# Patient Record
Sex: Female | Born: 1945
Health system: Southern US, Community
[De-identification: ages and names within clinical notes are randomized; demographics above are authoritative.]

## PROBLEM LIST (undated history)

## (undated) DIAGNOSIS — R55 Syncope and collapse: Secondary | ICD-10-CM

## (undated) DIAGNOSIS — Z8739 Personal history of other diseases of the musculoskeletal system and connective tissue: Secondary | ICD-10-CM

## (undated) DIAGNOSIS — D649 Anemia, unspecified: Secondary | ICD-10-CM

## (undated) DIAGNOSIS — K219 Gastro-esophageal reflux disease without esophagitis: Secondary | ICD-10-CM

## (undated) DIAGNOSIS — R0600 Dyspnea, unspecified: Secondary | ICD-10-CM

## (undated) DIAGNOSIS — R7303 Prediabetes: Secondary | ICD-10-CM

## (undated) DIAGNOSIS — C801 Malignant (primary) neoplasm, unspecified: Secondary | ICD-10-CM

## (undated) DIAGNOSIS — M810 Age-related osteoporosis without current pathological fracture: Secondary | ICD-10-CM

## (undated) DIAGNOSIS — J45909 Unspecified asthma, uncomplicated: Secondary | ICD-10-CM

## (undated) DIAGNOSIS — E669 Obesity, unspecified: Secondary | ICD-10-CM

## (undated) DIAGNOSIS — I10 Essential (primary) hypertension: Secondary | ICD-10-CM

## (undated) DIAGNOSIS — J449 Chronic obstructive pulmonary disease, unspecified: Secondary | ICD-10-CM

## (undated) DIAGNOSIS — C3490 Malignant neoplasm of unspecified part of unspecified bronchus or lung: Secondary | ICD-10-CM

## (undated) DIAGNOSIS — E559 Vitamin D deficiency, unspecified: Secondary | ICD-10-CM

## (undated) DIAGNOSIS — M1990 Unspecified osteoarthritis, unspecified site: Secondary | ICD-10-CM

## (undated) HISTORY — DX: Essential (primary) hypertension: I10

## (undated) HISTORY — DX: Gastro-esophageal reflux disease without esophagitis: K21.9

## (undated) HISTORY — DX: Syncope and collapse: R55

## (undated) HISTORY — PX: OOPHORECTOMY: SHX86

## (undated) HISTORY — PX: ESOPHAGOGASTRODUODENOSCOPY: SHX1529

## (undated) HISTORY — DX: Personal history of other diseases of the musculoskeletal system and connective tissue: Z87.39

## (undated) HISTORY — DX: Obesity, unspecified: E66.9

## (undated) HISTORY — DX: Age-related osteoporosis without current pathological fracture: M81.0

## (undated) HISTORY — DX: Vitamin D deficiency, unspecified: E55.9

## (undated) HISTORY — PX: ABDOMINAL HYSTERECTOMY: SHX81

## (undated) HISTORY — DX: Unspecified asthma, uncomplicated: J45.909

## (undated) HISTORY — DX: Chronic obstructive pulmonary disease, unspecified: J44.9

---

## 1997-12-14 ENCOUNTER — Other Ambulatory Visit: Admission: RE | Admit: 1997-12-14 | Discharge: 1997-12-14 | Payer: Self-pay | Admitting: Obstetrics and Gynecology

## 1998-02-12 ENCOUNTER — Emergency Department (HOSPITAL_COMMUNITY): Admission: EM | Admit: 1998-02-12 | Discharge: 1998-02-12 | Payer: Self-pay | Admitting: Emergency Medicine

## 1999-01-09 ENCOUNTER — Emergency Department (HOSPITAL_COMMUNITY): Admission: EM | Admit: 1999-01-09 | Discharge: 1999-01-09 | Payer: Self-pay | Admitting: Emergency Medicine

## 1999-08-01 ENCOUNTER — Ambulatory Visit (HOSPITAL_COMMUNITY): Admission: RE | Admit: 1999-08-01 | Discharge: 1999-08-01 | Payer: Self-pay | Admitting: Obstetrics and Gynecology

## 1999-08-01 ENCOUNTER — Encounter: Payer: Self-pay | Admitting: Obstetrics and Gynecology

## 2001-06-23 ENCOUNTER — Encounter: Admission: RE | Admit: 2001-06-23 | Discharge: 2001-06-23 | Payer: Self-pay | Admitting: Internal Medicine

## 2001-06-23 ENCOUNTER — Encounter: Payer: Self-pay | Admitting: Internal Medicine

## 2003-11-18 ENCOUNTER — Emergency Department (HOSPITAL_COMMUNITY): Admission: EM | Admit: 2003-11-18 | Discharge: 2003-11-18 | Payer: Self-pay | Admitting: Emergency Medicine

## 2004-01-16 ENCOUNTER — Ambulatory Visit (HOSPITAL_COMMUNITY): Admission: RE | Admit: 2004-01-16 | Discharge: 2004-01-16 | Payer: Self-pay | Admitting: Internal Medicine

## 2004-01-31 ENCOUNTER — Ambulatory Visit (HOSPITAL_COMMUNITY): Admission: RE | Admit: 2004-01-31 | Discharge: 2004-01-31 | Payer: Self-pay | Admitting: *Deleted

## 2004-05-06 ENCOUNTER — Ambulatory Visit (HOSPITAL_COMMUNITY): Admission: RE | Admit: 2004-05-06 | Discharge: 2004-05-06 | Payer: Self-pay | Admitting: Obstetrics and Gynecology

## 2011-12-03 ENCOUNTER — Other Ambulatory Visit: Payer: Self-pay | Admitting: Internal Medicine

## 2011-12-04 ENCOUNTER — Ambulatory Visit
Admission: RE | Admit: 2011-12-04 | Discharge: 2011-12-04 | Disposition: A | Discharge status: Home / Self Care | Payer: Medicare Other | Source: Ambulatory Visit | Attending: Internal Medicine | Admitting: Internal Medicine

## 2011-12-22 ENCOUNTER — Other Ambulatory Visit: Payer: Self-pay | Admitting: Obstetrics and Gynecology

## 2011-12-22 DIAGNOSIS — N644 Mastodynia: Secondary | ICD-10-CM

## 2011-12-29 ENCOUNTER — Ambulatory Visit
Admission: RE | Admit: 2011-12-29 | Discharge: 2011-12-29 | Disposition: A | Discharge status: Home / Self Care | Payer: Medicare Other | Source: Ambulatory Visit | Attending: Obstetrics and Gynecology | Admitting: Obstetrics and Gynecology

## 2011-12-29 DIAGNOSIS — N644 Mastodynia: Secondary | ICD-10-CM

## 2013-08-31 HISTORY — PX: COLONOSCOPY W/ BIOPSIES: SHX1374

## 2013-11-07 ENCOUNTER — Other Ambulatory Visit: Payer: Self-pay

## 2013-11-07 DIAGNOSIS — Z1231 Encounter for screening mammogram for malignant neoplasm of breast: Secondary | ICD-10-CM

## 2013-11-28 ENCOUNTER — Ambulatory Visit
Admission: RE | Admit: 2013-11-28 | Discharge: 2013-11-28 | Disposition: A | Discharge status: Home / Self Care | Payer: Medicare Other | Source: Ambulatory Visit

## 2013-11-28 DIAGNOSIS — Z1231 Encounter for screening mammogram for malignant neoplasm of breast: Secondary | ICD-10-CM

## 2013-12-06 ENCOUNTER — Other Ambulatory Visit (HOSPITAL_COMMUNITY)
Admission: RE | Admit: 2013-12-06 | Discharge: 2013-12-06 | Disposition: A | Discharge status: Home / Self Care | Payer: Medicare Other | Source: Ambulatory Visit | Attending: Gynecology | Admitting: Gynecology

## 2013-12-06 ENCOUNTER — Encounter: Payer: Self-pay | Admitting: Gynecology

## 2013-12-06 ENCOUNTER — Ambulatory Visit (INDEPENDENT_AMBULATORY_CARE_PROVIDER_SITE_OTHER): Payer: Medicare Other | Admitting: Gynecology

## 2013-12-06 VITALS — Ht 61.0 in | Wt 138.2 lb

## 2013-12-06 DIAGNOSIS — N951 Menopausal and female climacteric states: Secondary | ICD-10-CM

## 2013-12-06 DIAGNOSIS — Z1272 Encounter for screening for malignant neoplasm of vagina: Secondary | ICD-10-CM

## 2013-12-06 DIAGNOSIS — Z780 Asymptomatic menopausal state: Secondary | ICD-10-CM

## 2013-12-06 DIAGNOSIS — N952 Postmenopausal atrophic vaginitis: Secondary | ICD-10-CM

## 2013-12-06 DIAGNOSIS — N942 Vaginismus: Secondary | ICD-10-CM

## 2013-12-06 DIAGNOSIS — M810 Age-related osteoporosis without current pathological fracture: Secondary | ICD-10-CM

## 2013-12-06 DIAGNOSIS — Z1151 Encounter for screening for human papillomavirus (HPV): Secondary | ICD-10-CM | POA: Insufficient documentation

## 2013-12-06 DIAGNOSIS — Z124 Encounter for screening for malignant neoplasm of cervix: Secondary | ICD-10-CM | POA: Insufficient documentation

## 2013-12-06 MED ORDER — NONFORMULARY OR COMPOUNDED ITEM: Status: DC

## 2013-12-06 NOTE — Patient Instructions (Addendum)
Hormone Therapy At menopause, your body begins making less estrogen and progesterone hormones. This causes the body to stop having menstrual periods. This is because estrogen and progesterone hormones control your periods and menstrual cycle. A lack of estrogen may cause symptoms such as:  Hot flushes (or hot flashes).  Vaginal dryness.  Dry skin.  Loss of sex drive.  Risk of bone loss (osteoporosis). When this happens, you may choose to take hormone therapy to get back the estrogen lost during menopause. When the hormone estrogen is given alone, it is usually referred to as ET (Estrogen Therapy). When the hormone progestin is combined with estrogen, it is generally called HT (Hormone Therapy). This was formerly known as hormone replacement therapy (HRT). Your caregiver can help you make a decision on what will be best for you. The decision to use HT seems to change often as new studies are done. Many studies do not agree on the benefits of hormone replacement therapy. LIKELY BENEFITS OF HT INCLUDE PROTECTION FROM:  Hot Flushes (also called hot flashes) - A hot flush is a sudden feeling of heat that spreads over the face and body. The skin may redden like a blush. It is connected with sweats and sleep disturbance. Women going through menopause may have hot flushes a few times a month or several times per day depending on the woman.  Osteoporosis (bone loss)- Estrogen helps guard against bone loss. After menopause, a woman's bones slowly lose calcium and become weak and brittle. As a result, bones are more likely to break. The hip, wrist, and spine are affected most often. Hormone therapy can help slow bone loss after menopause. Weight bearing exercise and taking calcium with vitamin D also can help prevent bone loss. There are also medications that your caregiver can prescribe that can help prevent osteoporosis.  Vaginal Dryness - Loss of estrogen causes changes in the vagina. Its lining may  become thin and dry. These changes can cause pain and bleeding during sexual intercourse. Dryness can also lead to infections. This can cause burning and itching. (Vaginal estrogen treatment can help relieve pain, itching, and dryness.)  Urinary Tract Infections are more common after menopause because of lack of estrogen. Some women also develop urinary incontinence because of low estrogen levels in the vagina and bladder.  Possible other benefits of estrogen include a positive effect on mood and short-term memory in women. RISKS AND COMPLICATIONS  Using estrogen alone without progesterone causes the lining of the uterus to grow. This increases the risk of lining of the uterus (endometrial) cancer. Your caregiver should give another hormone called progestin if you have a uterus.  Women who take combined (estrogen and progestin) HT appear to have an increased risk of breast cancer. The risk appears to be small, but increases throughout the time that HT is taken.  Combined therapy also makes the breast tissue slightly denser which makes it harder to read mammograms (breast X-rays).  Combined, estrogen and progesterone therapy can be taken together every day, in which case there may be spotting of blood. HT therapy can be taken cyclically in which case you will have menstrual periods. Cyclically means HT is taken for a set amount of days, then not taken, then this process is repeated.  HT may increase the risk of stroke, heart attack, breast cancer and forming blood clots in your leg.  Transdermal estrogen (estrogen that is absorbed through the skin with a patch or a cream) may have more positive results with:    Cholesterol.  Blood pressure.  Blood clots. Having the following conditions may indicate you should not have HT:  Endometrial cancer.  Liver disease.  Breast cancer.  Heart disease.  History of blood clots.  Stroke. TREATMENT   If you choose to take HT and have a uterus,  usually estrogen and progestin are prescribed.  Your caregiver will help you decide the best way to take the medications.  Possible ways to take estrogen include:  Pills.  Patches.  Gels.  Sprays.  Vaginal estrogen cream, rings and tablets.  It is best to take the lowest dose possible that will help your symptoms and take them for the shortest period of time that you can.  Hormone therapy can help relieve some of the problems (symptoms) that affect women at menopause. Before making a decision about HT, talk to your caregiver about what is best for you. Be well informed and comfortable with your decisions. HOME CARE INSTRUCTIONS   Follow your caregivers advice when taking the medications.  A Pap test is done to screen for cervical cancer.  The first Pap test should be done at age 56.  Between ages 53 and 63, Pap tests are repeated every 2 years.  Beginning at age 53, you are advised to have a Pap test every 3 years as long as your past 3 Pap tests have been normal.  Some women have medical problems that increase the chance of getting cervical cancer. Talk to your caregiver about these problems. It is especially important to talk to your caregiver if a new problem develops soon after your last Pap test. In these cases, your caregiver may recommend more frequent screening and Pap tests.  The above recommendations are the same for women who have or have not gotten the vaccine for HPV (Human Papillomavirus).  If you had a hysterectomy for a problem that was not a cancer or a condition that could lead to cancer, then you no longer need Pap tests. However, even if you no longer need a Pap test, a regular exam is a good idea to make sure no other problems are starting.   If you are between ages 50 and 24, and you have had normal Pap tests going back 10 years, you no longer need Pap tests. However, even if you no longer need a Pap test, a regular exam is a good idea to make sure no  other problems are starting.   If you have had past treatment for cervical cancer or a condition that could lead to cancer, you need Pap tests and screening for cancer for at least 20 years after your treatment.  If Pap tests have been discontinued, risk factors (such as a new sexual partner) need to be re-assessed to determine if screening should be resumed.  Some women may need screenings more often if they are at high risk for cervical cancer.  Get mammograms done as per the advice of your caregiver. SEEK IMMEDIATE MEDICAL CARE IF:  You develop abnormal vaginal bleeding.  You have pain or swelling in your legs, shortness of breath, or chest pain.  You develop dizziness or headaches.  You have lumps or changes in your breasts or armpits.  You have slurred speech.  You develop weakness or numbness of your arms or legs.  You have pain, burning, or bleeding when urinating.  You develop abdominal pain. Document Released: 05/16/2003 Document Revised: 11/09/2011 Document Reviewed: 09/03/2010 [facility] Patient Information 2014 [place], [place].  Transvaginal Ultrasound Transvaginal ultrasound is  a pelvic ultrasound, using a metal probe that is placed in the vagina, to look at a women's female organs. Transvaginal ultrasound is a method of seeing inside the pelvis of a woman. The ultrasound machine sends out sound waves from the transducer (probe). These sound waves bounce off body structures (like an echo) to create a picture. The picture shows up on a monitor. It is called transvaginal because the probe is inserted into the vagina. There should be very little discomfort from the vaginal probe. This test can also be used during pregnancy. Endovaginal ultrasound is another name for a transvaginal ultrasound. In a transabdominal ultrasound, the probe is placed on the outside of the belly. This method gives pictures that are lower quality than pictures from the transvaginal  technique. Transvaginal ultrasound is used to look for problems of the female genital tract. Some such problems include:  Infertility problems.  Congenital (birth defect) malformations of the uterus and ovaries.  Tumors in the uterus.  Abnormal bleeding.  Ovarian tumors and cysts.  Abscess (inflamed tissue around pus) in the pelvis.  Unexplained abdominal or pelvic pain.  Pelvic infection. DURING PREGNANCY, TRANSVAGINAL ULTRASOUND MAY BE USED TO LOOK AT:  Normal pregnancy.  Ectopic pregnancy (pregnancy outside the uterus).  Fetal heartbeat.  Abnormalities in the pelvis, that are not seen well with transabdominal ultrasound.  Suspected twins or multiples.  Impending miscarriage.  Problems with the cervix (incompetent cervix, not able to stay closed and hold the baby).  When doing an amniocentesis (removing fluid from the pregnancy sac, for testing).  Looking for abnormalities of the baby.  Checking the growth, development, and age of the fetus.  Measuring the amount of fluid in the amniotic sac.  When doing an external version of the baby (moving baby into correct position).  Evaluating the baby for problems in high risk pregnancies (biophysical profile).  Suspected fetal demise (death). Sometimes a special ultrasound method called Saline Infusion Sonography (SIS) is used for a more accurate look at the uterus. Sterile saline (salt water) is injected into the uterus of non-pregnant patients to see the inside of the uterus better. SIS is not used on pregnant women. The vaginal probe can also assist in obtaining biopsies of abnormal areas, in draining fluid from cysts on the ovary, and in finding IUDs (intrauterine device, birth control) that cannot be located. PREPARATION FOR TEST A transvaginal ultrasound is done with the bladder empty. The transabdominal ultrasound is done with your bladder full. You may be asked to drink several glasses of water before that exam.  Sometimes, a transabdominal ultrasound is done just after a transvaginal ultrasound, to look at organs in your abdomen. PROCEDURE  You will lie down on a table, with your knees bent and your feet in foot holders. The probe is covered with a condom. A sterile lubricant is put into the vagina and on the probe. The lubricant helps transmit the sound waves and avoid irritating the vagina. Your caregiver will move the probe inside the vaginal cavity to scan the pelvic structures. A normal test will show a normal pelvis and normal contents. An abnormal test will show abnormalities of the pelvis, placenta, or baby. ABNORMAL RESULTS MAY BE DUE TO:  Growths or tumors in the:  Uterus.  Ovaries.  Vagina.  Other pelvic structures.  Non-cancerous growths of the uterus and ovaries.  Twisting of the ovary, cutting off blood supply to the ovary (ovarian torsion).  Areas of infection, including:  Pelvic inflammatory disease.  Abscess  in the pelvis.  Locating an IUD. PROBLEMS FOUND IN PREGNANT WOMEN MAY INCLUDE:  Ectopic pregnancy (pregnancy outside the uterus).  Multiple pregnancies.  Early dilation (opening) of the cervix. This may indicate an incompetent cervix and early delivery.  Impending miscarriage.  Fetal death.  Problems with the placenta, including:  Placenta has grown over the opening of the womb (placenta previa).  Placenta has separated early in the womb (placental abruption).  Placenta grows into the muscle of the uterus (placenta accreta).  Tumors of pregnancy, including gestational trophoblastic disease. This is an abnormal pregnancy, with no fetus. The uterus is filled with many grape-like cysts that could sometimes be cancerous.  Incorrect position of the fetus (breech, vertex).  Intrauterine fetal growth retardation (IUGR) (poor growth in the womb).  Fetal abnormalities or infection. RISKS AND COMPLICATIONS There are no known risks to the ultrasound procedure.  There is no X-ray used when doing an ultrasound. Document Released: 07/29/2004 Document Revised: 11/09/2011 Document Reviewed: 07/17/2009 [facility] Patient Information 2014 [place], [place]. Shingles Vaccine What You Need to Know WHAT IS SHINGLES?  Shingles is a painful skin rash, often with blisters. It is also called Herpes Zoster or just Zoster.  A shingles rash usually appears on one side of the face or body and lasts from 2 to 4 weeks. Its main symptom is pain, which can be quite severe. Other symptoms of shingles can include fever, headache, chills, and upset stomach. Very rarely, a shingles infection can lead to pneumonia, hearing problems, blindness, brain inflammation (encephalitis), or death.  For about 1 person in 5, severe pain can continue even after the rash clears up. This is called post-herpetic neuralgia.  Shingles is caused by the Varicella Zoster virus. This is the same virus that causes chickenpox. Only someone who has had a case of chickenpox or rarely, has gotten chickenpox vaccine, can get shingles. The virus stays in your body. It can reappear many years later to cause a case of shingles.  You cannot catch shingles from another person with shingles. However, a person who has never had chickenpox (or chickenpox vaccine) could get chickenpox from someone with shingles. This is not very common.  Shingles is far more common in people 2 and older than in younger people. It is also more common in people whose immune systems are weakened because of a disease such as cancer or drugs such as steroids or chemotherapy.  At least 1 million people get shingles per year in the [place]. SHINGLES VACCINE  A vaccine for shingles was licensed in 2951. In clinical trials, the vaccine reduced the risk of shingles by 50%. It can also reduce the pain in people who still get shingles after being vaccinated.  A single dose of shingles vaccine is recommended for adults 83 years of age and  older. SOME PEOPLE SHOULD NOT GET SHINGLES VACCINE OR SHOULD WAIT A person should not get shingles vaccine if he or she:  Has ever had a life-threatening allergic reaction to gelatin, the antibiotic neomycin, or any other component of shingles vaccine. Tell your caregiver if you have any severe allergies.  Has a weakened immune system because of current:  AIDS or another disease that affects the immune system.  Treatment with drugs that affect the immune system, such as prolonged use of high-dose steroids.  Cancer treatment, such as radiation or chemotherapy.  Cancer affecting the bone marrow or lymphatic system, such as leukemia or lymphoma.  Is pregnant, or might be pregnant. Women should not  become pregnant until at least 4 weeks after getting shingles vaccine. Someone with a minor illness, such as a cold, may be vaccinated. Anyone with a moderate or severe acute illness should usually wait until he or she recovers before getting the vaccine. This includes anyone with a temperature of 101.3 F (38 C) or higher. WHAT ARE THE RISKS FROM SHINGLES VACCINE?  A vaccine, like any medicine, could possibly cause serious problems, such as severe allergic reactions. However, the risk of a vaccine causing serious harm, or death, is extremely small.  No serious problems have been identified with shingles vaccine. Mild Problems  Redness, soreness, swelling, or itching at the site of the injection (about 1 person in 3).  Headache (about 1 person in 35). Like all vaccines, shingles vaccine is being closely monitored for unusual or severe problems. WHAT IF THERE IS A MODERATE OR SEVERE REACTION? What should I look for? Any unusual condition, such as a severe allergic reaction or a high fever. If a severe allergic reaction occurred, it would be within a few minutes to an hour after the shot. Signs of a serious allergic reaction can include difficulty breathing, weakness, hoarseness or wheezing, a  fast heartbeat, hives, dizziness, paleness, or swelling of the throat. What should I do?  Call your caregiver, or get the person to a caregiver right away.  Tell the caregiver what happened, the date and time it happened, and when the vaccination was given.  Ask the caregiver to report the reaction by filing a Vaccine Adverse Event Reporting System (VAERS) form. Or, you can file this report through the VAERS web site at www.vaers.SamedayNews.es or by calling 701-824-1696. VAERS does not provide medical advice. HOW CAN I LEARN MORE?  Ask your caregiver. He or she can give you the vaccine package insert or suggest other sources of information.  Contact the Centers for Disease Control and Prevention (CDC):  Call 223-680-8444 (1-800-CDC-INFO).  Visit the CDC website at http://hunter.com/ CDC Shingles Vaccine VIS (06/05/08) Document Released: 06/14/2006 Document Revised: 11/09/2011 Document Reviewed: 12/07/2012 [facility] Patient Information 2014 [place].

## 2013-12-06 NOTE — Progress Notes (Signed)
Heather Ellis 1945/12/15 [id number]   History:    68 y.o.  who was referred to our practice from her PCP as a result of the patient not having had a Pap smear in close to 10 years. Patient stated that many years ago she had a total abdominal hysterectomy but does not recall if her ovaries are removed. She did state that she was on HRT for a short time. She had a colonoscopy this year and several benign polyps were removed. Her mammogram this year was normal. Patient states that she doesn't monthly self breast examination her PCP has been treating her for osteoporosis for which she is currently on Fosamax. She has not had her shingles vaccine. She does upper from vaginal dryness and irritation although she is not sexually active.  Past medical history,surgical history, family history and social history were all reviewed and documented in the EPIC chart.  Gynecologic History No LMP recorded. Patient has had a hysterectomy. Contraception: status post hysterectomy Last Pap: Greater than 5 years. Results were: normal Last mammogram: 2015. Results were: normal  Obstetric History OB History  Gravida Para Term Preterm AB SAB TAB Ectopic Multiple Living  6 5   1 1    5     # Outcome Date GA Lbr Len/2nd Weight Sex Delivery Anes PTL Lv  6 SAB           5 PAR           4 PAR           3 PAR           2 PAR           1 PAR                ROS: A ROS was performed and pertinent positives and negatives are included in the history.  GENERAL: No fevers or chills. HEENT: No change in vision, no earache, sore throat or sinus congestion. NECK: No pain or stiffness. CARDIOVASCULAR: No chest pain or pressure. No palpitations. PULMONARY: No shortness of breath, cough or wheeze. GASTROINTESTINAL: No abdominal pain, nausea, vomiting or diarrhea, melena or bright red blood per rectum. GENITOURINARY: No urinary frequency, urgency, hesitancy or dysuria. MUSCULOSKELETAL: No joint or muscle pain, no back  pain, no recent trauma. DERMATOLOGIC: No rash, no itching, no lesions. ENDOCRINE: No polyuria, polydipsia, no heat or cold intolerance. No recent change in weight. HEMATOLOGICAL: No anemia or easy bruising or bleeding. NEUROLOGIC: No headache, seizures, numbness, tingling or weakness. PSYCHIATRIC: No depression, no loss of interest in normal activity or change in sleep pattern.     Exam: chaperone present  Ht 5\' 1"  (1.549 m)  Wt 138 lb 3.2 oz (62.687 kg)  BMI 26.13 kg/m2  Body mass index is 26.13 kg/(m^2).  General appearance : Well developed well nourished female. No acute distress HEENT: Neck supple, trachea midline, no carotid bruits, no thyroidmegaly Lungs: Clear to auscultation, no rhonchi or wheezes, or rib retractions  Heart: Regular rate and rhythm, no murmurs or gallops Breast:Examined in sitting and supine position were symmetrical in appearance, no palpable masses or tenderness,  no skin retraction, no nipple inversion, no nipple discharge, no skin discoloration, no axillary or supraclavicular lymphadenopathy Abdomen: no palpable masses or tenderness, no rebound or guarding Extremities: no edema or skin discoloration or tenderness  Pelvic:  Bartholin, Urethra, Skene Glands: Within normal limits             Vagina: No gross  lesions or discharge, atrophic changes  Cervix: Absent  Uterus  Absent  Adnexa  Without masses or tenderness  Anus and perineum  normal   Rectovaginal  normal sphincter tone without palpated masses or tenderness             Hemoccult PCP provides patient had a colonoscopy this year     Assessment/Plan:  68 y.o. female who had not had a Pap smear in over 5 years. We did discuss the new guidelines. We did do a Pap with HPV screen today. For patient vaginal atrophy she will be placed on vaginal estrogen to apply twice a week. Risks benefits and pros and cons of vaginal estrogen discussed. Patient will return back to the office in the next 1-2 weeks for an  ultrasound to better assess her adnexa to see if her ovaries are removed and also due to the fact that she had some vaginismus making the exam and complete. PCP will be drawn her blood work. She was given a requisition to visit her pharmacy to get her shingles vaccine which she say she has not had.  Note: This dictation was prepared with  Dragon/digital dictation along withSmart phrase technology. Any transcriptional errors that result from this process are unintentional.   Terrance Mass MD, 12:31 PM 12/06/2013

## 2013-12-18 ENCOUNTER — Ambulatory Visit: Payer: Medicare Other

## 2013-12-18 ENCOUNTER — Other Ambulatory Visit: Payer: Self-pay | Admitting: Gynecology

## 2013-12-18 ENCOUNTER — Encounter: Payer: Self-pay | Admitting: Gynecology

## 2013-12-18 ENCOUNTER — Ambulatory Visit (INDEPENDENT_AMBULATORY_CARE_PROVIDER_SITE_OTHER): Payer: Medicare Other | Admitting: Gynecology

## 2013-12-18 VITALS — BP 128/76

## 2013-12-18 DIAGNOSIS — R339 Retention of urine, unspecified: Secondary | ICD-10-CM

## 2013-12-18 DIAGNOSIS — N942 Vaginismus: Secondary | ICD-10-CM

## 2013-12-18 DIAGNOSIS — N952 Postmenopausal atrophic vaginitis: Secondary | ICD-10-CM

## 2013-12-18 NOTE — Progress Notes (Signed)
   68 year old who presented to the office today to discuss her ultrasound. Patient was seen as a new patient on 12/06/2013.Patient stated that many years ago she had a total abdominal hysterectomy but does not recall if her ovaries are removed. Due to incomplete pelvic examination as the patient's vaginismus and uncertainty if she ever had her ovaries removed an ultrasound was done today.  The ultrasound demonstrated absence of uterus right and left ovary not able to be identified and no apparent masses were seen in either adnexa. There was no fluid in the cul-de-sac.  Patient's recent Pap smear with HPV screening were negative. According to the guidelines she did no longer needs Pap smears. I previously prescribed her vaginal estrogen to help with her atrophy and she has not picked up the prescription yet.

## 2014-05-29 ENCOUNTER — Other Ambulatory Visit: Payer: Self-pay | Admitting: Pain Medicine

## 2014-05-29 DIAGNOSIS — M5442 Lumbago with sciatica, left side: Secondary | ICD-10-CM

## 2014-05-30 ENCOUNTER — Ambulatory Visit
Admission: RE | Admit: 2014-05-30 | Discharge: 2014-05-30 | Disposition: A | Discharge status: Home / Self Care | Payer: Medicare Other | Source: Ambulatory Visit | Attending: Pain Medicine | Admitting: Pain Medicine

## 2014-05-30 DIAGNOSIS — M5442 Lumbago with sciatica, left side: Secondary | ICD-10-CM

## 2014-07-02 ENCOUNTER — Encounter: Payer: Self-pay | Admitting: Gynecology

## 2014-10-04 DIAGNOSIS — E559 Vitamin D deficiency, unspecified: Secondary | ICD-10-CM | POA: Diagnosis not present

## 2014-10-04 DIAGNOSIS — M5032 Other cervical disc degeneration, mid-cervical region: Secondary | ICD-10-CM | POA: Diagnosis not present

## 2014-10-04 DIAGNOSIS — F1120 Opioid dependence, uncomplicated: Secondary | ICD-10-CM | POA: Diagnosis not present

## 2014-10-04 DIAGNOSIS — M25511 Pain in right shoulder: Secondary | ICD-10-CM | POA: Diagnosis not present

## 2014-10-04 DIAGNOSIS — R739 Hyperglycemia, unspecified: Secondary | ICD-10-CM | POA: Diagnosis not present

## 2014-10-04 DIAGNOSIS — M47812 Spondylosis without myelopathy or radiculopathy, cervical region: Secondary | ICD-10-CM | POA: Diagnosis not present

## 2014-10-04 DIAGNOSIS — I10 Essential (primary) hypertension: Secondary | ICD-10-CM | POA: Diagnosis not present

## 2014-10-04 DIAGNOSIS — Z5181 Encounter for therapeutic drug level monitoring: Secondary | ICD-10-CM | POA: Diagnosis not present

## 2014-10-08 ENCOUNTER — Other Ambulatory Visit: Payer: Self-pay | Admitting: Internal Medicine

## 2014-10-08 DIAGNOSIS — M542 Cervicalgia: Secondary | ICD-10-CM

## 2014-10-18 ENCOUNTER — Ambulatory Visit
Admission: RE | Admit: 2014-10-18 | Discharge: 2014-10-18 | Disposition: A | Discharge status: Home / Self Care | Payer: Medicare Other | Source: Ambulatory Visit | Attending: Internal Medicine | Admitting: Internal Medicine

## 2014-10-18 DIAGNOSIS — M542 Cervicalgia: Secondary | ICD-10-CM

## 2014-10-18 DIAGNOSIS — M47812 Spondylosis without myelopathy or radiculopathy, cervical region: Secondary | ICD-10-CM | POA: Diagnosis not present

## 2014-10-18 DIAGNOSIS — M4802 Spinal stenosis, cervical region: Secondary | ICD-10-CM | POA: Diagnosis not present

## 2014-10-22 DIAGNOSIS — M47812 Spondylosis without myelopathy or radiculopathy, cervical region: Secondary | ICD-10-CM | POA: Diagnosis not present

## 2014-10-24 DIAGNOSIS — M542 Cervicalgia: Secondary | ICD-10-CM | POA: Diagnosis not present

## 2014-10-24 DIAGNOSIS — M47892 Other spondylosis, cervical region: Secondary | ICD-10-CM | POA: Diagnosis not present

## 2014-10-24 DIAGNOSIS — R269 Unspecified abnormalities of gait and mobility: Secondary | ICD-10-CM | POA: Diagnosis not present

## 2014-10-29 DIAGNOSIS — M47892 Other spondylosis, cervical region: Secondary | ICD-10-CM | POA: Diagnosis not present

## 2014-10-29 DIAGNOSIS — M542 Cervicalgia: Secondary | ICD-10-CM | POA: Diagnosis not present

## 2014-10-29 DIAGNOSIS — R269 Unspecified abnormalities of gait and mobility: Secondary | ICD-10-CM | POA: Diagnosis not present

## 2014-10-31 DIAGNOSIS — R269 Unspecified abnormalities of gait and mobility: Secondary | ICD-10-CM | POA: Diagnosis not present

## 2014-10-31 DIAGNOSIS — M47892 Other spondylosis, cervical region: Secondary | ICD-10-CM | POA: Diagnosis not present

## 2014-10-31 DIAGNOSIS — M542 Cervicalgia: Secondary | ICD-10-CM | POA: Diagnosis not present

## 2014-11-02 DIAGNOSIS — R269 Unspecified abnormalities of gait and mobility: Secondary | ICD-10-CM | POA: Diagnosis not present

## 2014-11-02 DIAGNOSIS — M542 Cervicalgia: Secondary | ICD-10-CM | POA: Diagnosis not present

## 2014-11-02 DIAGNOSIS — M47892 Other spondylosis, cervical region: Secondary | ICD-10-CM | POA: Diagnosis not present

## 2014-11-05 DIAGNOSIS — E559 Vitamin D deficiency, unspecified: Secondary | ICD-10-CM | POA: Diagnosis not present

## 2014-11-05 DIAGNOSIS — I10 Essential (primary) hypertension: Secondary | ICD-10-CM | POA: Diagnosis not present

## 2014-11-05 DIAGNOSIS — S46811A Strain of other muscles, fascia and tendons at shoulder and upper arm level, right arm, initial encounter: Secondary | ICD-10-CM | POA: Diagnosis not present

## 2014-11-06 DIAGNOSIS — M47892 Other spondylosis, cervical region: Secondary | ICD-10-CM | POA: Diagnosis not present

## 2014-11-06 DIAGNOSIS — M542 Cervicalgia: Secondary | ICD-10-CM | POA: Diagnosis not present

## 2014-11-06 DIAGNOSIS — R269 Unspecified abnormalities of gait and mobility: Secondary | ICD-10-CM | POA: Diagnosis not present

## 2014-11-09 DIAGNOSIS — M542 Cervicalgia: Secondary | ICD-10-CM | POA: Diagnosis not present

## 2014-11-09 DIAGNOSIS — R269 Unspecified abnormalities of gait and mobility: Secondary | ICD-10-CM | POA: Diagnosis not present

## 2014-11-09 DIAGNOSIS — M47892 Other spondylosis, cervical region: Secondary | ICD-10-CM | POA: Diagnosis not present

## 2014-11-14 DIAGNOSIS — R269 Unspecified abnormalities of gait and mobility: Secondary | ICD-10-CM | POA: Diagnosis not present

## 2014-11-14 DIAGNOSIS — M47892 Other spondylosis, cervical region: Secondary | ICD-10-CM | POA: Diagnosis not present

## 2014-11-14 DIAGNOSIS — M542 Cervicalgia: Secondary | ICD-10-CM | POA: Diagnosis not present

## 2014-11-16 DIAGNOSIS — R269 Unspecified abnormalities of gait and mobility: Secondary | ICD-10-CM | POA: Diagnosis not present

## 2014-11-16 DIAGNOSIS — M47892 Other spondylosis, cervical region: Secondary | ICD-10-CM | POA: Diagnosis not present

## 2014-11-16 DIAGNOSIS — M542 Cervicalgia: Secondary | ICD-10-CM | POA: Diagnosis not present

## 2014-11-19 DIAGNOSIS — M47892 Other spondylosis, cervical region: Secondary | ICD-10-CM | POA: Diagnosis not present

## 2014-11-20 DIAGNOSIS — R269 Unspecified abnormalities of gait and mobility: Secondary | ICD-10-CM | POA: Diagnosis not present

## 2014-11-20 DIAGNOSIS — M542 Cervicalgia: Secondary | ICD-10-CM | POA: Diagnosis not present

## 2014-11-20 DIAGNOSIS — M47892 Other spondylosis, cervical region: Secondary | ICD-10-CM | POA: Diagnosis not present

## 2014-11-22 DIAGNOSIS — R269 Unspecified abnormalities of gait and mobility: Secondary | ICD-10-CM | POA: Diagnosis not present

## 2014-11-22 DIAGNOSIS — M47892 Other spondylosis, cervical region: Secondary | ICD-10-CM | POA: Diagnosis not present

## 2014-11-22 DIAGNOSIS — M542 Cervicalgia: Secondary | ICD-10-CM | POA: Diagnosis not present

## 2015-03-19 ENCOUNTER — Encounter: Payer: Self-pay | Admitting: Gynecology

## 2015-03-19 ENCOUNTER — Ambulatory Visit (INDEPENDENT_AMBULATORY_CARE_PROVIDER_SITE_OTHER): Payer: Medicare Other | Admitting: Gynecology

## 2015-03-19 VITALS — BP 132/80

## 2015-03-19 DIAGNOSIS — R1030 Lower abdominal pain, unspecified: Secondary | ICD-10-CM | POA: Diagnosis not present

## 2015-03-19 LAB — URINALYSIS W MICROSCOPIC + REFLEX CULTURE
Bilirubin Urine: NEGATIVE
CRYSTALS: NONE SEEN
Glucose, UA: NEGATIVE mg/dL
Ketones, ur: NEGATIVE mg/dL
LEUKOCYTES UA: NEGATIVE
Nitrite: NEGATIVE
PH: 5 (ref 5.0–8.0)
PROTEIN: NEGATIVE mg/dL
Specific Gravity, Urine: >1.03 — ABNORMAL HIGH (ref 1.005–1.030)
Urobilinogen, UA: 0.2 mg/dL (ref 0.0–1.0)

## 2015-03-19 NOTE — Progress Notes (Signed)
   Patient is a 69 year old who presented to the office today with a complaint of the past few days of low abdominal discomfort at times radiating to her leg. Patient denied any fever, chills, nausea, or vomiting. Patient denied any vaginal discharge. No GU or GI complaints. Patient several years ago for menorrhagia and fibroids had a TAH/BSO. Patient on no hormone replacement therapy. Her PCP is treating her for osteoporosis with Fosamax which she has been on for many years but does not recall call long.  Exam: Blood pressure 132/80 Gen. appearance African-American female in no acute distress with above-mentioned concern. Back: No CVA tenderness Abdomen: Soft nontender no rebound or guarding Pelvic: Bartholin urethra Skene glands within normal limits atrophic changes Vagina: No lesions on inspection vaginal cuff intact atrophic changes Bimanual exam unremarkable rectal exam unremarkable  Urinalysis: Few bacteria, 7-10 RBC culture submitted  Assessment/plan: Nonspecific low abdominal discomfort on and off colonoscopy done last year 2 benign polyps removed. Prior history of total abdominal hysterectomy with bilateral salpingo-oophorectomy and no GU or GI complaints. I have asked her to check with her PCP next week at time of her annual exam for him to submit to our office a copy of her bone density study to update our records. Also he may want to see if she needs to go on a drug holiday because some of the side effects she may be experiencing may be attributed to the Fosamax. Urine culture pending.

## 2015-03-21 LAB — URINE CULTURE
Colony Count: NO GROWTH
Organism ID, Bacteria: NO GROWTH

## 2015-03-28 DIAGNOSIS — Z0001 Encounter for general adult medical examination with abnormal findings: Secondary | ICD-10-CM | POA: Diagnosis not present

## 2015-03-28 DIAGNOSIS — E559 Vitamin D deficiency, unspecified: Secondary | ICD-10-CM | POA: Diagnosis not present

## 2015-03-28 DIAGNOSIS — I10 Essential (primary) hypertension: Secondary | ICD-10-CM | POA: Diagnosis not present

## 2015-04-03 ENCOUNTER — Other Ambulatory Visit: Payer: Self-pay

## 2015-04-03 DIAGNOSIS — K219 Gastro-esophageal reflux disease without esophagitis: Secondary | ICD-10-CM | POA: Diagnosis not present

## 2015-04-03 DIAGNOSIS — Z1231 Encounter for screening mammogram for malignant neoplasm of breast: Secondary | ICD-10-CM

## 2015-04-03 DIAGNOSIS — I10 Essential (primary) hypertension: Secondary | ICD-10-CM | POA: Diagnosis not present

## 2015-04-03 DIAGNOSIS — E559 Vitamin D deficiency, unspecified: Secondary | ICD-10-CM | POA: Diagnosis not present

## 2015-04-03 DIAGNOSIS — J449 Chronic obstructive pulmonary disease, unspecified: Secondary | ICD-10-CM | POA: Diagnosis not present

## 2015-04-09 ENCOUNTER — Ambulatory Visit
Admission: RE | Admit: 2015-04-09 | Discharge: 2015-04-09 | Disposition: A | Discharge status: Home / Self Care | Payer: Medicare Other | Source: Ambulatory Visit

## 2015-04-09 DIAGNOSIS — Z1231 Encounter for screening mammogram for malignant neoplasm of breast: Secondary | ICD-10-CM

## 2015-04-25 ENCOUNTER — Encounter: Payer: Self-pay | Admitting: Gynecology

## 2015-04-25 ENCOUNTER — Ambulatory Visit (INDEPENDENT_AMBULATORY_CARE_PROVIDER_SITE_OTHER): Payer: Medicare Other | Admitting: Gynecology

## 2015-04-25 VITALS — BP 124/80 | Ht 60.0 in | Wt 146.0 lb

## 2015-04-25 DIAGNOSIS — M810 Age-related osteoporosis without current pathological fracture: Secondary | ICD-10-CM | POA: Diagnosis not present

## 2015-04-25 DIAGNOSIS — Z01419 Encounter for gynecological examination (general) (routine) without abnormal findings: Secondary | ICD-10-CM | POA: Diagnosis not present

## 2015-04-25 NOTE — Patient Instructions (Signed)
Polyethylene Glycol powder What is this medicine? POLYETHYLENE GLYCOL 3350 (pol ee ETH i leen; GLYE col) powder is a laxative used to treat constipation. It increases the amount of water in the stool. Bowel movements become easier and more frequent. This medicine may be used for other purposes; ask your health care provider or pharmacist if you have questions. COMMON BRAND NAME(S): GaviLax, GlycoLax, MiraLax, [employer] What should I tell my health care provider before I take this medicine? They need to know if you have any of these conditions: -a history of blockage of the stomach or intestine -current abdomen distension or pain -difficulty swallowing -diverticulitis, ulcerative colitis, or other chronic bowel disease -phenylketonuria -an unusual or allergic reaction to polyethylene glycol, other medicines, dyes, or preservatives -pregnant or trying to get pregnant -breast-feeding How should I use this medicine? Take this medicine by mouth. The bottle has a measuring cap that is marked with a line. Pour the powder into the cap up to the marked line (the dose is about 1 heaping tablespoon). Add the powder in the cap to a full glass (4 to 8 ounces or 120 to 240 ml) of water, juice, soda, coffee or tea. Mix the powder well. Drink the solution. Take exactly as directed. Do not take your medicine more often than directed. Talk to your pediatrician regarding the use of this medicine in children. Special care may be needed. Overdosage: If you think you have taken too much of this medicine contact a poison control center or emergency room at once. NOTE: This medicine is only for you. Do not share this medicine with others. What if I miss a dose? If you miss a dose, take it as soon as you can. If it is almost time for your next dose, take only that dose. Do not take double or extra doses. What may interact with this medicine? Interactions are not expected. This list may not describe all possible  interactions. Give your health care provider a list of all the medicines, herbs, non-prescription drugs, or dietary supplements you use. Also tell them if you smoke, drink alcohol, or use illegal drugs. Some items may interact with your medicine. What should I watch for while using this medicine? Do not use for more than 2 weeks without advice from your doctor or health care professional. It can take 2 to 4 days to have a bowel movement and to experience improvement in constipation. See your health care professional for any changes in bowel habits, including constipation, that are severe or last longer than three weeks. Always take this medicine with plenty of water. What side effects may I notice from receiving this medicine? Side effects that you should report to your doctor or health care professional as soon as possible: -diarrhea -difficulty breathing -itching of the skin, hives, or skin rash -severe bloating, pain, or distension of the stomach -vomiting Side effects that usually do not require medical attention (report to your doctor or health care professional if they continue or are bothersome): -bloating or gas -lower abdominal discomfort or cramps -nausea This list may not describe all possible side effects. Call your doctor for medical advice about side effects. You may report side effects to FDA at 1-800-FDA-1088. Where should I keep my medicine? Keep out of the reach of children. Store between 15 and 30 degrees C (59 and 86 degrees F). Throw away any unused medicine after the expiration date. NOTE: This sheet is a summary. It may not cover all possible information. If  you have questions about this medicine, talk to your doctor, pharmacist, or health care provider.  2015, Elsevier/Gold Standard. (2008-03-19 16:50:45) About Constipation  Constipation Overview Constipation is the most common gastrointestinal complaint - about 4 million Americans experience constipation and make 2.5  million physician visits a year to get help for the problem.  Constipation can occur when the colon absorbs too much water, the colon's muscle contraction is slow or sluggish, and/or there is delayed transit time through the colon.  The result is stool that is hard and dry.  Indicators of constipation include straining during bowel movements greater than 25% of the time, having fewer than three bowel movements per week, and/or the feeling of incomplete evacuation.  There are established guidelines ([place] II ) for defining constipation. A person needs to have two or more of the following symptoms for at least 12 weeks (not necessarily consecutive) in the preceding 12 months: . Straining in  greater than 25% of bowel movements . Lumpy or hard stools in greater than 25% of bowel movements . Sensation of incomplete emptying in greater than 25% of bowel movements . Sensation of anorectal obstruction/blockade in greater than 25% of bowel movements . Manual maneuvers to help empty greater than 25% of bowel movements (e.g., digital evacuation, support of the pelvic floor)  . Less than  3 bowel movements/week . Loose stools are not present, and criteria for irritable bowel syndrome are insufficient  Common Causes of Constipation . Lack of fiber in your diet . Lack of physical activity . Medications, including iron and calcium supplements  . Dairy intake . Dehydration . Abuse of laxatives  Travel  Irritable Bowel Syndrome  Pregnancy  Luteal phase of menstruation (after ovulation and before menses)  Colorectal problems  Intestinal Dysfunction  Treating Constipation  There are several ways of treating constipation, including changes to diet and exercise, use of laxatives, adjustments to the pelvic floor, and scheduled toileting.  These treatments include: . increasing fiber and fluids in the diet  . increasing physical activity . learning muscle coordination   learning proper toileting  techniques and toileting modifications   designing and sticking  to a toileting schedule     2007, [employer] Doc.22

## 2015-04-25 NOTE — Progress Notes (Signed)
Heather Ellis [birth year] [id number]   History:    69 y.o.  for annual gyn exam with no complaints today. Patient was seen in the office as a new patient for the first time in April 2015. Her primary care physician had referred her to our practice and she had not had a gynecological examination over 10 years. Review of her record and indicated that she has a past history of a total abdominal hysterectomy but did not recall if her ovaries were removed.She did state that she was on HRT for a short time. She had a colonoscopy several years ago and reports polyps having been removed. She does suffer time from constipation but reports no rectal bleeding. Her primary care physician has been monitoring her bone density studies and she's currently on Fosamax. She states her vaccines are up-to-date. She has not had the shingles vaccine. She is not sexually active.  Past medical history,surgical history, family history and social history were all reviewed and documented in the EPIC chart.  Gynecologic History No LMP recorded. Patient has had a hysterectomy. Contraception: post menopausal status Last Pap: 2015. Results were: normal Last mammogram: 2016. Results were: normal  Obstetric History OB History  Gravida Para Term Preterm AB SAB TAB Ectopic Multiple Living  '6 5   1 1    5    '$ # Outcome Date GA Lbr Len/2nd Weight Sex Delivery Anes PTL Lv  6 SAB           5 Para           4 Para           3 Para           2 Para           1 Para                ROS: A ROS was performed and pertinent positives and negatives are included in the history.  GENERAL: No fevers or chills. HEENT: No change in vision, no earache, sore throat or sinus congestion. NECK: No pain or stiffness. CARDIOVASCULAR: No chest pain or pressure. No palpitations. PULMONARY: No shortness of breath, cough or wheeze. GASTROINTESTINAL: No abdominal pain, nausea, vomiting or diarrhea, melena or bright red blood per rectum.  GENITOURINARY: No urinary frequency, urgency, hesitancy or dysuria. MUSCULOSKELETAL: No joint or muscle pain, no back pain, no recent trauma. DERMATOLOGIC: No rash, no itching, no lesions. ENDOCRINE: No polyuria, polydipsia, no heat or cold intolerance. No recent change in weight. HEMATOLOGICAL: No anemia or easy bruising or bleeding. NEUROLOGIC: No headache, seizures, numbness, tingling or weakness. PSYCHIATRIC: No depression, no loss of interest in normal activity or change in sleep pattern.     Exam: chaperone present  BP 124/80 mmHg  Ht 5' (1.524 m)  Wt 146 lb (66.225 kg)  BMI 28.51 kg/m2  Body mass index is 28.51 kg/(m^2).  General appearance : Well developed well nourished female. No acute distress HEENT: Eyes: no retinal hemorrhage or exudates,  Neck supple, trachea midline, no carotid bruits, no thyroidmegaly Lungs: Clear to auscultation, no rhonchi or wheezes, or rib retractions  Heart: Regular rate and rhythm, no murmurs or gallops Breast:Examined in sitting and supine position were symmetrical in appearance, no palpable masses or tenderness,  no skin retraction, no nipple inversion, no nipple discharge, no skin discoloration, no axillary or supraclavicular lymphadenopathy Abdomen: no palpable masses or tenderness, no rebound or guarding Extremities: no edema or skin discoloration or tenderness  Pelvic:  Bartholin, Urethra, Skene Glands: Within normal limits             Vagina: No gross lesions or discharge, vaginal atrophy  Cervix: Absent  Uterus  absent  Adnexa  Without masses or tenderness  Anus and perineum  normal   Rectovaginal  normal sphincter tone without palpated masses or tenderness             Hemoccult PCP provides     Assessment/Plan:  69 y.o. female for annual exam with vaginal atrophy asymptomatic not sexually active. Patient with history of osteoporosis currently on Fosamax being monitored by her PCP. She will need to check with him women's her next bone  density due as well as her next colonoscopy. For constipation of given her samples of MiraLAX and recommend she take a tablespoon with her juice every morning. We discussed importance of calcium vitamin D and regular exercise. Pap smear not indicated according to the new guidelines. PCP we'll be doing her blood work.   Terrance Mass MD, 11:16 AM 04/25/2015

## 2015-08-13 DIAGNOSIS — H04129 Dry eye syndrome of unspecified lacrimal gland: Secondary | ICD-10-CM | POA: Diagnosis not present

## 2015-08-13 DIAGNOSIS — H18413 Arcus senilis, bilateral: Secondary | ICD-10-CM | POA: Diagnosis not present

## 2015-08-13 DIAGNOSIS — Z961 Presence of intraocular lens: Secondary | ICD-10-CM | POA: Diagnosis not present

## 2015-10-04 DIAGNOSIS — I10 Essential (primary) hypertension: Secondary | ICD-10-CM | POA: Diagnosis not present

## 2015-10-04 DIAGNOSIS — G8929 Other chronic pain: Secondary | ICD-10-CM | POA: Diagnosis not present

## 2015-10-04 DIAGNOSIS — M5440 Lumbago with sciatica, unspecified side: Secondary | ICD-10-CM | POA: Diagnosis not present

## 2015-10-04 DIAGNOSIS — K219 Gastro-esophageal reflux disease without esophagitis: Secondary | ICD-10-CM | POA: Diagnosis not present

## 2016-03-09 ENCOUNTER — Other Ambulatory Visit: Payer: Self-pay | Admitting: Internal Medicine

## 2016-03-09 DIAGNOSIS — Z1231 Encounter for screening mammogram for malignant neoplasm of breast: Secondary | ICD-10-CM

## 2016-03-26 DIAGNOSIS — E559 Vitamin D deficiency, unspecified: Secondary | ICD-10-CM | POA: Diagnosis not present

## 2016-03-26 DIAGNOSIS — Z0000 Encounter for general adult medical examination without abnormal findings: Secondary | ICD-10-CM | POA: Diagnosis not present

## 2016-03-26 DIAGNOSIS — I10 Essential (primary) hypertension: Secondary | ICD-10-CM | POA: Diagnosis not present

## 2016-03-26 DIAGNOSIS — M810 Age-related osteoporosis without current pathological fracture: Secondary | ICD-10-CM | POA: Diagnosis not present

## 2016-04-02 DIAGNOSIS — I10 Essential (primary) hypertension: Secondary | ICD-10-CM | POA: Diagnosis not present

## 2016-04-02 DIAGNOSIS — K219 Gastro-esophageal reflux disease without esophagitis: Secondary | ICD-10-CM | POA: Diagnosis not present

## 2016-04-02 DIAGNOSIS — J45909 Unspecified asthma, uncomplicated: Secondary | ICD-10-CM | POA: Diagnosis not present

## 2016-04-02 DIAGNOSIS — R0600 Dyspnea, unspecified: Secondary | ICD-10-CM | POA: Diagnosis not present

## 2016-04-08 DIAGNOSIS — H6121 Impacted cerumen, right ear: Secondary | ICD-10-CM | POA: Diagnosis not present

## 2016-04-08 DIAGNOSIS — I10 Essential (primary) hypertension: Secondary | ICD-10-CM | POA: Diagnosis not present

## 2016-04-08 DIAGNOSIS — R739 Hyperglycemia, unspecified: Secondary | ICD-10-CM | POA: Diagnosis not present

## 2016-04-09 ENCOUNTER — Ambulatory Visit
Admission: RE | Admit: 2016-04-09 | Discharge: 2016-04-09 | Disposition: A | Discharge status: Home / Self Care | Payer: Medicare Other | Source: Ambulatory Visit | Attending: Internal Medicine | Admitting: Internal Medicine

## 2016-04-09 DIAGNOSIS — Z1231 Encounter for screening mammogram for malignant neoplasm of breast: Secondary | ICD-10-CM

## 2016-04-27 ENCOUNTER — Ambulatory Visit (INDEPENDENT_AMBULATORY_CARE_PROVIDER_SITE_OTHER): Payer: Medicare Other | Admitting: Gynecology

## 2016-04-27 ENCOUNTER — Encounter: Payer: Self-pay | Admitting: Gynecology

## 2016-04-27 VITALS — BP 138/86 | Ht 60.0 in | Wt 158.0 lb

## 2016-04-27 DIAGNOSIS — Z01419 Encounter for gynecological examination (general) (routine) without abnormal findings: Secondary | ICD-10-CM

## 2016-04-27 DIAGNOSIS — N952 Postmenopausal atrophic vaginitis: Secondary | ICD-10-CM

## 2016-04-27 DIAGNOSIS — M810 Age-related osteoporosis without current pathological fracture: Secondary | ICD-10-CM | POA: Diagnosis not present

## 2016-04-27 DIAGNOSIS — Z79890 Hormone replacement therapy (postmenopausal): Secondary | ICD-10-CM

## 2016-04-27 MED ORDER — NONFORMULARY OR COMPOUNDED ITEM: 4 refills | Status: DC

## 2016-04-27 NOTE — Patient Instructions (Signed)

## 2016-04-27 NOTE — Progress Notes (Signed)
Heather Ellis 03/18/1946 [id number]   History:    70 y.o.  for annual exam today with no complaints.Patient was seen in the office as a new patient for the first time in April 2015. Her primary care physician had referred her to our practice and she had not had a gynecological examination over 10 years. Review of her record and indicated that she has a past history of a total abdominal hysterectomy but did not recall if her ovaries were removed.She did state that she was on HRT for a short time. She had a colonoscopy  in 2015 benign polyps were removed. She does state that her PCP has been ordering her bone density study she does not recall when was her last study. She does have history of osteoporosis for which she is currently on Fosamax 70 mg weekly. Patient states her vaccines are all up-to-date. Patient not sexually active. Patient has done well with a vaginal estrogen twice a week for vaginal atrophy.  Past medical history,surgical history, family history and social history were all reviewed and documented in the EPIC chart.  Gynecologic History No LMP recorded. Patient has had a hysterectomy. Contraception: post menopausal status Last Pap: Several years ago. Results were: normal Last mammogram: 2017. Results were: normal  Obstetric History OB History  Gravida Para Term Preterm AB Living  '6 5     1 5  '$ SAB TAB Ectopic Multiple Live Births  1            # Outcome Date GA Lbr Len/2nd Weight Sex Delivery Anes PTL Lv  6 SAB           5 Para           4 Para           3 Para           2 Para           1 Para                ROS: A ROS was performed and pertinent positives and negatives are included in the history.  GENERAL: No fevers or chills. HEENT: No change in vision, no earache, sore throat or sinus congestion. NECK: No pain or stiffness. CARDIOVASCULAR: No chest pain or pressure. No palpitations. PULMONARY: No shortness of breath, cough or wheeze. GASTROINTESTINAL: No  abdominal pain, nausea, vomiting or diarrhea, melena or bright red blood per rectum. GENITOURINARY: No urinary frequency, urgency, hesitancy or dysuria. MUSCULOSKELETAL: No joint or muscle pain, no back pain, no recent trauma. DERMATOLOGIC: No rash, no itching, no lesions. ENDOCRINE: No polyuria, polydipsia, no heat or cold intolerance. No recent change in weight. HEMATOLOGICAL: No anemia or easy bruising or bleeding. NEUROLOGIC: No headache, seizures, numbness, tingling or weakness. PSYCHIATRIC: No depression, no loss of interest in normal activity or change in sleep pattern.     Exam: chaperone present  BP 138/86   Ht 5' (1.524 m)   Wt 158 lb (71.7 kg)   BMI 30.86 kg/m   Body mass index is 30.86 kg/m.  General appearance : Well developed well nourished female. No acute distress HEENT: Eyes: no retinal hemorrhage or exudates,  Neck supple, trachea midline, no carotid bruits, no thyroidmegaly Lungs: Clear to auscultation, no rhonchi or wheezes, or rib retractions  Heart: Regular rate and rhythm, no murmurs or gallops Breast:Examined in sitting and supine position were symmetrical in appearance, no palpable masses or tenderness,  no skin retraction, no nipple inversion,  no nipple discharge, no skin discoloration, no axillary or supraclavicular lymphadenopathy Abdomen: no palpable masses or tenderness, no rebound or guarding Extremities: no edema or skin discoloration or tenderness  Pelvic:  Bartholin, Urethra, Skene Glands: Within normal limits             Vagina: No gross lesions or discharge, atrophic changes  Cervix: Absent  Uterus  absent  Adnexa  Without masses or tenderness  Anus and perineum  normal   Rectovaginal  normal sphincter tone without palpated masses or tenderness             Hemoccult PCP provides     Assessment/Plan:  70 y.o. female for annual exam with history of osteoporosis currently on Fosamax 70 mg every weekly. I have asked her to obtain a copy of her bone  density study from her PCP so that we can update our records. Discussed importance of calcium vitamin D and weightbearing exercises. Patient was given a prescription refill for her vaginal estrogen twice a week to help with her vaginal atrophy. Pap smear no longer indicated.   Terrance Mass MD, 10:47 AM 04/27/2016

## 2016-06-20 DIAGNOSIS — M546 Pain in thoracic spine: Secondary | ICD-10-CM | POA: Diagnosis not present

## 2016-06-29 DIAGNOSIS — J45909 Unspecified asthma, uncomplicated: Secondary | ICD-10-CM | POA: Diagnosis not present

## 2016-06-29 DIAGNOSIS — I10 Essential (primary) hypertension: Secondary | ICD-10-CM | POA: Diagnosis not present

## 2016-06-29 DIAGNOSIS — S2241XD Multiple fractures of ribs, right side, subsequent encounter for fracture with routine healing: Secondary | ICD-10-CM | POA: Diagnosis not present

## 2016-08-03 DIAGNOSIS — R739 Hyperglycemia, unspecified: Secondary | ICD-10-CM | POA: Diagnosis not present

## 2016-08-03 DIAGNOSIS — I10 Essential (primary) hypertension: Secondary | ICD-10-CM | POA: Diagnosis not present

## 2016-08-06 DIAGNOSIS — I10 Essential (primary) hypertension: Secondary | ICD-10-CM | POA: Diagnosis not present

## 2016-08-06 DIAGNOSIS — K219 Gastro-esophageal reflux disease without esophagitis: Secondary | ICD-10-CM | POA: Diagnosis not present

## 2016-08-06 DIAGNOSIS — Z23 Encounter for immunization: Secondary | ICD-10-CM | POA: Diagnosis not present

## 2016-08-06 DIAGNOSIS — M810 Age-related osteoporosis without current pathological fracture: Secondary | ICD-10-CM | POA: Diagnosis not present

## 2016-08-06 DIAGNOSIS — G8929 Other chronic pain: Secondary | ICD-10-CM | POA: Diagnosis not present

## 2016-08-07 LAB — GLUCOSE, POCT (MANUAL RESULT ENTRY): POC GLUCOSE: 90 mg/dL (ref 70–99)

## 2016-09-03 DIAGNOSIS — M25552 Pain in left hip: Secondary | ICD-10-CM | POA: Diagnosis not present

## 2016-09-03 DIAGNOSIS — M810 Age-related osteoporosis without current pathological fracture: Secondary | ICD-10-CM | POA: Diagnosis not present

## 2016-09-03 DIAGNOSIS — Z0000 Encounter for general adult medical examination without abnormal findings: Secondary | ICD-10-CM | POA: Diagnosis not present

## 2016-09-09 DIAGNOSIS — M25559 Pain in unspecified hip: Secondary | ICD-10-CM | POA: Diagnosis not present

## 2016-09-09 DIAGNOSIS — M25511 Pain in right shoulder: Secondary | ICD-10-CM | POA: Diagnosis not present

## 2016-09-09 DIAGNOSIS — M791 Myalgia: Secondary | ICD-10-CM | POA: Diagnosis not present

## 2016-09-09 DIAGNOSIS — M545 Low back pain: Secondary | ICD-10-CM | POA: Diagnosis not present

## 2016-09-11 DIAGNOSIS — M1990 Unspecified osteoarthritis, unspecified site: Secondary | ICD-10-CM | POA: Diagnosis not present

## 2016-09-11 DIAGNOSIS — M545 Low back pain: Secondary | ICD-10-CM | POA: Diagnosis not present

## 2016-09-11 DIAGNOSIS — M25511 Pain in right shoulder: Secondary | ICD-10-CM | POA: Diagnosis not present

## 2016-09-11 DIAGNOSIS — M25559 Pain in unspecified hip: Secondary | ICD-10-CM | POA: Diagnosis not present

## 2016-11-30 DIAGNOSIS — I10 Essential (primary) hypertension: Secondary | ICD-10-CM | POA: Diagnosis not present

## 2016-11-30 DIAGNOSIS — K5900 Constipation, unspecified: Secondary | ICD-10-CM | POA: Diagnosis not present

## 2016-11-30 DIAGNOSIS — Z86010 Personal history of colonic polyps: Secondary | ICD-10-CM | POA: Diagnosis not present

## 2017-01-13 ENCOUNTER — Encounter: Payer: Self-pay | Admitting: Gynecology

## 2017-01-24 ENCOUNTER — Encounter (HOSPITAL_COMMUNITY): Payer: Self-pay | Admitting: Emergency Medicine

## 2017-01-24 ENCOUNTER — Ambulatory Visit (HOSPITAL_COMMUNITY)
Admission: EM | Admit: 2017-01-24 | Discharge: 2017-01-24 | Disposition: A | Discharge status: Home / Self Care | Payer: Medicare Other | Attending: Family Medicine | Admitting: Family Medicine

## 2017-01-24 DIAGNOSIS — M8949 Other hypertrophic osteoarthropathy, multiple sites: Secondary | ICD-10-CM

## 2017-01-24 DIAGNOSIS — M159 Polyosteoarthritis, unspecified: Secondary | ICD-10-CM

## 2017-01-24 DIAGNOSIS — M150 Primary generalized (osteo)arthritis: Secondary | ICD-10-CM

## 2017-01-24 MED ORDER — PREDNISONE 20 MG PO TABS: ORAL_TABLET | ORAL | 0 refills | Status: DC

## 2017-01-24 NOTE — ED Provider Notes (Signed)
[place]    CSN: [id number] Arrival date & time: 01/24/17  1607     History   Chief Complaint Chief Complaint  Patient presents with  . Shoulder Pain  . Hip Pain    HPI Heather Ellis is a 71 y.o. female.   The patient presented to the [facility] with a complaint of bilateral shoulder pain and left hip pain that has been hurting "for a while." She has seen her doctor about this who thinks it may be a pinched nerve. Nevertheless she has soreness in all of her fingers, her left hip, and both shoulders. She is unable to raise her arms over her head because of shoulder pain.  She's had no recent fall or injury. Takes Aleve and it does not help.      Past Medical History:  Diagnosis Date  . Acid reflux   . Asthma   . Hypertension   . Osteoporosis     Patient Active Problem List   Diagnosis Date Noted  . Post-menopause on HRT (hormone replacement therapy) 04/27/2016  . Osteoporosis, unspecified 12/06/2013  . Vaginal atrophy 12/06/2013    Past Surgical History:  Procedure Laterality Date  . ABDOMINAL HYSTERECTOMY     TOTAL ABDOMINAL HYSTERECTOMY   . COLONOSCOPY W/ BIOPSIES  2015  . OOPHORECTOMY     BSO    OB History    Gravida Para Term Preterm AB Living   6 5     1 5    SAB TAB Ectopic Multiple Live Births   1               Home Medications    Prior to Admission medications   Medication Sig Start Date End Date Taking? Authorizing Provider  alendronate (FOSAMAX) 70 MG tablet Take 70 mg by mouth once a week. Take with a full glass of water on an empty stomach.    [provider]  aspirin 81 MG tablet Take 81 mg by mouth daily.    [provider]  bisoprolol-hydrochlorothiazide (ZIAC) 10-6.25 MG per tablet Take 1 tablet by mouth daily.    [provider]  cholecalciferol (VITAMIN D) 1000 UNITS tablet Take 1,000 Units by mouth daily.    [provider]  guaiFENesin (MUCINEX) 600 MG 12 hr tablet Take by mouth 2  (two) times daily.    [provider]  HYDROcodone-acetaminophen (NORCO/VICODIN) 5-325 MG per tablet Take 1 tablet by mouth every 6 (six) hours as needed for moderate pain.    [provider]  lisinopril (PRINIVIL,ZESTRIL) 10 MG tablet Take 10 mg by mouth daily.    [provider]  mometasone-formoterol (DULERA) 100-5 MCG/ACT AERO Inhale 2 puffs into the lungs 2 (two) times daily.    [provider]  NONFORMULARY OR COMPOUNDED ITEM Estradiol .02% 1 ML Prefilled Applicator Sig: apply vaginally twice a week #90 Day Supply with 4 refills 04/27/16   Terrance Mass, MD  omeprazole (PRILOSEC) 40 MG capsule Take 40 mg by mouth daily.    [provider]  predniSONE (DELTASONE) 20 MG tablet Two daily with food 01/24/17   Robyn Haber, MD    Family History Family History  Problem Relation Age of Onset  . Cancer Mother        THROAT- SMOKER  . Diabetes Mother   . Diabetes Sister   . Heart disease Maternal Aunt   . Stroke Maternal Aunt   . Cancer Maternal Uncle  LUNG -   . Heart disease Maternal Uncle   . Stroke Maternal Uncle   . Ovarian cancer Maternal Grandmother     Social History Social History  Substance Use Topics  . Smoking status: Former Smoker    Quit date: 10/19/2014  . Smokeless tobacco: Never Used  . Alcohol use No     Allergies   Patient has no known allergies.   Review of Systems Review of Systems  Musculoskeletal: Positive for arthralgias, gait problem and joint swelling.  All other systems reviewed and are negative.    Physical Exam Triage Vital Signs ED Triage Vitals  Enc Vitals Group     BP 01/24/17 1638 (!) 170/90     Pulse Rate 01/24/17 1638 89     Resp 01/24/17 1638 18     Temp 01/24/17 1638 98.2 F (36.8 C)     Temp Source 01/24/17 1638 Oral     SpO2 01/24/17 1638 98 %     Weight --      Height --      Head Circumference --      Peak Flow --      Pain Score 01/24/17 1637 8     Pain Loc  --      Pain Edu? --      Excl. in [place]? --    No data found.   Updated Vital Signs BP (!) 170/90 (BP Location: Right Arm)   Pulse 89   Temp 98.2 F (36.8 C) (Oral)   Resp 18   SpO2 98%    Physical Exam  Constitutional: She is oriented to person, place, and time. She appears well-developed and well-nourished.  HENT:  Right Ear: External ear normal.  Left Ear: External ear normal.  Mouth/Throat: Oropharynx is clear and moist.  Eyes: Conjunctivae and EOM are normal. Pupils are equal, round, and reactive to light.  Neck: Normal range of motion. Neck supple.  Pulmonary/Chest: Effort normal.  Musculoskeletal:  Unable to abduct either upper arm beyond 30  Neurological: She is alert and oriented to person, place, and time.  Skin: Skin is warm and dry.  Skin has features of scleroderma  Nursing note and vitals reviewed.    UC Treatments / Results  Labs (all labs ordered are listed, but only abnormal results are displayed) Labs Reviewed - No data to display  EKG  EKG Interpretation None       Radiology No results found.  Procedures Procedures (including critical care time)  Medications Ordered in UC Medications - No data to display   Initial Impression / Assessment and Plan / UC Course  I have reviewed the triage vital signs and the nursing notes.  Pertinent labs & imaging results that were available during my care of the patient were reviewed by me and considered in my medical decision making (see chart for details).     Final Clinical Impressions(s) / UC Diagnoses   Final diagnoses:  Primary osteoarthritis involving multiple joints    New Prescriptions New Prescriptions   PREDNISONE (DELTASONE) 20 MG TABLET    Two daily with food     Robyn Haber, MD 01/24/17 1656

## 2017-01-24 NOTE — ED Triage Notes (Signed)
The patient presented to the [facility] with a complaint of bilateral shoulder pain and left hip pain that has been hurting "for a while."

## 2017-02-15 ENCOUNTER — Emergency Department (HOSPITAL_COMMUNITY)
Admission: EM | Admit: 2017-02-15 | Discharge: 2017-02-15 | Disposition: A | Discharge status: Home / Self Care | Payer: Medicare Other | Attending: Emergency Medicine | Admitting: Emergency Medicine

## 2017-02-15 ENCOUNTER — Encounter (HOSPITAL_COMMUNITY): Payer: Self-pay | Admitting: Emergency Medicine

## 2017-02-15 DIAGNOSIS — Z87891 Personal history of nicotine dependence: Secondary | ICD-10-CM | POA: Insufficient documentation

## 2017-02-15 DIAGNOSIS — M79622 Pain in left upper arm: Secondary | ICD-10-CM | POA: Insufficient documentation

## 2017-02-15 DIAGNOSIS — Z79899 Other long term (current) drug therapy: Secondary | ICD-10-CM | POA: Diagnosis not present

## 2017-02-15 DIAGNOSIS — Z7982 Long term (current) use of aspirin: Secondary | ICD-10-CM | POA: Diagnosis not present

## 2017-02-15 DIAGNOSIS — I10 Essential (primary) hypertension: Secondary | ICD-10-CM | POA: Insufficient documentation

## 2017-02-15 DIAGNOSIS — J45909 Unspecified asthma, uncomplicated: Secondary | ICD-10-CM | POA: Insufficient documentation

## 2017-02-15 DIAGNOSIS — M25512 Pain in left shoulder: Secondary | ICD-10-CM | POA: Diagnosis not present

## 2017-02-15 DIAGNOSIS — M79621 Pain in right upper arm: Secondary | ICD-10-CM | POA: Insufficient documentation

## 2017-02-15 DIAGNOSIS — M25551 Pain in right hip: Secondary | ICD-10-CM | POA: Diagnosis not present

## 2017-02-15 DIAGNOSIS — M25552 Pain in left hip: Secondary | ICD-10-CM | POA: Insufficient documentation

## 2017-02-15 DIAGNOSIS — M25511 Pain in right shoulder: Secondary | ICD-10-CM | POA: Diagnosis not present

## 2017-02-15 DIAGNOSIS — R52 Pain, unspecified: Secondary | ICD-10-CM

## 2017-02-15 DIAGNOSIS — M545 Low back pain: Secondary | ICD-10-CM | POA: Diagnosis not present

## 2017-02-15 MED ORDER — GABAPENTIN 100 MG PO CAPS: 100.0000 mg | ORAL_CAPSULE | Freq: Three times a day (TID) | ORAL | 0 refills | Status: DC

## 2017-02-15 MED ORDER — GABAPENTIN 100 MG PO CAPS
100.0000 mg | ORAL_CAPSULE | Freq: Once | ORAL | Status: AC
  Administered 2017-02-15: 100 mg via ORAL
  Filled 2017-02-15: qty 1

## 2017-02-15 NOTE — ED Triage Notes (Signed)
Pt reports bilateral arm pain and back pain going x1 month, seen at urgent care for same and dxed with arthritis. States RX prednisone was effective for pain, however finished RX and no other meds working.

## 2017-02-15 NOTE — ED Notes (Signed)
Pt c/o bilateral shoulder pain-- hurts to lift overhead, low back pain radiates down both legs. States was told that she had arthritis from urgent care.

## 2017-02-15 NOTE — Discharge Instructions (Signed)
As discussed, your evaluation today has been largely reassuring.  But, it is important that you monitor your condition carefully, and do not hesitate to return to the ED if you develop new, or concerning changes in your condition.  Your pain is likely due to arthritis, but additional evaluation with her primary care physician is appropriate.  Please be sure to discuss today's evaluation, and consideration of physical therapy referral.

## 2017-02-15 NOTE — ED Provider Notes (Signed)
[facility] DEPT Provider Note   CSN: [id number] Arrival date & time: 02/15/17  6767     History   Chief Complaint Chief Complaint  Patient presents with  . Arm Pain  . Back Pain    HPI Heather Ellis is a 71 y.o. female.  HPI  Patient presents with pain in multiple areas. Pain is present for weeks, possibly months. Pain is in both arms, shoulders, hips, low back, is sore, worse with activity. Patient is trying to schedule an appointment with her primary care physician, and went to urgent care last week due to this pain. She notes that she had transient relief while taking prednisone, but since running out of medication she has had return of her symptoms. No other new complaints including fever, chills, nausea, vomiting, vision changes, weakness in any extremity, chest pain, abdominal pain, dyspnea. Patient has a history of osteoporosis.   Past Medical History:  Diagnosis Date  . Acid reflux   . Asthma   . Hypertension   . Osteoporosis     Patient Active Problem List   Diagnosis Date Noted  . Post-menopause on HRT (hormone replacement therapy) 04/27/2016  . Osteoporosis, unspecified 12/06/2013  . Vaginal atrophy 12/06/2013    Past Surgical History:  Procedure Laterality Date  . ABDOMINAL HYSTERECTOMY     TOTAL ABDOMINAL HYSTERECTOMY   . COLONOSCOPY W/ BIOPSIES  2015  . OOPHORECTOMY     BSO    OB History    Gravida Para Term Preterm AB Living   6 5     1 5    SAB TAB Ectopic Multiple Live Births   1               Home Medications    Prior to Admission medications   Medication Sig Start Date End Date Taking? Authorizing Provider  alendronate (FOSAMAX) 70 MG tablet Take 70 mg by mouth once a week. Take with a full glass of water on an empty stomach.    [provider]  aspirin 81 MG tablet Take 81 mg by mouth daily.    [provider]  bisoprolol-hydrochlorothiazide (ZIAC) 10-6.25 MG per tablet Take 1 tablet by mouth daily.     [provider]  cholecalciferol (VITAMIN D) 1000 UNITS tablet Take 1,000 Units by mouth daily.    [provider]  gabapentin (NEURONTIN) 100 MG capsule Take 1 capsule (100 mg total) by mouth 3 (three) times daily. 02/15/17   Carmin Muskrat, MD  guaiFENesin (MUCINEX) 600 MG 12 hr tablet Take by mouth 2 (two) times daily.    [provider]  HYDROcodone-acetaminophen (NORCO/VICODIN) 5-325 MG per tablet Take 1 tablet by mouth every 6 (six) hours as needed for moderate pain.    [provider]  lisinopril (PRINIVIL,ZESTRIL) 10 MG tablet Take 10 mg by mouth daily.    [provider]  mometasone-formoterol (DULERA) 100-5 MCG/ACT AERO Inhale 2 puffs into the lungs 2 (two) times daily.    [provider]  NONFORMULARY OR COMPOUNDED ITEM Estradiol .02% 1 ML Prefilled Applicator Sig: apply vaginally twice a week #90 Day Supply with 4 refills 04/27/16   Terrance Mass, MD  omeprazole (PRILOSEC) 40 MG capsule Take 40 mg by mouth daily.    [provider]  predniSONE (DELTASONE) 20 MG tablet Two daily with food 01/24/17   Robyn Haber, MD    Family History Family History  Problem Relation Age of Onset  . Cancer Mother  THROAT- SMOKER  . Diabetes Mother   . Diabetes Sister   . Heart disease Maternal Aunt   . Stroke Maternal Aunt   . Cancer Maternal Uncle        LUNG -   . Heart disease Maternal Uncle   . Stroke Maternal Uncle   . Ovarian cancer Maternal Grandmother     Social History Social History  Substance Use Topics  . Smoking status: Former Smoker    Quit date: 10/19/2014  . Smokeless tobacco: Never Used  . Alcohol use No     Allergies   Patient has no known allergies.   Review of Systems Review of Systems  Constitutional:       Per HPI, otherwise negative  HENT:       Per HPI, otherwise negative  Respiratory:       Per HPI, otherwise negative  Cardiovascular:       Per HPI, otherwise negative    Gastrointestinal: Negative for vomiting.  Endocrine:       Negative aside from HPI  Genitourinary:       Neg aside from HPI   Musculoskeletal:       Per HPI, otherwise negative  Skin: Negative.   Neurological: Negative for syncope.     Physical Exam Updated Vital Signs BP (!) 165/105 (BP Location: Left Arm)   Pulse (!) 102   Temp 97.9 F (36.6 C) (Oral)   Ht 5\' 3"  (1.6 m)   Wt 63.5 kg (140 lb)   SpO2 93%   BMI 24.80 kg/m   Physical Exam  Constitutional: She is oriented to person, place, and time. She appears well-developed and well-nourished. No distress.  HENT:  Head: Normocephalic and atraumatic.  Eyes: Conjunctivae and EOM are normal.  Cardiovascular: Normal rate and regular rhythm.   Pulmonary/Chest: Effort normal and breath sounds normal. No stridor. No respiratory distress.  Abdominal: She exhibits no distension. There is no tenderness.  Musculoskeletal: She exhibits no edema, tenderness or deformity.  Neurological: She is alert and oriented to person, place, and time. No cranial nerve deficit.  Skin: Skin is warm and dry.  Psychiatric: She has a normal mood and affect.  Nursing note and vitals reviewed.    ED Treatments / Results   Procedures Procedures (including critical care time)  Medications Ordered in ED Medications  gabapentin (NEURONTIN) capsule 100 mg (not administered)     Initial Impression / Assessment and Plan / ED Course  I have reviewed the triage vital signs and the nursing notes.  Pertinent labs & imaging results that were available during my care of the patient were reviewed by me and considered in my medical decision making (see chart for details).  Well-appearing elderly female presents with pain in multiple areas. No specific tenderness to palpation, no evidence for septic polyarthritis, no evidence for bacteremia or sepsis, no evidence for neurologic dysfunction. Patient does have osteoporosis, is not a good candidate for  additional steroids for symptom control. Patient's age, suspicion for osteoarthritis, as previously documented, patient started on a course of Neurontin. Patient will follow-up with primary care, was encouraged to call today to expedite follow-up. Absent other complaints, focal pain, no neurologic dysfunction, unremarkable vital signs, the patient is appropriate for close outpatient follow-up.   Final Clinical Impressions(s) / ED Diagnoses   Final diagnoses:  Pain    New Prescriptions New Prescriptions   GABAPENTIN (NEURONTIN) 100 MG CAPSULE    Take 1 capsule (100 mg total) by mouth 3 (three)  times daily.     Carmin Muskrat, MD 02/15/17 [phone]

## 2017-02-24 DIAGNOSIS — M7551 Bursitis of right shoulder: Secondary | ICD-10-CM | POA: Diagnosis not present

## 2017-02-24 DIAGNOSIS — M545 Low back pain: Secondary | ICD-10-CM | POA: Diagnosis not present

## 2017-02-24 DIAGNOSIS — M47812 Spondylosis without myelopathy or radiculopathy, cervical region: Secondary | ICD-10-CM | POA: Diagnosis not present

## 2017-02-24 DIAGNOSIS — M7552 Bursitis of left shoulder: Secondary | ICD-10-CM | POA: Diagnosis not present

## 2017-02-24 DIAGNOSIS — M160 Bilateral primary osteoarthritis of hip: Secondary | ICD-10-CM | POA: Diagnosis not present

## 2017-02-26 ENCOUNTER — Other Ambulatory Visit: Payer: Self-pay | Admitting: Internal Medicine

## 2017-02-26 DIAGNOSIS — Z1231 Encounter for screening mammogram for malignant neoplasm of breast: Secondary | ICD-10-CM

## 2017-03-04 DIAGNOSIS — M810 Age-related osteoporosis without current pathological fracture: Secondary | ICD-10-CM | POA: Diagnosis not present

## 2017-03-04 DIAGNOSIS — Z0000 Encounter for general adult medical examination without abnormal findings: Secondary | ICD-10-CM | POA: Diagnosis not present

## 2017-03-04 DIAGNOSIS — I10 Essential (primary) hypertension: Secondary | ICD-10-CM | POA: Diagnosis not present

## 2017-03-10 DIAGNOSIS — H00015 Hordeolum externum left lower eyelid: Secondary | ICD-10-CM | POA: Diagnosis not present

## 2017-03-10 DIAGNOSIS — L723 Sebaceous cyst: Secondary | ICD-10-CM | POA: Diagnosis not present

## 2017-03-12 DIAGNOSIS — H02839 Dermatochalasis of unspecified eye, unspecified eyelid: Secondary | ICD-10-CM | POA: Diagnosis not present

## 2017-03-12 DIAGNOSIS — H0015 Chalazion left lower eyelid: Secondary | ICD-10-CM | POA: Diagnosis not present

## 2017-03-12 DIAGNOSIS — Z961 Presence of intraocular lens: Secondary | ICD-10-CM | POA: Diagnosis not present

## 2017-03-17 DIAGNOSIS — I10 Essential (primary) hypertension: Secondary | ICD-10-CM | POA: Diagnosis not present

## 2017-03-17 DIAGNOSIS — K5900 Constipation, unspecified: Secondary | ICD-10-CM | POA: Diagnosis not present

## 2017-03-17 DIAGNOSIS — Z86010 Personal history of colonic polyps: Secondary | ICD-10-CM | POA: Diagnosis not present

## 2017-04-07 DIAGNOSIS — M25512 Pain in left shoulder: Secondary | ICD-10-CM | POA: Diagnosis not present

## 2017-04-07 DIAGNOSIS — I10 Essential (primary) hypertension: Secondary | ICD-10-CM | POA: Diagnosis not present

## 2017-04-07 DIAGNOSIS — M25511 Pain in right shoulder: Secondary | ICD-10-CM | POA: Diagnosis not present

## 2017-04-07 DIAGNOSIS — E559 Vitamin D deficiency, unspecified: Secondary | ICD-10-CM | POA: Diagnosis not present

## 2017-04-12 ENCOUNTER — Ambulatory Visit
Admission: RE | Admit: 2017-04-12 | Discharge: 2017-04-12 | Disposition: A | Discharge status: Home / Self Care | Payer: Medicare Other | Source: Ambulatory Visit | Attending: Internal Medicine | Admitting: Internal Medicine

## 2017-04-12 DIAGNOSIS — Z1231 Encounter for screening mammogram for malignant neoplasm of breast: Secondary | ICD-10-CM | POA: Diagnosis not present

## 2017-04-14 DIAGNOSIS — M7060 Trochanteric bursitis, unspecified hip: Secondary | ICD-10-CM | POA: Diagnosis not present

## 2017-04-14 DIAGNOSIS — M25519 Pain in unspecified shoulder: Secondary | ICD-10-CM | POA: Diagnosis not present

## 2017-04-14 DIAGNOSIS — M7550 Bursitis of unspecified shoulder: Secondary | ICD-10-CM | POA: Diagnosis not present

## 2017-04-14 DIAGNOSIS — M25559 Pain in unspecified hip: Secondary | ICD-10-CM | POA: Diagnosis not present

## 2017-04-21 DIAGNOSIS — M25519 Pain in unspecified shoulder: Secondary | ICD-10-CM | POA: Diagnosis not present

## 2017-04-21 DIAGNOSIS — M25559 Pain in unspecified hip: Secondary | ICD-10-CM | POA: Diagnosis not present

## 2017-04-21 DIAGNOSIS — M7060 Trochanteric bursitis, unspecified hip: Secondary | ICD-10-CM | POA: Diagnosis not present

## 2017-04-21 DIAGNOSIS — M7550 Bursitis of unspecified shoulder: Secondary | ICD-10-CM | POA: Diagnosis not present

## 2017-04-27 DIAGNOSIS — Z86010 Personal history of colonic polyps: Secondary | ICD-10-CM | POA: Diagnosis not present

## 2017-04-27 DIAGNOSIS — K5900 Constipation, unspecified: Secondary | ICD-10-CM | POA: Diagnosis not present

## 2017-05-19 DIAGNOSIS — M25519 Pain in unspecified shoulder: Secondary | ICD-10-CM | POA: Diagnosis not present

## 2017-05-19 DIAGNOSIS — M7060 Trochanteric bursitis, unspecified hip: Secondary | ICD-10-CM | POA: Diagnosis not present

## 2017-05-19 DIAGNOSIS — M7550 Bursitis of unspecified shoulder: Secondary | ICD-10-CM | POA: Diagnosis not present

## 2017-05-19 DIAGNOSIS — M25559 Pain in unspecified hip: Secondary | ICD-10-CM | POA: Diagnosis not present

## 2017-06-23 DIAGNOSIS — M810 Age-related osteoporosis without current pathological fracture: Secondary | ICD-10-CM | POA: Diagnosis not present

## 2017-06-30 DIAGNOSIS — M353 Polymyalgia rheumatica: Secondary | ICD-10-CM | POA: Diagnosis not present

## 2017-06-30 DIAGNOSIS — M810 Age-related osteoporosis without current pathological fracture: Secondary | ICD-10-CM | POA: Diagnosis not present

## 2017-06-30 DIAGNOSIS — M25519 Pain in unspecified shoulder: Secondary | ICD-10-CM | POA: Diagnosis not present

## 2017-06-30 DIAGNOSIS — M25559 Pain in unspecified hip: Secondary | ICD-10-CM | POA: Diagnosis not present

## 2017-06-30 DIAGNOSIS — Z7952 Long term (current) use of systemic steroids: Secondary | ICD-10-CM | POA: Diagnosis not present

## 2017-07-05 DIAGNOSIS — E559 Vitamin D deficiency, unspecified: Secondary | ICD-10-CM | POA: Diagnosis not present

## 2017-07-05 DIAGNOSIS — I10 Essential (primary) hypertension: Secondary | ICD-10-CM | POA: Diagnosis not present

## 2017-07-05 DIAGNOSIS — M79602 Pain in left arm: Secondary | ICD-10-CM | POA: Diagnosis not present

## 2017-07-05 DIAGNOSIS — M25559 Pain in unspecified hip: Secondary | ICD-10-CM | POA: Diagnosis not present

## 2017-07-08 DIAGNOSIS — R739 Hyperglycemia, unspecified: Secondary | ICD-10-CM | POA: Diagnosis not present

## 2017-07-08 DIAGNOSIS — K219 Gastro-esophageal reflux disease without esophagitis: Secondary | ICD-10-CM | POA: Diagnosis not present

## 2017-07-08 DIAGNOSIS — I10 Essential (primary) hypertension: Secondary | ICD-10-CM | POA: Diagnosis not present

## 2017-07-08 DIAGNOSIS — E559 Vitamin D deficiency, unspecified: Secondary | ICD-10-CM | POA: Diagnosis not present

## 2017-09-30 DIAGNOSIS — M25559 Pain in unspecified hip: Secondary | ICD-10-CM | POA: Diagnosis not present

## 2017-09-30 DIAGNOSIS — Z7952 Long term (current) use of systemic steroids: Secondary | ICD-10-CM | POA: Diagnosis not present

## 2017-09-30 DIAGNOSIS — M353 Polymyalgia rheumatica: Secondary | ICD-10-CM | POA: Diagnosis not present

## 2017-09-30 DIAGNOSIS — M810 Age-related osteoporosis without current pathological fracture: Secondary | ICD-10-CM | POA: Diagnosis not present

## 2017-09-30 DIAGNOSIS — M25519 Pain in unspecified shoulder: Secondary | ICD-10-CM | POA: Diagnosis not present

## 2017-11-02 ENCOUNTER — Ambulatory Visit (HOSPITAL_COMMUNITY)
Admission: EM | Admit: 2017-11-02 | Discharge: 2017-11-02 | Disposition: A | Discharge status: Home / Self Care | Payer: Medicare Other | Attending: Physician Assistant | Admitting: Physician Assistant

## 2017-11-02 ENCOUNTER — Encounter (HOSPITAL_COMMUNITY): Payer: Self-pay | Admitting: Emergency Medicine

## 2017-11-02 DIAGNOSIS — M25532 Pain in left wrist: Secondary | ICD-10-CM

## 2017-11-02 NOTE — ED Provider Notes (Signed)
11/02/2017 5:43 PM   DOB: [birth year] / MRN: [id number]  SUBJECTIVE:  Heather Ellis is a 72 y.o. female presenting for wrist pain.  She has a history of osteoporosis and is taking Fosamax.  She denies any injury to the wrist whatsoever.  States that " it just started hurting a few days ago."  The pain is worse with movement.  There is no swelling or bruising.  She has No Known Allergies.   She  has a past medical history of Acid reflux, Asthma, Hypertension, and Osteoporosis.    She  reports that she quit smoking about 3 years ago. she has never used smokeless tobacco. She reports that she does not drink alcohol or use drugs. She  reports that she does not engage in sexual activity. The patient  has a past surgical history that includes Abdominal hysterectomy; Colonoscopy w/ biopsies (2015); and Oophorectomy.  Her family history includes Cancer in her maternal uncle and mother; Diabetes in her mother and sister; Heart disease in her maternal aunt and maternal uncle; Ovarian cancer in her maternal grandmother; Stroke in her maternal aunt and maternal uncle.  Review of Systems  Constitutional: Negative for chills, diaphoresis and fever.  Respiratory: Negative for shortness of breath.   Cardiovascular: Negative for chest pain, orthopnea and leg swelling.  Gastrointestinal: Negative for nausea.  Skin: Negative for rash.  Neurological: Negative for dizziness.    OBJECTIVE:  BP (!) 152/70   Pulse 67   Temp 97.9 F (36.6 C) (Temporal)   Resp 16   Wt 145 lb (65.8 kg)   SpO2 97%   BMI 25.69 kg/m   Physical Exam  Constitutional: She is active.  Non-toxic appearance.  Cardiovascular: Normal rate.  Pulmonary/Chest: Effort normal. No tachypnea.  Musculoskeletal: She exhibits tenderness (Over the carpal tunnel.  The warm hand is well perfused and the radial pulses 2+.). She exhibits no edema or deformity.  Neurological: She is alert.  Skin: Skin is warm and dry. She is not diaphoretic. No  pallor.    No results found for this or any previous visit (from the past 72 hour(s)).  No results found.  ASSESSMENT AND PLAN:  Orders Placed This Encounter  Procedures  . Splint wrist    Standing Status:   Standing    Number of Occurrences:   1    Order Specific Question:   Laterality    Answer:   Left     Left wrist pain: Most likely wrist sprain.  There is no deformity bruising or swelling.  She has a history of osteoporosis however there is no reason to suspect a fracture as there has been no injury.  I provided her with a wrist splint and have advised that she come back in about 2 weeks if she does not improve with stabilizing the joint.      The patient is advised to call or return to clinic if she does not see an improvement in symptoms, or to seek the care of the closest emergency department if she worsens with the above plan.   Heather Ellis, MHS, PA-C 11/02/2017 5:43 PM    Heather Coop, PA-C 11/02/17 1746

## 2017-11-02 NOTE — ED Triage Notes (Signed)
PT reports left wrist pain with no injury since Thursday.

## 2017-11-02 NOTE — Discharge Instructions (Signed)
Please take 1000 mg of Tylenol every 8 hours along with your prednisone.

## 2017-11-25 ENCOUNTER — Other Ambulatory Visit: Payer: Self-pay

## 2017-11-25 ENCOUNTER — Encounter (HOSPITAL_COMMUNITY): Payer: Self-pay | Admitting: Emergency Medicine

## 2017-11-25 ENCOUNTER — Ambulatory Visit (HOSPITAL_COMMUNITY)
Admission: EM | Admit: 2017-11-25 | Discharge: 2017-11-25 | Disposition: A | Discharge status: Home / Self Care | Payer: Medicare Other | Attending: Family | Admitting: Family

## 2017-11-25 DIAGNOSIS — R110 Nausea: Secondary | ICD-10-CM | POA: Diagnosis not present

## 2017-11-25 DIAGNOSIS — R197 Diarrhea, unspecified: Secondary | ICD-10-CM

## 2017-11-25 MED ORDER — ONDANSETRON 8 MG PO TBDP: 8.0000 mg | ORAL_TABLET | Freq: Three times a day (TID) | ORAL | 0 refills | Status: DC | PRN

## 2017-11-25 NOTE — ED Provider Notes (Signed)
[facility]    CSN: [id number] Arrival date & time: 11/25/17  1011     History   Chief Complaint Chief Complaint  Patient presents with  . Emesis    HPI Heather Ellis is a 72 y.o. female.   72 year old female presents with nausea, vomiting and diarrhea that started 2 days ago. Denies any fever or severe abdominal pain. No longer vomiting but still having nausea. Last episode of diarrhea was last evening. No blood in stool or emesis. Denies any URI symptoms. Has been able to drink Gingerale and Coke but minimal solid foods. Multiple people at work have had a GI virus in the past week. Has history of HTN, Osteoporosis, GERD and asthma and takes Ziac, Lisinopril, Prilosec, vit D, aspirin and Prednisone daily.   The history is provided by the patient.    Past Medical History:  Diagnosis Date  . Acid reflux   . Asthma   . Hypertension   . Osteoporosis     Patient Active Problem List   Diagnosis Date Noted  . Post-menopause on HRT (hormone replacement therapy) 04/27/2016  . Osteoporosis, unspecified 12/06/2013  . Vaginal atrophy 12/06/2013    Past Surgical History:  Procedure Laterality Date  . ABDOMINAL HYSTERECTOMY     TOTAL ABDOMINAL HYSTERECTOMY   . COLONOSCOPY W/ BIOPSIES  2015  . OOPHORECTOMY     BSO    OB History    Gravida  6   Para  5   Term      Preterm      AB  1   Living  5     SAB  1   TAB      Ectopic      Multiple      Live Births               Home Medications    Prior to Admission medications   Medication Sig Start Date End Date Taking? Authorizing Provider  aspirin 81 MG tablet Take 81 mg by mouth daily.    [provider]  bisoprolol-hydrochlorothiazide (ZIAC) 10-6.25 MG per tablet Take 1 tablet by mouth daily.    [provider]  cholecalciferol (VITAMIN D) 1000 UNITS tablet Take 1,000 Units by mouth daily.    [provider]  lisinopril (PRINIVIL,ZESTRIL) 10 MG tablet Take 40  mg by mouth daily.     [provider]  mometasone-formoterol (DULERA) 100-5 MCG/ACT AERO Inhale 2 puffs into the lungs 2 (two) times daily.    [provider]  NONFORMULARY OR COMPOUNDED ITEM Estradiol .02% 1 ML Prefilled Applicator Sig: apply vaginally twice a week #90 Day Supply with 4 refills 04/27/16   Terrance Mass, MD  omeprazole (PRILOSEC) 40 MG capsule Take 40 mg by mouth daily.    [provider]  ondansetron (ZOFRAN ODT) 8 MG disintegrating tablet Take 1 tablet (8 mg total) by mouth every 8 (eight) hours as needed for nausea. 11/25/17   Katy Apo, NP  predniSONE (DELTASONE) 20 MG tablet Two daily with food Patient taking differently: Take 5 mg by mouth.  01/24/17   Robyn Haber, MD    Family History Family History  Problem Relation Age of Onset  . Cancer Mother        THROAT- SMOKER  . Diabetes Mother   . Diabetes Sister   . Heart disease Maternal Aunt   . Stroke Maternal Aunt   . Cancer Maternal Uncle  LUNG -   . Heart disease Maternal Uncle   . Stroke Maternal Uncle   . Ovarian cancer Maternal Grandmother     Social History Social History   Tobacco Use  . Smoking status: Former Smoker    Last attempt to quit: 10/19/2014    Years since quitting: 3.1  . Smokeless tobacco: Never Used  Substance Use Topics  . Alcohol use: No    Alcohol/week: 0.0 oz  . Drug use: No     Allergies   Fosamax [alendronate sodium]   Review of Systems Review of Systems  Constitutional: Positive for appetite change, chills and fatigue. Negative for diaphoresis and fever.  HENT: Negative for congestion, nosebleeds, postnasal drip, rhinorrhea, sore throat and trouble swallowing.   Eyes: Negative for photophobia and visual disturbance.  Respiratory: Negative for cough, chest tightness, shortness of breath and wheezing.   Cardiovascular: Negative for chest pain.  Gastrointestinal: Positive for abdominal pain (slight abdominal cramping),  diarrhea, nausea and vomiting. Negative for blood in stool.  Genitourinary: Negative for decreased urine volume, difficulty urinating, dysuria, flank pain and hematuria.  Musculoskeletal: Negative for back pain and neck pain.  Skin: Negative for color change, rash and wound.  Neurological: Negative for dizziness, tremors, seizures, syncope, speech difficulty, weakness, light-headedness, numbness and headaches.     Physical Exam Triage Vital Signs ED Triage Vitals  Enc Vitals Group     BP 11/25/17 1055 124/79     Pulse Rate 11/25/17 1055 (!) 102     Resp 11/25/17 1055 (!) 22     Temp 11/25/17 1055 98.5 F (36.9 C)     Temp Source 11/25/17 1055 Oral     SpO2 11/25/17 1055 97 %     Weight --      Height --      Head Circumference --      Peak Flow --      Pain Score 11/25/17 1053 0     Pain Loc --      Pain Edu? --      Excl. in [place]? --    No data found.  Updated Vital Signs BP 124/79 (BP Location: Right Arm)   Pulse (!) 102   Temp 98.5 F (36.9 C) (Oral)   Resp (!) 22   SpO2 97%   Visual Acuity Right Eye Distance:   Left Eye Distance:   Bilateral Distance:    Right Eye Near:   Left Eye Near:    Bilateral Near:     Physical Exam  Constitutional: She is oriented to person, place, and time. She appears well-developed and well-nourished. She is cooperative. She appears ill. No distress.  HENT:  Head: Normocephalic and atraumatic.  Right Ear: Hearing, tympanic membrane, external ear and ear canal normal.  Left Ear: Hearing, tympanic membrane, external ear and ear canal normal.  Nose: Nose normal. Right sinus exhibits no maxillary sinus tenderness and no frontal sinus tenderness. Left sinus exhibits no maxillary sinus tenderness and no frontal sinus tenderness.  Mouth/Throat: Uvula is midline and oropharynx is clear and moist. Mucous membranes are dry. She has dentures.  Eyes: Conjunctivae and EOM are normal.  Neck: Normal range of motion. Neck supple.  Cardiovascular:  Normal rate, regular rhythm and normal heart sounds.  No murmur heard. Pulmonary/Chest: Effort normal and breath sounds normal. No stridor. No tachypnea. No respiratory distress. She has no decreased breath sounds. She has no wheezes. She has no rhonchi. She has no rales.  Abdominal: Soft. Normal appearance. She  exhibits no distension, no abdominal bruit and no mass. Bowel sounds are increased. There is generalized tenderness (mild). There is no rigidity, no rebound, no guarding and no CVA tenderness.  Musculoskeletal: Normal range of motion.  Lymphadenopathy:    She has no cervical adenopathy.  Neurological: She is alert and oriented to person, place, and time. She has normal strength. No sensory deficit. Coordination and gait normal.  Skin: Skin is warm and dry. Capillary refill takes less than 2 seconds. No rash noted.  Psychiatric: She has a normal mood and affect. Her behavior is normal. Judgment and thought content normal.  Vitals reviewed.    UC Treatments / Results  Labs (all labs ordered are listed, but only abnormal results are displayed) Labs Reviewed - No data to display  EKG None Radiology No results found.  Procedures Procedures (including critical care time)  Medications Ordered in UC Medications - No data to display   Initial Impression / Assessment and Plan / UC Course  I have reviewed the triage vital signs and the nursing notes.  Pertinent labs & imaging results that were available during my care of the patient were reviewed by me and considered in my medical decision making (see chart for details).    Discussed with patient that she probably has a viral illness. Symptoms are slowly resolving except nausea. Recommend trial Zofran 8mg  every 8 hours as needed for nausea. Continue to push fluids. Eat small, frequent bland foods and advance diet as tolerated. Follow-up with her PCP in 1 to 2 days if not improving.   Final Clinical Impressions(s) / UC Diagnoses    Final diagnoses:  Nausea without vomiting  Diarrhea, unspecified type    ED Discharge Orders        Ordered    ondansetron (ZOFRAN ODT) 8 MG disintegrating tablet  Every 8 hours PRN     11/25/17 1223       Controlled Substance Prescriptions [place] Controlled Substance Registry consulted? Not Applicable   Katy Apo, NP 11/26/17 [phone]

## 2017-11-25 NOTE — Discharge Instructions (Addendum)
Recommend start Zofran 8mg  tablet- place under tongue every 8 hours as needed for nausea. Continue to push fluids. Eat small, bland foods and advance diet as tolerated. Recommend follow-up with your PCP in 1 to 2 days if not improving.

## 2017-11-25 NOTE — ED Triage Notes (Signed)
Onset Tuesday night of vomiting and diarrhea.  Denies pain currently.  Last diarrhea was yesterday.  No vomiting since Tuesday night.    Patient continues to be nauseated.

## 2017-11-29 DIAGNOSIS — G8929 Other chronic pain: Secondary | ICD-10-CM | POA: Diagnosis not present

## 2017-11-29 DIAGNOSIS — M25512 Pain in left shoulder: Secondary | ICD-10-CM | POA: Diagnosis not present

## 2017-11-29 DIAGNOSIS — M5412 Radiculopathy, cervical region: Secondary | ICD-10-CM | POA: Diagnosis not present

## 2017-11-29 DIAGNOSIS — M25511 Pain in right shoulder: Secondary | ICD-10-CM | POA: Diagnosis not present

## 2017-11-29 DIAGNOSIS — M5431 Sciatica, right side: Secondary | ICD-10-CM | POA: Diagnosis not present

## 2017-12-03 DIAGNOSIS — M5416 Radiculopathy, lumbar region: Secondary | ICD-10-CM | POA: Diagnosis not present

## 2017-12-03 DIAGNOSIS — M5412 Radiculopathy, cervical region: Secondary | ICD-10-CM | POA: Diagnosis not present

## 2017-12-23 DIAGNOSIS — M5030 Other cervical disc degeneration, unspecified cervical region: Secondary | ICD-10-CM | POA: Insufficient documentation

## 2017-12-23 DIAGNOSIS — M5136 Other intervertebral disc degeneration, lumbar region: Secondary | ICD-10-CM | POA: Diagnosis not present

## 2017-12-29 DIAGNOSIS — M810 Age-related osteoporosis without current pathological fracture: Secondary | ICD-10-CM | POA: Diagnosis not present

## 2017-12-29 DIAGNOSIS — M353 Polymyalgia rheumatica: Secondary | ICD-10-CM | POA: Diagnosis not present

## 2017-12-29 DIAGNOSIS — Z7952 Long term (current) use of systemic steroids: Secondary | ICD-10-CM | POA: Diagnosis not present

## 2017-12-29 DIAGNOSIS — M7989 Other specified soft tissue disorders: Secondary | ICD-10-CM | POA: Diagnosis not present

## 2017-12-29 DIAGNOSIS — M79643 Pain in unspecified hand: Secondary | ICD-10-CM | POA: Diagnosis not present

## 2018-02-09 DIAGNOSIS — M25519 Pain in unspecified shoulder: Secondary | ICD-10-CM | POA: Diagnosis not present

## 2018-02-09 DIAGNOSIS — Z7952 Long term (current) use of systemic steroids: Secondary | ICD-10-CM | POA: Diagnosis not present

## 2018-02-09 DIAGNOSIS — M810 Age-related osteoporosis without current pathological fracture: Secondary | ICD-10-CM | POA: Diagnosis not present

## 2018-02-09 DIAGNOSIS — M353 Polymyalgia rheumatica: Secondary | ICD-10-CM | POA: Diagnosis not present

## 2018-02-09 DIAGNOSIS — M25559 Pain in unspecified hip: Secondary | ICD-10-CM | POA: Diagnosis not present

## 2018-03-09 DIAGNOSIS — R739 Hyperglycemia, unspecified: Secondary | ICD-10-CM | POA: Diagnosis not present

## 2018-03-09 DIAGNOSIS — I10 Essential (primary) hypertension: Secondary | ICD-10-CM | POA: Diagnosis not present

## 2018-03-09 DIAGNOSIS — E559 Vitamin D deficiency, unspecified: Secondary | ICD-10-CM | POA: Diagnosis not present

## 2018-03-16 ENCOUNTER — Other Ambulatory Visit: Payer: Self-pay | Admitting: Internal Medicine

## 2018-03-16 DIAGNOSIS — I10 Essential (primary) hypertension: Secondary | ICD-10-CM | POA: Diagnosis not present

## 2018-03-16 DIAGNOSIS — R3129 Other microscopic hematuria: Secondary | ICD-10-CM | POA: Diagnosis not present

## 2018-03-16 DIAGNOSIS — R739 Hyperglycemia, unspecified: Secondary | ICD-10-CM | POA: Diagnosis not present

## 2018-03-16 DIAGNOSIS — Z1231 Encounter for screening mammogram for malignant neoplasm of breast: Secondary | ICD-10-CM

## 2018-03-16 DIAGNOSIS — Z0000 Encounter for general adult medical examination without abnormal findings: Secondary | ICD-10-CM | POA: Diagnosis not present

## 2018-04-20 ENCOUNTER — Ambulatory Visit
Admission: RE | Admit: 2018-04-20 | Discharge: 2018-04-20 | Disposition: A | Discharge status: Home / Self Care | Payer: Medicare Other | Source: Ambulatory Visit | Attending: Internal Medicine | Admitting: Internal Medicine

## 2018-04-20 DIAGNOSIS — Z1231 Encounter for screening mammogram for malignant neoplasm of breast: Secondary | ICD-10-CM

## 2018-05-04 DIAGNOSIS — R8271 Bacteriuria: Secondary | ICD-10-CM | POA: Diagnosis not present

## 2018-05-04 DIAGNOSIS — R3121 Asymptomatic microscopic hematuria: Secondary | ICD-10-CM | POA: Diagnosis not present

## 2018-05-11 DIAGNOSIS — R3121 Asymptomatic microscopic hematuria: Secondary | ICD-10-CM | POA: Diagnosis not present

## 2018-05-11 DIAGNOSIS — M25519 Pain in unspecified shoulder: Secondary | ICD-10-CM | POA: Diagnosis not present

## 2018-05-11 DIAGNOSIS — M353 Polymyalgia rheumatica: Secondary | ICD-10-CM | POA: Diagnosis not present

## 2018-05-11 DIAGNOSIS — R3129 Other microscopic hematuria: Secondary | ICD-10-CM | POA: Diagnosis not present

## 2018-05-11 DIAGNOSIS — Z7952 Long term (current) use of systemic steroids: Secondary | ICD-10-CM | POA: Diagnosis not present

## 2018-05-11 DIAGNOSIS — Z79899 Other long term (current) drug therapy: Secondary | ICD-10-CM | POA: Diagnosis not present

## 2018-05-11 DIAGNOSIS — M810 Age-related osteoporosis without current pathological fracture: Secondary | ICD-10-CM | POA: Diagnosis not present

## 2018-05-11 DIAGNOSIS — M1990 Unspecified osteoarthritis, unspecified site: Secondary | ICD-10-CM | POA: Diagnosis not present

## 2018-05-11 DIAGNOSIS — M79643 Pain in unspecified hand: Secondary | ICD-10-CM | POA: Diagnosis not present

## 2018-05-25 DIAGNOSIS — R3121 Asymptomatic microscopic hematuria: Secondary | ICD-10-CM | POA: Diagnosis not present

## 2018-05-25 DIAGNOSIS — N3582 Other urethral stricture, female: Secondary | ICD-10-CM | POA: Diagnosis not present

## 2018-06-15 DIAGNOSIS — M353 Polymyalgia rheumatica: Secondary | ICD-10-CM | POA: Diagnosis not present

## 2018-06-15 DIAGNOSIS — R739 Hyperglycemia, unspecified: Secondary | ICD-10-CM | POA: Diagnosis not present

## 2018-06-15 DIAGNOSIS — M25519 Pain in unspecified shoulder: Secondary | ICD-10-CM | POA: Diagnosis not present

## 2018-06-15 DIAGNOSIS — M79643 Pain in unspecified hand: Secondary | ICD-10-CM | POA: Diagnosis not present

## 2018-06-15 DIAGNOSIS — I10 Essential (primary) hypertension: Secondary | ICD-10-CM | POA: Diagnosis not present

## 2018-06-15 DIAGNOSIS — M810 Age-related osteoporosis without current pathological fracture: Secondary | ICD-10-CM | POA: Diagnosis not present

## 2018-06-15 DIAGNOSIS — M1990 Unspecified osteoarthritis, unspecified site: Secondary | ICD-10-CM | POA: Diagnosis not present

## 2018-06-22 DIAGNOSIS — I10 Essential (primary) hypertension: Secondary | ICD-10-CM | POA: Diagnosis not present

## 2018-06-22 DIAGNOSIS — K219 Gastro-esophageal reflux disease without esophagitis: Secondary | ICD-10-CM | POA: Diagnosis not present

## 2018-06-29 DIAGNOSIS — M810 Age-related osteoporosis without current pathological fracture: Secondary | ICD-10-CM | POA: Diagnosis not present

## 2018-07-06 DIAGNOSIS — I10 Essential (primary) hypertension: Secondary | ICD-10-CM | POA: Diagnosis not present

## 2018-07-27 DIAGNOSIS — M353 Polymyalgia rheumatica: Secondary | ICD-10-CM | POA: Diagnosis not present

## 2018-07-27 DIAGNOSIS — Z7952 Long term (current) use of systemic steroids: Secondary | ICD-10-CM | POA: Diagnosis not present

## 2018-07-27 DIAGNOSIS — M79643 Pain in unspecified hand: Secondary | ICD-10-CM | POA: Diagnosis not present

## 2018-07-27 DIAGNOSIS — M810 Age-related osteoporosis without current pathological fracture: Secondary | ICD-10-CM | POA: Diagnosis not present

## 2018-07-27 DIAGNOSIS — M1990 Unspecified osteoarthritis, unspecified site: Secondary | ICD-10-CM | POA: Diagnosis not present

## 2018-08-16 ENCOUNTER — Ambulatory Visit (HOSPITAL_COMMUNITY)
Admission: EM | Admit: 2018-08-16 | Discharge: 2018-08-16 | Disposition: A | Discharge status: Home / Self Care | Payer: Medicare Other | Attending: Family Medicine | Admitting: Family Medicine

## 2018-08-16 ENCOUNTER — Encounter (HOSPITAL_COMMUNITY): Payer: Self-pay | Admitting: Emergency Medicine

## 2018-08-16 ENCOUNTER — Other Ambulatory Visit: Payer: Self-pay

## 2018-08-16 DIAGNOSIS — M79605 Pain in left leg: Secondary | ICD-10-CM | POA: Insufficient documentation

## 2018-08-16 MED ORDER — DICLOFENAC SODIUM 1 % TD GEL: 2.0000 g | Freq: Four times a day (QID) | TRANSDERMAL | 0 refills | Status: DC

## 2018-08-16 NOTE — Discharge Instructions (Addendum)
I believe this is most likely a muscle strain in your leg. We will try Ace wrap, rest, elevate the leg and apply ice intermittently Voltaren gel to the area 4 times a day as needed If symptoms worsen significantly over the next 24 to 48 hours please follow-up

## 2018-08-16 NOTE — ED Triage Notes (Signed)
PT reports pain behind left knee that started yesterday. PT reports it started while she was at work moving a bed. PT works in  [employer]. PT reports pain was not immediate. Worse this AM

## 2018-08-17 DIAGNOSIS — R3121 Asymptomatic microscopic hematuria: Secondary | ICD-10-CM | POA: Diagnosis not present

## 2018-08-17 DIAGNOSIS — N952 Postmenopausal atrophic vaginitis: Secondary | ICD-10-CM | POA: Diagnosis not present

## 2018-08-29 ENCOUNTER — Ambulatory Visit (HOSPITAL_COMMUNITY)
Admission: EM | Admit: 2018-08-29 | Discharge: 2018-08-29 | Disposition: A | Discharge status: Home / Self Care | Payer: Medicare Other | Attending: Physician Assistant | Admitting: Physician Assistant

## 2018-08-29 ENCOUNTER — Encounter (HOSPITAL_COMMUNITY): Payer: Self-pay

## 2018-08-29 DIAGNOSIS — M25562 Pain in left knee: Secondary | ICD-10-CM

## 2018-08-29 DIAGNOSIS — M79605 Pain in left leg: Secondary | ICD-10-CM | POA: Insufficient documentation

## 2018-08-29 MED ORDER — PREDNISONE 20 MG PO TABS: 40.0000 mg | ORAL_TABLET | Freq: Every day | ORAL | 0 refills | Status: AC

## 2018-08-29 NOTE — Discharge Instructions (Signed)
Start prednisone 40mg  daily for the next 5 days. Wear knee sleeve during activity. Elevation during rest. Follow up with orthopedics for further evaluation if symptoms not improving.

## 2018-08-29 NOTE — ED Triage Notes (Signed)
Pt is returning for left leg pain. Pt states the pain is shooting down left leg toward her ankle.

## 2018-08-29 NOTE — ED Provider Notes (Signed)
[place]    CSN: [id number] Arrival date & time: 08/29/18  0805     History   Chief Complaint Chief Complaint  Patient presents with  . Leg Pain    Left leg     HPI Heather Ellis is a 72 y.o. female.   72 year old female comes in for continued left leg/knee pain after being seen 08/16/2018. States at the time, she was working, and felt like she pulled a muscle and had pain to the posterior knee. She was told to take NSAIDs, and was given voltaren gel for her symptoms. States symptoms not improving, and now has occasional radiating pain to the lateral leg lower leg. She has noticed worsening pain with ambulation. No obvious swelling, erythema, warmth. She has been taking ibuprofen 400mg  BID without significant relief. States she is on prednisone 5mg  for her right shoulder pain.      Past Medical History:  Diagnosis Date  . Acid reflux   . Asthma   . Hypertension   . Osteoporosis     Patient Active Problem List   Diagnosis Date Noted  . Post-menopause on HRT (hormone replacement therapy) 04/27/2016  . Osteoporosis, unspecified 12/06/2013  . Vaginal atrophy 12/06/2013    Past Surgical History:  Procedure Laterality Date  . ABDOMINAL HYSTERECTOMY     TOTAL ABDOMINAL HYSTERECTOMY   . COLONOSCOPY W/ BIOPSIES  2015  . OOPHORECTOMY     BSO    OB History    Gravida  6   Para  5   Term      Preterm      AB  1   Living  5     SAB  1   TAB      Ectopic      Multiple      Live Births               Home Medications    Prior to Admission medications   Medication Sig Start Date End Date Taking? Authorizing Provider  aspirin 81 MG tablet Take 81 mg by mouth daily.    [provider]  bisoprolol-hydrochlorothiazide (ZIAC) 10-6.25 MG per tablet Take 1 tablet by mouth daily.    [provider]  cholecalciferol (VITAMIN D) 1000 UNITS tablet Take 1,000 Units by mouth daily.    [provider]  diclofenac  sodium (VOLTAREN) 1 % GEL Apply 2 g topically 4 (four) times daily. 08/16/18   Bast, Tressia Miners A, NP  lisinopril (PRINIVIL,ZESTRIL) 10 MG tablet Take 40 mg by mouth daily.     [provider]  mometasone-formoterol (DULERA) 100-5 MCG/ACT AERO Inhale 2 puffs into the lungs 2 (two) times daily.    [provider]  NONFORMULARY OR COMPOUNDED ITEM Estradiol .02% 1 ML Prefilled Applicator Sig: apply vaginally twice a week #90 Day Supply with 4 refills 04/27/16   Terrance Mass, MD  omeprazole (PRILOSEC) 40 MG capsule Take 40 mg by mouth daily.    [provider]  ondansetron (ZOFRAN ODT) 8 MG disintegrating tablet Take 1 tablet (8 mg total) by mouth every 8 (eight) hours as needed for nausea. 11/25/17   Katy Apo, NP  predniSONE (DELTASONE) 20 MG tablet Take 2 tablets (40 mg total) by mouth daily for 5 days. 08/29/18 09/03/18  Ok Edwards, PA-C    Family History Family History  Problem Relation Age of Onset  . Cancer Mother        THROAT- SMOKER  .  Diabetes Mother   . Diabetes Sister   . Heart disease Maternal Aunt   . Stroke Maternal Aunt   . Cancer Maternal Uncle        LUNG -   . Heart disease Maternal Uncle   . Stroke Maternal Uncle   . Ovarian cancer Maternal Grandmother     Social History Social History   Tobacco Use  . Smoking status: Former Smoker    Last attempt to quit: 10/19/2014    Years since quitting: 3.8  . Smokeless tobacco: Never Used  Substance Use Topics  . Alcohol use: No    Alcohol/week: 0.0 standard drinks  . Drug use: No     Allergies   Fosamax [alendronate sodium]   Review of Systems Review of Systems  Reason unable to perform ROS: See HPI as above.     Physical Exam Triage Vital Signs ED Triage Vitals  Enc Vitals Group     BP 08/29/18 0841 (!) 182/83     Pulse Rate 08/29/18 0841 62     Resp 08/29/18 0841 16     Temp 08/29/18 0841 97.7 F (36.5 C)     Temp Source 08/29/18 0841 Oral     SpO2 08/29/18 0841 99 %       Weight --      Height --      Head Circumference --      Peak Flow --      Pain Score 08/29/18 0844 8     Pain Loc --      Pain Edu? --      Excl. in [place]? --    No data found.  Updated Vital Signs BP (!) 182/83 (BP Location: Right Arm)   Pulse 62   Temp 97.7 F (36.5 C) (Oral)   Resp 16   SpO2 99%   Physical Exam Constitutional:      General: She is not in acute distress.    Appearance: She is well-developed. She is not diaphoretic.  HENT:     Head: Normocephalic and atraumatic.  Eyes:     Conjunctiva/sclera: Conjunctivae normal.     Pupils: Pupils are equal, round, and reactive to light.  Musculoskeletal:     Comments: No obvious swelling, erythema, warmth, contusion seen. Tenderness to palpation of bilateral joint line and posterior left knee, all of which is distractible. Full ROM of knee. Strength normal and equal bilaterally. Sensation intact and equal bilaterally. Calf soft to palpation, negative homans.   Neurological:     Mental Status: She is alert and oriented to person, place, and time.      UC Treatments / Results  Labs (all labs ordered are listed, but only abnormal results are displayed) Labs Reviewed - No data to display  EKG None  Radiology No results found.  Procedures Procedures (including critical care time)  Medications Ordered in UC Medications - No data to display  Initial Impression / Assessment and Plan / UC Course  I have reviewed the triage vital signs and the nursing notes.  Pertinent labs & imaging results that were available during my care of the patient were reviewed by me and considered in my medical decision making (see chart for details).    Will increase prednisone to 40mg  for 5 days. Will provide knee sleeve during activity. Rest, elevation. Patient is established with orthopedics, will have patient follow up if symptoms still not improving.  Final Clinical Impressions(s) / UC Diagnoses   Final diagnoses:  Acute  pain of left knee  Left leg pain    ED Prescriptions    Medication Sig Dispense Auth. Provider   predniSONE (DELTASONE) 20 MG tablet Take 2 tablets (40 mg total) by mouth daily for 5 days. 10 tablet Tobin Chad, [place] 08/29/18 [phone]

## 2018-08-31 DIAGNOSIS — R55 Syncope and collapse: Secondary | ICD-10-CM

## 2018-08-31 DIAGNOSIS — Z8616 Personal history of COVID-19: Secondary | ICD-10-CM

## 2018-08-31 HISTORY — DX: Personal history of COVID-19: Z86.16

## 2018-08-31 HISTORY — DX: Syncope and collapse: R55

## 2018-09-07 DIAGNOSIS — M1712 Unilateral primary osteoarthritis, left knee: Secondary | ICD-10-CM | POA: Diagnosis not present

## 2018-09-21 DIAGNOSIS — R1111 Vomiting without nausea: Secondary | ICD-10-CM | POA: Diagnosis not present

## 2018-09-21 DIAGNOSIS — S0990XA Unspecified injury of head, initial encounter: Secondary | ICD-10-CM | POA: Diagnosis not present

## 2018-09-21 DIAGNOSIS — R4020 Unspecified coma: Secondary | ICD-10-CM | POA: Diagnosis not present

## 2018-09-21 DIAGNOSIS — R55 Syncope and collapse: Secondary | ICD-10-CM | POA: Diagnosis not present

## 2018-09-22 ENCOUNTER — Other Ambulatory Visit: Payer: Self-pay

## 2018-09-22 ENCOUNTER — Encounter (HOSPITAL_COMMUNITY): Payer: Self-pay | Admitting: *Deleted

## 2018-09-22 ENCOUNTER — Observation Stay (HOSPITAL_COMMUNITY)
Admission: EM | Admit: 2018-09-22 | Discharge: 2018-09-23 | Disposition: A | Discharge status: Home / Self Care | Payer: Medicare Other | Attending: Internal Medicine | Admitting: Internal Medicine

## 2018-09-22 ENCOUNTER — Emergency Department (HOSPITAL_COMMUNITY): Payer: Medicare Other

## 2018-09-22 DIAGNOSIS — I340 Nonrheumatic mitral (valve) insufficiency: Secondary | ICD-10-CM | POA: Insufficient documentation

## 2018-09-22 DIAGNOSIS — R55 Syncope and collapse: Principal | ICD-10-CM

## 2018-09-22 DIAGNOSIS — M810 Age-related osteoporosis without current pathological fracture: Secondary | ICD-10-CM | POA: Insufficient documentation

## 2018-09-22 DIAGNOSIS — Z79890 Hormone replacement therapy (postmenopausal): Secondary | ICD-10-CM | POA: Insufficient documentation

## 2018-09-22 DIAGNOSIS — J45909 Unspecified asthma, uncomplicated: Secondary | ICD-10-CM | POA: Diagnosis not present

## 2018-09-22 DIAGNOSIS — Z7982 Long term (current) use of aspirin: Secondary | ICD-10-CM | POA: Diagnosis not present

## 2018-09-22 DIAGNOSIS — Z87891 Personal history of nicotine dependence: Secondary | ICD-10-CM | POA: Diagnosis not present

## 2018-09-22 DIAGNOSIS — K219 Gastro-esophageal reflux disease without esophagitis: Secondary | ICD-10-CM | POA: Insufficient documentation

## 2018-09-22 DIAGNOSIS — Z8249 Family history of ischemic heart disease and other diseases of the circulatory system: Secondary | ICD-10-CM | POA: Diagnosis not present

## 2018-09-22 DIAGNOSIS — M069 Rheumatoid arthritis, unspecified: Secondary | ICD-10-CM | POA: Diagnosis not present

## 2018-09-22 DIAGNOSIS — I10 Essential (primary) hypertension: Secondary | ICD-10-CM | POA: Insufficient documentation

## 2018-09-22 DIAGNOSIS — Z79899 Other long term (current) drug therapy: Secondary | ICD-10-CM | POA: Insufficient documentation

## 2018-09-22 DIAGNOSIS — R4020 Unspecified coma: Secondary | ICD-10-CM | POA: Diagnosis not present

## 2018-09-22 LAB — BASIC METABOLIC PANEL
ANION GAP: 9 (ref 5–15)
BUN: 25 mg/dL — ABNORMAL HIGH (ref 8–23)
CALCIUM: 9.7 mg/dL (ref 8.9–10.3)
CO2: 31 mmol/L (ref 22–32)
CREATININE: 1.6 mg/dL — AB (ref 0.44–1.00)
Chloride: 100 mmol/L (ref 98–111)
GFR, EST AFRICAN AMERICAN: 37 mL/min — AB (ref >60)
GFR, EST NON AFRICAN AMERICAN: 32 mL/min — AB (ref >60)
Glucose, Bld: 103 mg/dL — ABNORMAL HIGH (ref 70–99)
Potassium: 3.9 mmol/L (ref 3.5–5.1)
SODIUM: 140 mmol/L (ref 135–145)

## 2018-09-22 LAB — URINALYSIS, ROUTINE W REFLEX MICROSCOPIC
Bilirubin Urine: NEGATIVE
Glucose, UA: NEGATIVE mg/dL
Ketones, ur: NEGATIVE mg/dL
LEUKOCYTES UA: NEGATIVE
Nitrite: NEGATIVE
PH: 5 (ref 5.0–8.0)
Protein, ur: NEGATIVE mg/dL
SPECIFIC GRAVITY, URINE: 1.011 (ref 1.005–1.030)

## 2018-09-22 LAB — CBG MONITORING, ED
GLUCOSE-CAPILLARY: 110 mg/dL — AB (ref 70–99)
Glucose-Capillary: 99 mg/dL (ref 70–99)

## 2018-09-22 LAB — CBC
HCT: 36 % (ref 36.0–46.0)
HCT: 35.3 % — ABNORMAL LOW (ref 36.0–46.0)
Hemoglobin: 10.9 g/dL — ABNORMAL LOW (ref 12.0–15.0)
Hemoglobin: 11.5 g/dL — ABNORMAL LOW (ref 12.0–15.0)
MCH: 28.5 pg (ref 26.0–34.0)
MCH: 29.3 pg (ref 26.0–34.0)
MCHC: 30.3 g/dL (ref 30.0–36.0)
MCHC: 32.6 g/dL (ref 30.0–36.0)
MCV: 94.2 fL (ref 80.0–100.0)
MCV: 90.1 fL (ref 80.0–100.0)
NRBC: 0 % (ref 0.0–0.2)
PLATELETS: 252 10*3/uL (ref 150–400)
Platelets: 207 10*3/uL (ref 150–400)
RBC: 3.82 MIL/uL — ABNORMAL LOW (ref 3.87–5.11)
RBC: 3.92 MIL/uL (ref 3.87–5.11)
RDW: 14.7 % (ref 11.5–15.5)
RDW: 15 % (ref 11.5–15.5)
WBC: 5.8 10*3/uL (ref 4.0–10.5)
WBC: 7.7 10*3/uL (ref 4.0–10.5)
nRBC: 0.5 % — ABNORMAL HIGH (ref 0.0–0.2)

## 2018-09-22 LAB — CREATININE, SERUM
Creatinine, Ser: 1.19 mg/dL — ABNORMAL HIGH (ref 0.44–1.00)
GFR calc Af Amer: 52 mL/min — ABNORMAL LOW (ref >60)
GFR calc non Af Amer: 45 mL/min — ABNORMAL LOW (ref >60)

## 2018-09-22 LAB — TROPONIN I
Troponin I: <0.03 ng/mL (ref <0.03)
Troponin I: 0.03 ng/mL (ref <0.03)

## 2018-09-22 LAB — TSH: TSH: 0.306 u[IU]/mL — ABNORMAL LOW (ref 0.350–4.500)

## 2018-09-22 MED ORDER — ENOXAPARIN SODIUM 40 MG/0.4ML ~~LOC~~ SOLN
40.0000 mg | SUBCUTANEOUS | Status: DC
  Administered 2018-09-22: 40 mg via SUBCUTANEOUS
  Filled 2018-09-22: qty 0.4

## 2018-09-22 MED ORDER — SODIUM CHLORIDE 0.9 % IV SOLN
INTRAVENOUS | Status: DC
  Administered 2018-09-22: 18:00:00 via INTRAVENOUS

## 2018-09-22 MED ORDER — ASPIRIN EC 81 MG PO TBEC
81.0000 mg | DELAYED_RELEASE_TABLET | Freq: Every day | ORAL | Status: DC
  Administered 2018-09-23: 81 mg via ORAL
  Filled 2018-09-22: qty 1

## 2018-09-22 MED ORDER — BISOPROLOL-HYDROCHLOROTHIAZIDE 10-6.25 MG PO TABS
2.0000 | ORAL_TABLET | Freq: Every day | ORAL | Status: DC
  Filled 2018-09-22: qty 2

## 2018-09-22 MED ORDER — LISINOPRIL 40 MG PO TABS
40.0000 mg | ORAL_TABLET | Freq: Every day | ORAL | Status: DC
  Administered 2018-09-23: 40 mg via ORAL
  Filled 2018-09-22: qty 1

## 2018-09-22 MED ORDER — ONDANSETRON HCL 4 MG PO TABS: 4.0000 mg | ORAL_TABLET | Freq: Four times a day (QID) | ORAL | Status: DC | PRN

## 2018-09-22 MED ORDER — SODIUM CHLORIDE 0.9 % IV BOLUS
1000.0000 mL | Freq: Once | INTRAVENOUS | Status: AC
  Administered 2018-09-22: 1000 mL via INTRAVENOUS

## 2018-09-22 MED ORDER — SODIUM CHLORIDE 0.9% FLUSH: 3.0000 mL | INTRAVENOUS | Status: DC | PRN

## 2018-09-22 MED ORDER — SODIUM CHLORIDE 0.9 % IV SOLN: 250.0000 mL | INTRAVENOUS | Status: DC | PRN

## 2018-09-22 MED ORDER — ONDANSETRON HCL 4 MG/2ML IJ SOLN: 4.0000 mg | Freq: Four times a day (QID) | INTRAMUSCULAR | Status: DC | PRN

## 2018-09-22 MED ORDER — SODIUM CHLORIDE 0.9% FLUSH
3.0000 mL | Freq: Two times a day (BID) | INTRAVENOUS | Status: DC
  Administered 2018-09-23: 3 mL via INTRAVENOUS

## 2018-09-22 NOTE — ED Notes (Signed)
Pt brought to rm 17, unresponsive took 4 people to lift patient on bed- pt became incontinent at triage, lips dusky, when getting undressed, pt started to wake up, attempted to place nasal trumpet, pt awake now.

## 2018-09-22 NOTE — ED Triage Notes (Signed)
Pt works in housekeeping at [employer] and had a syncopal episode at work yesterday.  Pt denies any CP or sob with this syncope yesterday. Pt was evaluated by ems at the time and refused transport.  Pt returned to work today and states that she feels "fine". She is brought in by a coworker who states that staff was very concerned and wanted her to be evaluated as they felt that she did not look good.  Pt denies any complaints.

## 2018-09-22 NOTE — ED Provider Notes (Signed)
Medical screening examination/treatment/procedure(s) were conducted as a shared visit with non-physician practitioner(s) and myself.  I personally evaluated the patient during the encounter. Briefly, the patient is a 73 y.o. female with history of hypertension, asthma who presents to the ED following syncopal event.  Patient with unremarkable vitals.  No fever.  Patient with initial syncopal episode yesterday that was fairly unprovoked.  Was seen by EMS yesterday but refused transport.  Friends and family encouraged visit today.  Patient with another syncopal episode while in triage.  Happened shortly after she had blood drawn.  Resolved on its own.  No seizure-like activity.  Patient was possibly bradycardic during the event.  She had sinus bradycardia but no signs of heart block on EKG.  Normal intervals otherwise.  Patient denies any infectious symptoms.  No abdominal pain, chest pain, shortness of breath.  Patient with normal neurological exam.  Otherwise well-appearing.  Normal blood sugar.  Troponin within normal limits.  Creatinine of 1.6 but otherwise unremarkable vitals.  CT of the head showed no acute findings.  Given multiple syncopal events.  Concern for possible cardiac process and will admit for telemetry and further syncope work-up.  Possible dehydration.  Possible arrhythmia.  Would benefit from further telemetry, echocardiogram.  Hemodynamically stable throughout my care.  Admitted to medicine for further care.  This chart was dictated using voice recognition software.  Despite best efforts to proofread,  errors can occur which can change the documentation meaning.    EKG Interpretation  Date/Time:  Thursday September 22 2018 11:48:38 EST Ventricular Rate:  56 PR Interval:  156 QRS Duration: 82 QT Interval:  426 QTC Calculation: 411 R Axis:   42 Text Interpretation:  Sinus bradycardia Left ventricular hypertrophy Abnormal ECG Confirmed by Lennice Sites [phone]) on 09/22/2018 12:17:11 PM           Lennice Sites, DO 09/22/18 1434

## 2018-09-22 NOTE — H&P (Signed)
[facility] H&P    Patient Demographics:    Heather Ellis, is a 73 y.o. female  MRN: [id number]  DOB - 09-19-1945  Admit Date - 09/22/2018  Referring MD/NP/PA: Lennice Sites  Outpatient Primary MD for the patient is Jani Gravel, MD  Patient coming from: Home  Chief complaint-passed out   HPI:    Heather Ellis  is a 73 y.o. female, with a history of rheumatoid arthritis on methotrexate weekly, hypertension, asthma was brought to hospital today as patient had a syncopal episode at work yesterday.  As per patient yesterday morning she was sitting at the meeting at work when she vomited and passed out.  EMS was called but patient refused to come to the hospital.  Today when patient went back to work her coworkers told her that she looked pale and recommended her to go to the ED.  While sitting in the triage in ED patient had another episode of syncope, she again vomited and had bladder incontinence. There was no witnessed tonic-clonic activity, patient was unresponsive for a few minutes.  She was found to have bradycardia with heart rate in 83T and systolic blood pressure in 70s.  She was given IV fluid bolus, and patient became responsive again with no postictal confusion. In the ED CT of the head showed no acute abnormality Lab work has been unremarkable.  UA is clear No history of dysuria or abdominal pain. Complains of heartburn but no chest pain Denies shortness of breath Denies fever or chills. No previous history of stroke or seizures. No history of CAD No history of arrhythmias Does complain of constipation and straining for past 2 days.    Review of systems:    In addition to the HPI above,   All other systems reviewed and are negative.    Past History of the following :    Past Medical History:  Diagnosis Date  . Acid reflux   . Asthma   . Hypertension   . Osteoporosis       Past Surgical  History:  Procedure Laterality Date  . ABDOMINAL HYSTERECTOMY     TOTAL ABDOMINAL HYSTERECTOMY   . COLONOSCOPY W/ BIOPSIES  2015  . OOPHORECTOMY     BSO      Social History:      Social History   Tobacco Use  . Smoking status: Former Smoker    Last attempt to quit: 10/19/2014    Years since quitting: 3.9  . Smokeless tobacco: Never Used  Substance Use Topics  . Alcohol use: No    Alcohol/week: 0.0 standard drinks       Family History :     Family History  Problem Relation Age of Onset  . Cancer Mother        THROAT- SMOKER  . Diabetes Mother   . Diabetes Sister   . Heart disease Maternal Aunt   . Stroke Maternal Aunt   . Cancer Maternal Uncle        LUNG -   . Heart disease Maternal Uncle   .  Stroke Maternal Uncle   . Ovarian cancer Maternal Grandmother       Home Medications:   Prior to Admission medications   Medication Sig Start Date End Date Taking? Authorizing Provider  aspirin 81 MG tablet Take 81 mg by mouth daily.   Yes [provider]  bisoprolol-hydrochlorothiazide (ZIAC) 10-6.25 MG per tablet Take 2 tablets by mouth daily.    Yes [provider]  cholecalciferol (VITAMIN D) 1000 UNITS tablet Take 1,000 Units by mouth daily.   Yes [provider]  diclofenac sodium (VOLTAREN) 1 % GEL Apply 2 g topically 4 (four) times daily. 08/16/18  Yes Bast, Traci A, NP  folic acid (FOLVITE) 1 MG tablet Take 1 mg by mouth daily. 09/11/18  Yes [provider]  lisinopril (PRINIVIL,ZESTRIL) 10 MG tablet Take 40 mg by mouth daily.     [provider]  methotrexate (RHEUMATREX) 2.5 MG tablet Take 10 mg by mouth once a week.  06/15/18   [provider]  NONFORMULARY OR COMPOUNDED ITEM Estradiol .02% 1 ML Prefilled Applicator Sig: apply vaginally twice a week #90 Day Supply with 4 refills Patient not taking: Reported on 09/22/2018 04/27/16   Terrance Mass, MD  ondansetron (ZOFRAN ODT) 8 MG disintegrating tablet  Take 1 tablet (8 mg total) by mouth every 8 (eight) hours as needed for nausea. 11/25/17   Katy Apo, NP     Allergies:     Allergies  Allergen Reactions  . Fosamax [Alendronate Sodium] Nausea And Vomiting     Physical Exam:   Vitals  Blood pressure (!) 159/91, pulse 63, temperature 97.8 F (36.6 C), temperature source Oral, resp. rate (!) 21, height 5\' 3"  (1.6 m), weight 65.8 kg, SpO2 100 %.  1.  General: Appears in no acute distress  2. Psychiatric: Alert, oriented x3, intact insight and judgment  3. Neurologic: Cranial nerves II through grossly intact, moving all extremities  4. HEENMT:  Atraumatic normocephalic, oral mucosa is pink and moist.  No tongue lesions noted  5. Respiratory : Clear to auscultation bilaterally, no wheezing or crackles auscultated  6. Cardiovascular : S1-S2, regular, no murmurs auscultated  7. Gastrointestinal:  Abdomen is soft, nontender, no organomegaly      Data Review:    CBC Recent Labs  Lab 09/22/18 1153  WBC 5.8  HGB 10.9*  HCT 36.0  PLT 252  MCV 94.2  MCH 28.5  MCHC 30.3  RDW 14.7   ------------------------------------------------------------------------------------------------------------------  Results for orders placed or performed during the hospital encounter of 09/22/18 (from the past 48 hour(s))  Basic metabolic panel     Status: Abnormal   Collection Time: 09/22/18 11:53 AM  Result Value Ref Range   Sodium 140 135 - 145 mmol/L   Potassium 3.9 3.5 - 5.1 mmol/L   Chloride 100 98 - 111 mmol/L   CO2 31 22 - 32 mmol/L   Glucose, Bld 103 (H) 70 - 99 mg/dL   BUN 25 (H) 8 - 23 mg/dL   Creatinine, Ser 1.60 (H) 0.44 - 1.00 mg/dL   Calcium 9.7 8.9 - 10.3 mg/dL   GFR calc non Af Amer 32 (L) >60 mL/min   GFR calc Af Amer 37 (L) >60 mL/min   Anion gap 9 5 - 15    Comment: Performed at [facility], [address], [place], [place] [address]  CBC     Status: Abnormal   Collection Time: 09/22/18 11:53  AM  Result Value Ref Range  WBC 5.8 4.0 - 10.5 K/uL   RBC 3.82 (L) 3.87 - 5.11 MIL/uL   Hemoglobin 10.9 (L) 12.0 - 15.0 g/dL   HCT 36.0 36.0 - 46.0 %   MCV 94.2 80.0 - 100.0 fL   MCH 28.5 26.0 - 34.0 pg   MCHC 30.3 30.0 - 36.0 g/dL   RDW 14.7 11.5 - 15.5 %   Platelets 252 150 - 400 K/uL   nRBC 0.0 0.0 - 0.2 %    Comment: Performed at [facility], [place] [address], [place], [place] [address]  CBG monitoring, ED     Status: Abnormal   Collection Time: 09/22/18 11:54 AM  Result Value Ref Range   Glucose-Capillary 110 (H) 70 - 99 mg/dL  Troponin I - ONCE - STAT     Status: None   Collection Time: 09/22/18 12:38 PM  Result Value Ref Range   Troponin I <0.03 <0.03 ng/mL    Comment: Performed at [facility], [place] [address] [place], [place] [address]  CBG monitoring, ED     Status: None   Collection Time: 09/22/18 12:39 PM  Result Value Ref Range   Glucose-Capillary 99 70 - 99 mg/dL  Urinalysis, Routine w reflex microscopic     Status: Abnormal   Collection Time: 09/22/18  2:10 PM  Result Value Ref Range   Color, Urine YELLOW YELLOW   APPearance HAZY (A) CLEAR   Specific Gravity, Urine 1.011 1.005 - 1.030   pH 5.0 5.0 - 8.0   Glucose, UA NEGATIVE NEGATIVE mg/dL   Hgb urine dipstick SMALL (A) NEGATIVE   Bilirubin Urine NEGATIVE NEGATIVE   Ketones, ur NEGATIVE NEGATIVE mg/dL   Protein, ur NEGATIVE NEGATIVE mg/dL   Nitrite NEGATIVE NEGATIVE   Leukocytes, UA NEGATIVE NEGATIVE   RBC / HPF 0-5 0 - 5 RBC/hpf   WBC, UA 0-5 0 - 5 WBC/hpf   Bacteria, UA RARE (A) NONE SEEN   Squamous Epithelial / LPF 0-5 0 - 5   Mucus PRESENT    Hyaline Casts, UA PRESENT     Comment: Performed at [facility], [address], [place], [place] [address]    Chemistries  Recent Labs  Lab 09/22/18 1153  NA 140  K 3.9  CL 100  CO2 31  GLUCOSE 103*  BUN 25*  CREATININE 1.60*  CALCIUM 9.7    ------------------------------------------------------------------------------------------------------------------  ------------------------------------------------------------------------------------------------------------------ GFR: Estimated Creatinine Clearance: 28.6 mL/min (A) (by C-G formula based on SCr of 1.6 mg/dL (H)). Liver Function Tests: No results for input(s): AST, ALT, ALKPHOS, BILITOT, PROT, ALBUMIN in the last 168 hours. No results for input(s): LIPASE, AMYLASE in the last 168 hours. No results for input(s): AMMONIA in the last 168 hours. Coagulation Profile: No results for input(s): INR, PROTIME in the last 168 hours. Cardiac Enzymes: Recent Labs  Lab 09/22/18 1238  TROPONINI <0.03   BNP (last 3 results) No results for input(s): PROBNP in the last 8760 hours. HbA1C: No results for input(s): HGBA1C in the last 72 hours. CBG: Recent Labs  Lab 09/22/18 1154 09/22/18 1239  GLUCAP 110* 99   Lipid Profile: No results for input(s): CHOL, HDL, LDLCALC, TRIG, CHOLHDL, LDLDIRECT in the last 72 hours. Thyroid Function Tests: No results for input(s): TSH, T4TOTAL, FREET4, T3FREE, THYROIDAB in the last 72 hours. Anemia Panel: No results for input(s): VITAMINB12, FOLATE, FERRITIN, TIBC, IRON, RETICCTPCT in the last 72 hours.  --------------------------------------------------------------------------------------------------------------- Urine analysis:    Component Value Date/Time   COLORURINE YELLOW  09/22/2018 1410   APPEARANCEUR HAZY (A) 09/22/2018 1410   LABSPEC 1.011 09/22/2018 1410   PHURINE 5.0 09/22/2018 1410   GLUCOSEU NEGATIVE 09/22/2018 1410   HGBUR SMALL (A) 09/22/2018 1410   BILIRUBINUR NEGATIVE 09/22/2018 1410   KETONESUR NEGATIVE 09/22/2018 1410   PROTEINUR NEGATIVE 09/22/2018 1410   UROBILINOGEN 0.2 03/19/2015 1232   NITRITE NEGATIVE 09/22/2018 1410   LEUKOCYTESUR NEGATIVE 09/22/2018 1410      Imaging Results:    Ct Head Wo  Contrast  Result Date: 09/22/2018 CLINICAL DATA:  73 y/o  F; episode of unresponsiveness. EXAM: CT HEAD WITHOUT CONTRAST TECHNIQUE: Contiguous axial images were obtained from the base of the skull through the vertex without intravenous contrast. COMPARISON:  None. FINDINGS: Brain: No evidence of acute infarction, hemorrhage, hydrocephalus, extra-axial collection or mass lesion/mass effect. Nonspecific white matter hypodensities are compatible with chronic microvascular ischemic changes and there is volume loss of the brain. Small chronic lacunar infarcts are present within the thalami and the right posterior corona radiata. Vascular: No hyperdense vessel or unexpected calcification. Skull: Normal. Negative for fracture or focal lesion. Sinuses/Orbits: No acute finding. Other: None. IMPRESSION: 1. No acute intracranial abnormality identified. 2. Chronic microvascular ischemic changes and volume loss of the brain. Small chronic lacunar infarcts within the thalami and right corona radiata. Electronically Signed   By: Kristine Garbe M.D.   On: 09/22/2018 13:53    My personal review of EKG: Rhythm sinus bradycardia   Assessment & Plan:    Active Problems:   Syncope   1. Syncope-unclear etiology, she did have bradycardia with hypotension in the ED.  Will monitor on telemetry.  Obtain echocardiogram in a.m.  Check orthostatic vital signs every shift.  Obtain EEG in a.m.  Cycle cardiac enzymes every 6 hours x3.  Check TSH.  Started on IV normal saline at 75 mL/h.  2. Hypertension-patient is on multiple antihypertensive medications, will continue these medications to assess orthostatic hypotension while being on these medications.  3. History of rheumatoid arthritis,?  Adrenal insufficiency-patient intermittently takes prednisone, unable to tell me her last dose.  Will check a.m. cortisol, ACTH.  She might need cosyntropin stimulation test.   DVT Prophylaxis-   Lovenox   AM Labs Ordered, also  please review Full Orders  Family Communication: Admission, patients condition and plan of care including tests being ordered have been discussed with the patient and her daughter at bedside* who indicate understanding and agree with the plan and Code Status.  Code Status: Full code  Admission status: Observation: Based on patients clinical presentation and evaluation of above clinical data, I have made determination that patient will need less than 2 midnight stay in the hospital.  Placed in observation for syncope work-up.  Time spent in minutes : 60 minutes   Oswald Hillock M.D on 09/22/2018 at 4:42 PM

## 2018-09-22 NOTE — ED Notes (Signed)
Pt aware of need for urine sample, able to communicate need to use restroom. States "It might be just a little while since I apparently [was incontinent when I passed out]"

## 2018-09-22 NOTE — ED Notes (Signed)
Pt brought back to triage due to becoming unconscious, called for a room. Pt having some some dry heaves

## 2018-09-22 NOTE — ED Notes (Signed)
Patient transported to CT with RN 

## 2018-09-22 NOTE — ED Notes (Signed)
This RN returned to room 18 with pt from CT.

## 2018-09-22 NOTE — ED Notes (Signed)
Spoke to pt about need for urine sample and brought bedside commode to pt room. Pt states she cannot go yet and says she will try when IV fluid is complete

## 2018-09-22 NOTE — ED Provider Notes (Signed)
[facility] EMERGENCY DEPARTMENT Provider Note   CSN: [id number] Arrival date & time: 09/22/18  1114     History   Chief Complaint Chief Complaint  Patient presents with  . Loss of Consciousness    HPI Heather Ellis is a 73 y.o. female.  The history is provided by the patient. No language interpreter was used.  Loss of Consciousness  Episode history:  Multiple Most recent episode:  Yesterday Progression:  Resolved Chronicity:  New Witnessed: yes   Relieved by:  Lying down Worsened by:  Nothing Ineffective treatments:  None tried Associated symptoms: vomiting   Risk factors: no coronary artery disease   Pt had a syncopal episode at work yesterday.  Pt's coworker report pt passed out at work.  Pt refused to come to ED.  Pt was encouraged to come to ED today.  While at triage pt had an episode.  Pt became pale and unresponsive.  I saw pt when she arrived in room.  Pt bradycardic, pale, incontinent of urine.  Pt did not respond to name.  Rn attempted nasal airway  History after pt awake.  Pt reports she ate bojangles yesterday morning.  Pt reports she became nauseated at work yesterday.  Pt reports she does not recall passing out.  Pt reports she vomitted yesterday.  Pt went home and went back to work today.  Pt reports her coworker told her she looked pale and had her come in  Past Medical History:  Diagnosis Date  . Acid reflux   . Asthma   . Hypertension   . Osteoporosis     Patient Active Problem List   Diagnosis Date Noted  . Post-menopause on HRT (hormone replacement therapy) 04/27/2016  . Osteoporosis, unspecified 12/06/2013  . Vaginal atrophy 12/06/2013    Past Surgical History:  Procedure Laterality Date  . ABDOMINAL HYSTERECTOMY     TOTAL ABDOMINAL HYSTERECTOMY   . COLONOSCOPY W/ BIOPSIES  2015  . OOPHORECTOMY     BSO     OB History    Gravida  6   Para  5   Term      Preterm      AB  1   Living  5     SAB  1   TAB       Ectopic      Multiple      Live Births               Home Medications    Prior to Admission medications   Medication Sig Start Date End Date Taking? Authorizing Provider  aspirin 81 MG tablet Take 81 mg by mouth daily.   Yes [provider]  bisoprolol-hydrochlorothiazide (ZIAC) 10-6.25 MG per tablet Take 1 tablet by mouth daily.   Yes [provider]  cholecalciferol (VITAMIN D) 1000 UNITS tablet Take 1,000 Units by mouth daily.   Yes [provider]  diclofenac sodium (VOLTAREN) 1 % GEL Apply 2 g topically 4 (four) times daily. 08/16/18  Yes Bast, Traci A, NP  lisinopril (PRINIVIL,ZESTRIL) 10 MG tablet Take 40 mg by mouth daily.    Yes [provider]  predniSONE (DELTASONE) 20 MG tablet Take 20 mg by mouth daily.   Yes [provider]  NONFORMULARY OR COMPOUNDED ITEM Estradiol .02% 1 ML Prefilled Applicator Sig: apply vaginally twice a week #90 Day Supply with 4 refills 04/27/16   Terrance Mass, MD  omeprazole (PRILOSEC) 40 MG capsule Take 40 mg  by mouth daily.    [provider]  ondansetron (ZOFRAN ODT) 8 MG disintegrating tablet Take 1 tablet (8 mg total) by mouth every 8 (eight) hours as needed for nausea. 11/25/17   Katy Apo, NP    Family History Family History  Problem Relation Age of Onset  . Cancer Mother        THROAT- SMOKER  . Diabetes Mother   . Diabetes Sister   . Heart disease Maternal Aunt   . Stroke Maternal Aunt   . Cancer Maternal Uncle        LUNG -   . Heart disease Maternal Uncle   . Stroke Maternal Uncle   . Ovarian cancer Maternal Grandmother     Social History Social History   Tobacco Use  . Smoking status: Former Smoker    Last attempt to quit: 10/19/2014    Years since quitting: 3.9  . Smokeless tobacco: Never Used  Substance Use Topics  . Alcohol use: No    Alcohol/week: 0.0 standard drinks  . Drug use: No     Allergies   Fosamax [alendronate  sodium]   Review of Systems Review of Systems  Cardiovascular: Positive for syncope.  Gastrointestinal: Positive for vomiting.  All other systems reviewed and are negative.    Physical Exam Updated Vital Signs BP (!) 170/79   Pulse (!) 58   Temp 97.8 F (36.6 C) (Oral)   Resp 19   Ht 5\' 3"  (1.6 m)   Wt 65.8 kg   SpO2 100%   BMI 25.69 kg/m   Physical Exam Vitals signs reviewed.  Constitutional:      General: She is in acute distress.     Appearance: She is ill-appearing and diaphoretic.  HENT:     Head: Normocephalic.     Nose: Nose normal.     Mouth/Throat:     Mouth: Mucous membranes are moist.  Eyes:     Pupils: Pupils are equal, round, and reactive to light.  Neck:     Musculoskeletal: Normal range of motion.  Cardiovascular:     Rate and Rhythm: Normal rate.     Pulses: Normal pulses.  Pulmonary:     Effort: Pulmonary effort is normal.  Abdominal:     General: Abdomen is flat.  Musculoskeletal: Normal range of motion.  Skin:    General: Skin is warm.  Neurological:     General: No focal deficit present.  Psychiatric:        Mood and Affect: Mood normal.      ED Treatments / Results  Labs (all labs ordered are listed, but only abnormal results are displayed) Labs Reviewed  BASIC METABOLIC PANEL - Abnormal; Notable for the following components:      Result Value   Glucose, Bld 103 (*)    BUN 25 (*)    Creatinine, Ser 1.60 (*)    GFR calc non Af Amer 32 (*)    GFR calc Af Amer 37 (*)    All other components within normal limits  CBC - Abnormal; Notable for the following components:   RBC 3.82 (*)    Hemoglobin 10.9 (*)    All other components within normal limits  URINALYSIS, ROUTINE W REFLEX MICROSCOPIC - Abnormal; Notable for the following components:   APPearance HAZY (*)    Hgb urine dipstick SMALL (*)    Bacteria, UA RARE (*)    All other components within normal limits  CBG MONITORING, ED - Abnormal; Notable  for the following  components:   Glucose-Capillary 110 (*)    All other components within normal limits  TROPONIN I  CBG MONITORING, ED    EKG EKG Interpretation  Date/Time:  Thursday September 22 2018 11:48:38 EST Ventricular Rate:  56 PR Interval:  156 QRS Duration: 82 QT Interval:  426 QTC Calculation: 411 R Axis:   42 Text Interpretation:  Sinus bradycardia Left ventricular hypertrophy Abnormal ECG Confirmed by Lennice Sites [phone]) on 09/22/2018 12:17:11 PM Also confirmed by Lennice Sites [id number]), editor Shon Hale [phone])  on 09/22/2018 1:24:24 PM   Radiology Ct Head Wo Contrast  Result Date: 09/22/2018 CLINICAL DATA:  73 y/o  F; episode of unresponsiveness. EXAM: CT HEAD WITHOUT CONTRAST TECHNIQUE: Contiguous axial images were obtained from the base of the skull through the vertex without intravenous contrast. COMPARISON:  None. FINDINGS: Brain: No evidence of acute infarction, hemorrhage, hydrocephalus, extra-axial collection or mass lesion/mass effect. Nonspecific white matter hypodensities are compatible with chronic microvascular ischemic changes and there is volume loss of the brain. Small chronic lacunar infarcts are present within the thalami and the right posterior corona radiata. Vascular: No hyperdense vessel or unexpected calcification. Skull: Normal. Negative for fracture or focal lesion. Sinuses/Orbits: No acute finding. Other: None. IMPRESSION: 1. No acute intracranial abnormality identified. 2. Chronic microvascular ischemic changes and volume loss of the brain. Small chronic lacunar infarcts within the thalami and right corona radiata. Electronically Signed   By: Kristine Garbe M.D.   On: 09/22/2018 13:53    Procedures .Critical Care Performed by: Fransico Meadow, PA-C Authorized by: Fransico Meadow, PA-C   Critical care provider statement:    Critical care time (minutes):  45   Critical care start time:  09/22/2018 12:00 PM   Critical care end time:  09/22/2018 3:17  PM   Critical care time was exclusive of:  Separately billable procedures and treating other patients   Critical care was necessary to treat or prevent imminent or life-threatening deterioration of the following conditions:  Cardiac failure, CNS failure or compromise and circulatory failure   Critical care was time spent personally by me on the following activities:  Discussions with consultants, evaluation of patient's response to treatment, examination of patient, ordering and performing treatments and interventions, ordering and review of laboratory studies, ordering and review of radiographic studies, pulse oximetry, re-evaluation of patient's condition, obtaining history from patient or surrogate, review of old charts and blood draw for specimens   (including critical care time)  Medications Ordered in ED Medications  sodium chloride 0.9 % bolus 1,000 mL (0 mLs Intravenous Stopped 09/22/18 1400)     Initial Impression / Assessment and Plan / ED Course  I have reviewed the triage vital signs and the nursing notes.  Pertinent labs & imaging results that were available during my care of the patient were reviewed by me and considered in my medical decision making (see chart for details).    MDM  Pt began responding when RN attempted nasal ariway. Pt placed on a monitor heart rate low 40's.  Blood pressure low 70's. V fluids started.  Pt has had improvement in blood pressure and pulse.   Pt awake, talking,  Color improved.   Pt reports she is feeling better.   Final Clinical Impressions(s) / ED Diagnoses   Final diagnoses:  Syncope and collapse    ED Discharge Orders    None       Fransico Meadow, [place] 09/22/18 1653  Lennice Sites, DO 09/23/18 [phone]

## 2018-09-23 ENCOUNTER — Ambulatory Visit (HOSPITAL_BASED_OUTPATIENT_CLINIC_OR_DEPARTMENT_OTHER): Payer: Medicare Other

## 2018-09-23 ENCOUNTER — Observation Stay (HOSPITAL_COMMUNITY): Payer: Medicare Other

## 2018-09-23 DIAGNOSIS — I340 Nonrheumatic mitral (valve) insufficiency: Secondary | ICD-10-CM

## 2018-09-23 DIAGNOSIS — R55 Syncope and collapse: Secondary | ICD-10-CM

## 2018-09-23 LAB — COMPREHENSIVE METABOLIC PANEL
ALK PHOS: 47 U/L (ref 38–126)
ALT: 13 U/L (ref 0–44)
AST: 15 U/L (ref 15–41)
Albumin: 3.2 g/dL — ABNORMAL LOW (ref 3.5–5.0)
Anion gap: 10 (ref 5–15)
BUN: 19 mg/dL (ref 8–23)
CO2: 27 mmol/L (ref 22–32)
CREATININE: 0.85 mg/dL (ref 0.44–1.00)
Calcium: 9 mg/dL (ref 8.9–10.3)
Chloride: 104 mmol/L (ref 98–111)
GFR calc Af Amer: >60 mL/min (ref >60)
GFR calc non Af Amer: >60 mL/min (ref >60)
Glucose, Bld: 90 mg/dL (ref 70–99)
Potassium: 3.3 mmol/L — ABNORMAL LOW (ref 3.5–5.1)
Sodium: 141 mmol/L (ref 135–145)
Total Bilirubin: 1.3 mg/dL — ABNORMAL HIGH (ref 0.3–1.2)
Total Protein: 6 g/dL — ABNORMAL LOW (ref 6.5–8.1)

## 2018-09-23 LAB — CBC
HCT: 33.4 % — ABNORMAL LOW (ref 36.0–46.0)
Hemoglobin: 10.5 g/dL — ABNORMAL LOW (ref 12.0–15.0)
MCH: 29.2 pg (ref 26.0–34.0)
MCHC: 31.4 g/dL (ref 30.0–36.0)
MCV: 92.8 fL (ref 80.0–100.0)
Platelets: 206 10*3/uL (ref 150–400)
RBC: 3.6 MIL/uL — ABNORMAL LOW (ref 3.87–5.11)
RDW: 14.8 % (ref 11.5–15.5)
WBC: 4.8 10*3/uL (ref 4.0–10.5)
nRBC: 0 % (ref 0.0–0.2)

## 2018-09-23 LAB — T4, FREE: Free T4: 0.97 ng/dL (ref 0.82–1.77)

## 2018-09-23 LAB — ECHOCARDIOGRAM COMPLETE
Height: 63 in
Weight: 2320 oz

## 2018-09-23 LAB — TROPONIN I
Troponin I: <0.03 ng/mL (ref <0.03)
Troponin I: 0.03 ng/mL (ref <0.03)

## 2018-09-23 LAB — CORTISOL-AM, BLOOD: Cortisol - AM: 6.4 ug/dL — ABNORMAL LOW (ref 6.7–22.6)

## 2018-09-23 MED ORDER — SENNOSIDES-DOCUSATE SODIUM 8.6-50 MG PO TABS: 1.0000 | ORAL_TABLET | Freq: Every evening | ORAL | Status: DC | PRN

## 2018-09-23 MED ORDER — AMLODIPINE BESYLATE 5 MG PO TABS: 5.0000 mg | ORAL_TABLET | Freq: Every day | ORAL | 0 refills | Status: DC

## 2018-09-23 MED ORDER — POTASSIUM CHLORIDE CRYS ER 20 MEQ PO TBCR
40.0000 meq | EXTENDED_RELEASE_TABLET | Freq: Once | ORAL | Status: AC
  Administered 2018-09-23: 40 meq via ORAL
  Filled 2018-09-23: qty 2

## 2018-09-23 NOTE — Procedures (Signed)
History: 73 yo F being evaluated for syncope  Sedation: None  Technique: This is a 21 channel routine scalp EEG performed at the bedside with bipolar and monopolar montages arranged in accordance to the international 10/20 system of electrode placement. One channel was dedicated to EKG recording.    Background: The background consists of intermixed alpha and beta activities. There is a well defined posterior dominant rhythm of 10-11 Hz that attenuates with eye opening. Sleep is recorded with normal appearing structures.   Photic stimulation: Physiologic driving is not performed  EEG Abnormalities: None  Clinical Interpretation: This normal EEG is recorded in the waking and sleep state. There was no seizure or seizure predisposition recorded on this study. Please note that lack of epileptiform activity on EEG does not preclude the possibility of epilepsy.   Roland Rack, MD [facility] [phone]  If 7pm- 7am, please page neurology on call as listed in [place].

## 2018-09-23 NOTE — Discharge Summary (Signed)
Physician Discharge Summary  Heather Ellis ZPH:[id number] DOB: 1945-09-27 DOA: 09/22/2018  PCP: Jani Gravel, MD  Admit date: 09/22/2018 Discharge date: 09/23/2018  Admitted From: home Discharge disposition: home   Recommendations for Outpatient Follow-Up:   1. Stricter low Na diet 2. Proper hydration   Discharge Diagnosis:   Active Problems:   Syncope    Discharge Condition: Improved.  Diet recommendation: Low sodium, heart healthy  Wound care: None.  Code status: Full.   History of Present Illness:   Heather Ellis  is a 73 y.o. female, with a history of rheumatoid arthritis on methotrexate weekly, hypertension, asthma was brought to hospital today as patient had a syncopal episode at work yesterday.  As per patient yesterday morning she was sitting at the meeting at work when she vomited and passed out.  EMS was called but patient refused to come to the hospital.  Today when patient went back to work her coworkers told her that she looked pale and recommended her to go to the ED.  While sitting in the triage in ED patient had another episode of syncope, she again vomited and had bladder incontinence.   Hospital Course by Problem:   Syncope- -suspect vasovagal+/- bradycardia -did have bradycardia with hypotension in the ED with AKI -EEG negative -off BB, HR much improved-- HR climbs with exertion -CEflat -TSH low but free t4 normal -CR back to normal after IVF -echo: Normal LV systolic function; probable mild diastolic dysfunction;   trace AI; eccentric, anteriorly directed MR (mild to moderate)   suggestion prolapse of posterior MV leaflet.  Hypertension -adjustment of medications, avoiding BB -may need to resume a diuretic      Medical Consultants:      Discharge Exam:   Vitals:   09/22/18 1615 09/23/18 0432  BP: 104/90 (!) 163/88  Pulse: 67 67  Resp: 17 18  Temp:  98.7 F (37.1 C)  SpO2: 96% 95%   Vitals:   09/22/18 1430  09/22/18 1615 09/22/18 1742 09/23/18 0432  BP: (!) 159/91 104/90  (!) 163/88  Pulse: 63 67  67  Resp: (!) 21 17  18   Temp:    98.7 F (37.1 C)  TempSrc:    Oral  SpO2: 100% 96%  95%  Weight:   65.8 kg   Height:   5\' 3"  (1.6 m)     General exam: Appears calm and comfortable.   The results of significant diagnostics from this hospitalization (including imaging, microbiology, ancillary and laboratory) are listed below for reference.     Procedures and Diagnostic Studies:   Ct Head Wo Contrast  Result Date: 09/22/2018 CLINICAL DATA:  73 y/o  F; episode of unresponsiveness. EXAM: CT HEAD WITHOUT CONTRAST TECHNIQUE: Contiguous axial images were obtained from the base of the skull through the vertex without intravenous contrast. COMPARISON:  None. FINDINGS: Brain: No evidence of acute infarction, hemorrhage, hydrocephalus, extra-axial collection or mass lesion/mass effect. Nonspecific white matter hypodensities are compatible with chronic microvascular ischemic changes and there is volume loss of the brain. Small chronic lacunar infarcts are present within the thalami and the right posterior corona radiata. Vascular: No hyperdense vessel or unexpected calcification. Skull: Normal. Negative for fracture or focal lesion. Sinuses/Orbits: No acute finding. Other: None. IMPRESSION: 1. No acute intracranial abnormality identified. 2. Chronic microvascular ischemic changes and volume loss of the brain. Small chronic lacunar infarcts within the thalami and right corona radiata. Electronically Signed   By: Edgardo Roys.D.  On: 09/22/2018 13:53     Labs:   Basic Metabolic Panel: Recent Labs  Lab 09/22/18 1153 09/22/18 1923 09/23/18 0528  NA 140  --  141  K 3.9  --  3.3*  CL 100  --  104  CO2 31  --  27  GLUCOSE 103*  --  90  BUN 25*  --  19  CREATININE 1.60* 1.19* 0.85  CALCIUM 9.7  --  9.0   GFR Estimated Creatinine Clearance: 53.8 mL/min (by C-G formula based on SCr of 0.85  mg/dL). Liver Function Tests: Recent Labs  Lab 09/23/18 0528  AST 15  ALT 13  ALKPHOS 47  BILITOT 1.3*  PROT 6.0*  ALBUMIN 3.2*   No results for input(s): LIPASE, AMYLASE in the last 168 hours. No results for input(s): AMMONIA in the last 168 hours. Coagulation profile No results for input(s): INR, PROTIME in the last 168 hours.  CBC: Recent Labs  Lab 09/22/18 1153 09/22/18 1923 09/23/18 0528  WBC 5.8 7.7 4.8  HGB 10.9* 11.5* 10.5*  HCT 36.0 35.3* 33.4*  MCV 94.2 90.1 92.8  PLT 252 207 206   Cardiac Enzymes: Recent Labs  Lab 09/22/18 1238 09/22/18 1923 09/22/18 2318 09/23/18 0528  TROPONINI <0.03 0.03* <0.03 0.03*   BNP: Invalid input(s): POCBNP CBG: Recent Labs  Lab 09/22/18 1154 09/22/18 1239  GLUCAP 110* 99   D-Dimer No results for input(s): DDIMER in the last 72 hours. Hgb A1c No results for input(s): HGBA1C in the last 72 hours. Lipid Profile No results for input(s): CHOL, HDL, LDLCALC, TRIG, CHOLHDL, LDLDIRECT in the last 72 hours. Thyroid function studies Recent Labs    09/22/18 1923  TSH 0.306*   Anemia work up No results for input(s): VITAMINB12, FOLATE, FERRITIN, TIBC, IRON, RETICCTPCT in the last 72 hours. Microbiology No results found for this or any previous visit (from the past 240 hour(s)).   Discharge Instructions:   Discharge Instructions    Diet - low sodium heart healthy   Complete by:  As directed    Increase activity slowly   Complete by:  As directed      Allergies as of 09/23/2018      Reactions   Fosamax [alendronate Sodium] Nausea And Vomiting      Medication List    STOP taking these medications   bisoprolol-hydrochlorothiazide 10-6.25 MG tablet Commonly known as:  ZIAC   NONFORMULARY OR COMPOUNDED ITEM     TAKE these medications   amLODipine 5 MG tablet Commonly known as:  NORVASC Take 1 tablet (5 mg total) by mouth daily.   aspirin 81 MG tablet Take 81 mg by mouth daily.   cholecalciferol 1000  units tablet Commonly known as:  VITAMIN D Take 1,000 Units by mouth daily.   diclofenac sodium 1 % Gel Commonly known as:  VOLTAREN Apply 2 g topically 4 (four) times daily.   folic acid 1 MG tablet Commonly known as:  FOLVITE Take 1 mg by mouth daily.   lisinopril 10 MG tablet Commonly known as:  PRINIVIL,ZESTRIL Take 40 mg by mouth daily.   methotrexate 2.5 MG tablet Commonly known as:  RHEUMATREX Take 10 mg by mouth once a week.   ondansetron 8 MG disintegrating tablet Commonly known as:  ZOFRAN ODT Take 1 tablet (8 mg total) by mouth every 8 (eight) hours as needed for nausea.   senna-docusate 8.6-50 MG tablet Commonly known as:  Senokot-S Take 1 tablet by mouth at bedtime as needed for mild  constipation.      Follow-up Information    Jani Gravel, MD Follow up in 1 week(s).   Specialty:  Internal Medicine Why:  BP check Contact information: [address] [place] [address] [phone]            Time coordinating discharge: 25 min  Signed:  Geradine Girt DO  [facility] 09/23/2018, 1:12 PM

## 2018-09-23 NOTE — Progress Notes (Signed)
EEG Completed; Results Pending  

## 2018-09-23 NOTE — Progress Notes (Signed)
  Echocardiogram 2D Echocardiogram has been performed.  Jennette Dubin 09/23/2018, 10:32 AM

## 2018-09-24 LAB — ACTH: C206 ACTH: 19.9 pg/mL (ref 7.2–63.3)

## 2018-09-29 DIAGNOSIS — I10 Essential (primary) hypertension: Secondary | ICD-10-CM | POA: Diagnosis not present

## 2018-09-29 DIAGNOSIS — R55 Syncope and collapse: Secondary | ICD-10-CM | POA: Diagnosis not present

## 2018-09-29 DIAGNOSIS — I639 Cerebral infarction, unspecified: Secondary | ICD-10-CM | POA: Diagnosis not present

## 2018-10-06 DIAGNOSIS — R42 Dizziness and giddiness: Secondary | ICD-10-CM | POA: Diagnosis not present

## 2018-10-06 DIAGNOSIS — R739 Hyperglycemia, unspecified: Secondary | ICD-10-CM | POA: Diagnosis not present

## 2018-10-06 DIAGNOSIS — E559 Vitamin D deficiency, unspecified: Secondary | ICD-10-CM | POA: Diagnosis not present

## 2018-10-06 DIAGNOSIS — I10 Essential (primary) hypertension: Secondary | ICD-10-CM | POA: Diagnosis not present

## 2018-10-10 ENCOUNTER — Observation Stay (HOSPITAL_COMMUNITY)
Admission: EM | Admit: 2018-10-10 | Discharge: 2018-10-11 | Disposition: A | Discharge status: Home / Self Care | Payer: Medicare Other | Attending: Internal Medicine | Admitting: Internal Medicine

## 2018-10-10 ENCOUNTER — Emergency Department (HOSPITAL_COMMUNITY): Payer: Medicare Other

## 2018-10-10 ENCOUNTER — Observation Stay (HOSPITAL_COMMUNITY): Payer: Medicare Other

## 2018-10-10 ENCOUNTER — Other Ambulatory Visit: Payer: Self-pay

## 2018-10-10 ENCOUNTER — Encounter (HOSPITAL_COMMUNITY): Payer: Self-pay

## 2018-10-10 DIAGNOSIS — M069 Rheumatoid arthritis, unspecified: Secondary | ICD-10-CM | POA: Insufficient documentation

## 2018-10-10 DIAGNOSIS — R569 Unspecified convulsions: Secondary | ICD-10-CM | POA: Diagnosis not present

## 2018-10-10 DIAGNOSIS — E860 Dehydration: Secondary | ICD-10-CM | POA: Insufficient documentation

## 2018-10-10 DIAGNOSIS — J45909 Unspecified asthma, uncomplicated: Secondary | ICD-10-CM | POA: Insufficient documentation

## 2018-10-10 DIAGNOSIS — N179 Acute kidney failure, unspecified: Secondary | ICD-10-CM

## 2018-10-10 DIAGNOSIS — R05 Cough: Secondary | ICD-10-CM | POA: Diagnosis present

## 2018-10-10 DIAGNOSIS — Z87891 Personal history of nicotine dependence: Secondary | ICD-10-CM | POA: Diagnosis not present

## 2018-10-10 DIAGNOSIS — I10 Essential (primary) hypertension: Secondary | ICD-10-CM

## 2018-10-10 DIAGNOSIS — K219 Gastro-esophageal reflux disease without esophagitis: Secondary | ICD-10-CM

## 2018-10-10 DIAGNOSIS — Z79899 Other long term (current) drug therapy: Secondary | ICD-10-CM | POA: Diagnosis not present

## 2018-10-10 DIAGNOSIS — R42 Dizziness and giddiness: Secondary | ICD-10-CM | POA: Diagnosis not present

## 2018-10-10 DIAGNOSIS — Z7982 Long term (current) use of aspirin: Secondary | ICD-10-CM | POA: Insufficient documentation

## 2018-10-10 DIAGNOSIS — R55 Syncope and collapse: Secondary | ICD-10-CM | POA: Diagnosis not present

## 2018-10-10 DIAGNOSIS — Z791 Long term (current) use of non-steroidal anti-inflammatories (NSAID): Secondary | ICD-10-CM | POA: Diagnosis not present

## 2018-10-10 DIAGNOSIS — M79672 Pain in left foot: Secondary | ICD-10-CM

## 2018-10-10 DIAGNOSIS — M7730 Calcaneal spur, unspecified foot: Secondary | ICD-10-CM | POA: Diagnosis not present

## 2018-10-10 DIAGNOSIS — R159 Full incontinence of feces: Secondary | ICD-10-CM | POA: Diagnosis present

## 2018-10-10 LAB — URINALYSIS, ROUTINE W REFLEX MICROSCOPIC
Bilirubin Urine: NEGATIVE
Glucose, UA: NEGATIVE mg/dL
Ketones, ur: NEGATIVE mg/dL
NITRITE: NEGATIVE
Protein, ur: 30 mg/dL — AB
Specific Gravity, Urine: 1.013 (ref 1.005–1.030)
pH: 5 (ref 5.0–8.0)

## 2018-10-10 LAB — BASIC METABOLIC PANEL
Anion gap: 11 (ref 5–15)
BUN: 21 mg/dL (ref 8–23)
CO2: 30 mmol/L (ref 22–32)
Calcium: 10.4 mg/dL — ABNORMAL HIGH (ref 8.9–10.3)
Chloride: 99 mmol/L (ref 98–111)
Creatinine, Ser: 1.64 mg/dL — ABNORMAL HIGH (ref 0.44–1.00)
GFR calc Af Amer: 36 mL/min — ABNORMAL LOW (ref >60)
GFR calc non Af Amer: 31 mL/min — ABNORMAL LOW (ref >60)
Glucose, Bld: 144 mg/dL — ABNORMAL HIGH (ref 70–99)
Potassium: 3.8 mmol/L (ref 3.5–5.1)
Sodium: 140 mmol/L (ref 135–145)

## 2018-10-10 LAB — CBC
HEMATOCRIT: 38.1 % (ref 36.0–46.0)
Hemoglobin: 11.5 g/dL — ABNORMAL LOW (ref 12.0–15.0)
MCH: 28.5 pg (ref 26.0–34.0)
MCHC: 30.2 g/dL (ref 30.0–36.0)
MCV: 94.3 fL (ref 80.0–100.0)
Platelets: 256 10*3/uL (ref 150–400)
RBC: 4.04 MIL/uL (ref 3.87–5.11)
RDW: 14.5 % (ref 11.5–15.5)
WBC: 9.1 10*3/uL (ref 4.0–10.5)
nRBC: 0 % (ref 0.0–0.2)

## 2018-10-10 LAB — DIFFERENTIAL
Abs Immature Granulocytes: 0.07 10*3/uL (ref 0.00–0.07)
Basophils Absolute: 0 10*3/uL (ref 0.0–0.1)
Basophils Relative: 0 %
EOS PCT: 0 %
Eosinophils Absolute: 0 10*3/uL (ref 0.0–0.5)
IMMATURE GRANULOCYTES: 1 %
Lymphocytes Relative: 24 %
Lymphs Abs: 2.2 10*3/uL (ref 0.7–4.0)
Monocytes Absolute: 0.9 10*3/uL (ref 0.1–1.0)
Monocytes Relative: 10 %
Neutro Abs: 5.8 10*3/uL (ref 1.7–7.7)
Neutrophils Relative %: 65 %

## 2018-10-10 LAB — APTT: aPTT: 26 seconds (ref 24–36)

## 2018-10-10 LAB — PROTIME-INR
INR: 1.04
Prothrombin Time: 13.6 seconds (ref 11.4–15.2)

## 2018-10-10 LAB — CBG MONITORING, ED: Glucose-Capillary: 142 mg/dL — ABNORMAL HIGH (ref 70–99)

## 2018-10-10 MED ORDER — AMLODIPINE BESYLATE 5 MG PO TABS
5.0000 mg | ORAL_TABLET | Freq: Every day | ORAL | Status: DC
  Administered 2018-10-11: 5 mg via ORAL
  Filled 2018-10-10 (×2): qty 1

## 2018-10-10 MED ORDER — ENSURE ENLIVE PO LIQD
237.0000 mL | Freq: Two times a day (BID) | ORAL | Status: DC
  Administered 2018-10-11: 237 mL via ORAL

## 2018-10-10 MED ORDER — FOLIC ACID 1 MG PO TABS
1.0000 mg | ORAL_TABLET | Freq: Every day | ORAL | Status: DC
  Administered 2018-10-11: 1 mg via ORAL
  Filled 2018-10-10: qty 1

## 2018-10-10 MED ORDER — SODIUM CHLORIDE 0.9 % IV SOLN
INTRAVENOUS | Status: DC
  Administered 2018-10-10: 100 mL/h via INTRAVENOUS
  Administered 2018-10-10 – 2018-10-11 (×3): via INTRAVENOUS

## 2018-10-10 MED ORDER — ENOXAPARIN SODIUM 30 MG/0.3ML ~~LOC~~ SOLN
30.0000 mg | SUBCUTANEOUS | Status: DC
  Administered 2018-10-10: 30 mg via SUBCUTANEOUS
  Filled 2018-10-10: qty 0.3

## 2018-10-10 MED ORDER — SENNOSIDES-DOCUSATE SODIUM 8.6-50 MG PO TABS: 1.0000 | ORAL_TABLET | Freq: Every evening | ORAL | Status: DC | PRN

## 2018-10-10 MED ORDER — SODIUM CHLORIDE 0.9% FLUSH
3.0000 mL | Freq: Once | INTRAVENOUS | Status: AC
  Administered 2018-10-10: 3 mL via INTRAVENOUS

## 2018-10-10 MED ORDER — VITAMIN D 25 MCG (1000 UNIT) PO TABS
1000.0000 [IU] | ORAL_TABLET | Freq: Every day | ORAL | Status: DC
  Administered 2018-10-11: 1000 [IU] via ORAL
  Filled 2018-10-10: qty 1

## 2018-10-10 MED ORDER — SODIUM CHLORIDE 0.9 % IV BOLUS
1000.0000 mL | Freq: Once | INTRAVENOUS | Status: AC
  Administered 2018-10-10: 1000 mL via INTRAVENOUS

## 2018-10-10 MED ORDER — ASPIRIN EC 81 MG PO TBEC
81.0000 mg | DELAYED_RELEASE_TABLET | Freq: Every day | ORAL | Status: DC
  Administered 2018-10-11: 81 mg via ORAL
  Filled 2018-10-10: qty 1

## 2018-10-10 MED ORDER — ACETAMINOPHEN 325 MG PO TABS: 650.0000 mg | ORAL_TABLET | Freq: Four times a day (QID) | ORAL | Status: DC | PRN

## 2018-10-10 MED ORDER — ONDANSETRON 4 MG PO TBDP: 8.0000 mg | ORAL_TABLET | Freq: Three times a day (TID) | ORAL | Status: DC | PRN

## 2018-10-10 NOTE — ED Provider Notes (Signed)
[facility]ER[facility]ENC[facility] DEPARTMENT Provider Note   CSN: [id number] Arrival date & time: 10/10/18  1318     History   Chief Complaint Chief Complaint  Patient presents with  . Loss of Consciousness    HPI Heather Ellis is a 73 y.o. female.  She is here after a syncopal event at work today.  She said she was sitting in a chair in the break room having lunch.  The next thing she remembers the paramedics were there.  She felt a little nauseous and then was incontinent of stool and bowel.  She said she feels back to baseline now.  She felt she did not get a good night sleep and woke up around 4 AM and was restless and then went back to sleep for a little while before she got up at 530 to get ready.  She was able to drive herself to work.  She denies any preceding chest pain numbness or weakness.  Interestingly she had a similar event 2 weeks ago and was admitted for syncope.  She ended up getting a head CT EEG.  The history is provided by the patient.  Loss of Consciousness  Episode history:  Single Most recent episode:  Today Duration: unknown. Progression:  Resolved Chronicity:  Recurrent Context: sitting down   Witnessed: no   Relieved by: time. Worsened by:  Nothing Ineffective treatments:  None tried Associated symptoms: dizziness, nausea and vomiting   Associated symptoms: no chest pain, no fever, no headaches, no recent surgery and no shortness of breath     Past Medical History:  Diagnosis Date  . Acid reflux   . Asthma   . Hypertension   . Osteoporosis     Patient Active Problem List   Diagnosis Date Noted  . Syncope 09/22/2018  . Post-menopause on HRT (hormone replacement therapy) 04/27/2016  . Osteoporosis, unspecified 12/06/2013  . Vaginal atrophy 12/06/2013    Past Surgical History:  Procedure Laterality Date  . ABDOMINAL HYSTERECTOMY     TOTAL ABDOMINAL HYSTERECTOMY   . COLONOSCOPY W/ BIOPSIES  2015  . OOPHORECTOMY     BSO      OB History    Gravida  6   Para  5   Term      Preterm      AB  1   Living  5     SAB  1   TAB      Ectopic      Multiple      Live Births               Home Medications    Prior to Admission medications   Medication Sig Start Date End Date Taking? Authorizing Provider  amLODipine (NORVASC) 5 MG tablet Take 1 tablet (5 mg total) by mouth daily. 09/23/18   Geradine Girt, DO  aspirin 81 MG tablet Take 81 mg by mouth daily.    [provider]  cholecalciferol (VITAMIN D) 1000 UNITS tablet Take 1,000 Units by mouth daily.    [provider]  diclofenac sodium (VOLTAREN) 1 % GEL Apply 2 g topically 4 (four) times daily. 08/16/18   Loura Halt A, NP  folic acid (FOLVITE) 1 MG tablet Take 1 mg by mouth daily. 09/11/18   [provider]  lisinopril (PRINIVIL,ZESTRIL) 10 MG tablet Take 40 mg by mouth daily.     [provider]  methotrexate (RHEUMATREX) 2.5 MG tablet Take 10 mg by mouth once  a week.  06/15/18   [provider]  ondansetron (ZOFRAN ODT) 8 MG disintegrating tablet Take 1 tablet (8 mg total) by mouth every 8 (eight) hours as needed for nausea. 11/25/17   Katy Apo, NP  senna-docusate (SENOKOT-S) 8.6-50 MG tablet Take 1 tablet by mouth at bedtime as needed for mild constipation. 09/23/18   Geradine Girt, DO    Family History Family History  Problem Relation Age of Onset  . Cancer Mother        THROAT- SMOKER  . Diabetes Mother   . Diabetes Sister   . Heart disease Maternal Aunt   . Stroke Maternal Aunt   . Cancer Maternal Uncle        LUNG -   . Heart disease Maternal Uncle   . Stroke Maternal Uncle   . Ovarian cancer Maternal Grandmother     Social History Social History   Tobacco Use  . Smoking status: Former Smoker    Last attempt to quit: 10/19/2014    Years since quitting: 3.9  . Smokeless tobacco: Never Used  Substance Use Topics  . Alcohol use: No    Alcohol/week: 0.0 standard  drinks  . Drug use: No     Allergies   Fosamax [alendronate sodium]   Review of Systems Review of Systems  Constitutional: Negative for fever.  HENT: Negative for sore throat.   Eyes: Negative for visual disturbance.  Respiratory: Negative for shortness of breath.   Cardiovascular: Positive for syncope. Negative for chest pain.  Gastrointestinal: Positive for nausea and vomiting. Negative for abdominal pain.  Genitourinary: Negative for dysuria.  Musculoskeletal: Negative for neck pain.  Skin: Negative for rash.  Neurological: Positive for dizziness. Negative for headaches.     Physical Exam Updated Vital Signs BP 119/69   Pulse 86   Temp 98.5 F (36.9 C) (Oral)   Resp (!) 21   Ht 5\' 3"  (1.6 m)   SpO2 100%   BMI 25.69 kg/m   Physical Exam Vitals signs and nursing note reviewed.  Constitutional:      General: She is not in acute distress.    Appearance: She is well-developed.  HENT:     Head: Normocephalic and atraumatic.  Eyes:     Conjunctiva/sclera: Conjunctivae normal.  Neck:     Musculoskeletal: Neck supple.  Cardiovascular:     Rate and Rhythm: Normal rate and regular rhythm.     Heart sounds: No murmur.  Pulmonary:     Effort: Pulmonary effort is normal. No respiratory distress.     Breath sounds: Normal breath sounds.  Abdominal:     Palpations: Abdomen is soft.     Tenderness: There is no abdominal tenderness.  Musculoskeletal:        General: No swelling or deformity.     Right lower leg: No edema.     Left lower leg: No edema.  Skin:    General: Skin is warm and dry.     Capillary Refill: Capillary refill takes less than 2 seconds.  Neurological:     General: No focal deficit present.     Mental Status: She is alert. Mental status is at baseline.     Cranial Nerves: No cranial nerve deficit.     Sensory: No sensory deficit.     Motor: No weakness.     Comments: She does appear to have some decrease in her peripheral vision.  Seems to be  bilateral.  ED Treatments / Results  Labs (all labs ordered are listed, but only abnormal results are displayed) Labs Reviewed  BASIC METABOLIC PANEL - Abnormal; Notable for the following components:      Result Value   Glucose, Bld 144 (*)    Creatinine, Ser 1.64 (*)    Calcium 10.4 (*)    GFR calc non Af Amer 31 (*)    GFR calc Af Amer 36 (*)    All other components within normal limits  CBC - Abnormal; Notable for the following components:   Hemoglobin 11.5 (*)    All other components within normal limits  CBG MONITORING, ED - Abnormal; Notable for the following components:   Glucose-Capillary 142 (*)    All other components within normal limits  PROTIME-INR  APTT  DIFFERENTIAL  URINALYSIS, ROUTINE W REFLEX MICROSCOPIC  CBG MONITORING, ED    EKG EKG Interpretation  Date/Time:  Monday October 10 2018 13:32:28 EST Ventricular Rate:  98 PR Interval:    QRS Duration: 84 QT Interval:  319 QTC Calculation: 408 R Axis:   41 Text Interpretation:  Sinus rhythm Ventricular premature complex increased rate from prior similar pattern to prior 1/20 Confirmed by Aletta Edouard [phone]) on 10/10/2018 2:06:47 PM   Radiology Ct Head Wo Contrast  Result Date: 10/10/2018 CLINICAL DATA:  Dizziness, syncopal episode EXAM: CT HEAD WITHOUT CONTRAST TECHNIQUE: Contiguous axial images were obtained from the base of the skull through the vertex without intravenous contrast. COMPARISON:  09/22/2018 FINDINGS: Brain: No evidence of acute infarction, hemorrhage, hydrocephalus, extra-axial collection or mass lesion/mass effect. Periventricular white matter hypodensity. Vascular: No hyperdense vessel or unexpected calcification. Skull: Normal. Negative for fracture or focal lesion. Sinuses/Orbits: No acute finding. Other: None. IMPRESSION: No acute intracranial pathology.  Small-vessel white matter disease. Electronically Signed   By: Eddie Candle M.D.   On: 10/10/2018 15:18     Procedures Procedures (including critical care time)  Medications Ordered in ED Medications  sodium chloride flush (NS) 0.9 % injection 3 mL (has no administration in time range)     Initial Impression / Assessment and Plan / ED Course  I have reviewed the triage vital signs and the nursing notes.  Pertinent labs & imaging results that were available during my care of the patient were reviewed by me and considered in my medical decision making (see chart for details).  Clinical Course as of Oct 10 1730  Mon Oct 10, 2018  1520 Patient with a new AKI.  Her hematocrit and creatinine are up both from discharge.  The fact that she had syncope while sitting and then the incontinence afterward is not typical of just hypovolemia.  Head CT did not show any acute findings.  Discussed with the hospitalist   [MB]    Clinical Course User Index [MB] Hayden Rasmussen, MD     Final Clinical Impressions(s) / ED Diagnoses   Final diagnoses:  Syncope and collapse  AKI (acute kidney injury) [facility])    ED Discharge Orders    None       Hayden Rasmussen, MD 10/10/18 1733

## 2018-10-10 NOTE — ED Notes (Signed)
Patient reports taking medications in the morning, last taken this morning.

## 2018-10-10 NOTE — ED Notes (Signed)
CBg 142

## 2018-10-10 NOTE — ED Notes (Signed)
Public affairs consultant ordered

## 2018-10-10 NOTE — ED Triage Notes (Addendum)
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 Pt then went to break room for lunch and had syncopal episode around 1230 for unknown amount of time. Pt was alert and oriented on EMS arrival. Pt had episode of vomiting and urinary and bowel incontinence. Pt denies pain. Pt had similar episode 2 weeks ago and was admitted. Pt denies current dizziness.

## 2018-10-10 NOTE — H&P (Signed)
History and Physical    Heather Ellis ZOX:[id number] DOB: 1945/11/26 DOA: 10/10/2018  PCP: Jani Gravel, MD  Patient coming from: home.   I have personally briefly reviewed patient's old medical records available.   Chief Complaint: passed out at work   HPI: Heather Ellis is a 73 y.o. female with medical history significant of rheumatoid arthritis on methotrexate, hypertension, asthma, history of syncope with recent hospitalization comes to the emergency room with syncopal episode at work.  According to the patient, she was admitted to the hospital 2 weeks ago with similar symptoms that happened at work, she did not seek attention but she went to work next day and came to the ER.  Patient had episode of syncope in the ER with vomiting and bladder incontinence.  No seizure.  She underwent CT head, EEG, 2D echocardiogram and they were all essentially normal and she was discharged home with stopping beta-blockers. She had 2 episodes of orthostatic dizziness at home but she never blacked out at home. Today, she was eating lunch on her table at work, she was watching TV, she was not standing up, she suddenly felt colors in her eyes and blacked out.  She does not remember what happened.  When she woke up with EMS hovering over her, she had choking sensation she coughed and had bowel incontinence.  She had a coworker who witnessed her just passing out without seizure-like activities. Patient complains of poor sleep last night, denies any nausea or diarrhea.  She stated she had normal food intake.  Denies any fever or chills. ED Course: Hemodynamically stable.  Creatinine elevated suggestive of AKI.  Head CT normal.  EKG normal.  Review of Systems: As per HPI otherwise 10 point review of systems negative.    Past Medical History:  Diagnosis Date  . Acid reflux   . Asthma   . Hypertension   . Osteoporosis     Past Surgical History:  Procedure Laterality Date  . ABDOMINAL HYSTERECTOMY      TOTAL ABDOMINAL HYSTERECTOMY   . COLONOSCOPY W/ BIOPSIES  2015  . OOPHORECTOMY     BSO     reports that she quit smoking about 3 years ago. She has never used smokeless tobacco. She reports that she does not drink alcohol or use drugs.  Allergies  Allergen Reactions  . Fosamax [Alendronate Sodium] Nausea And Vomiting    Family History  Problem Relation Age of Onset  . Cancer Mother        THROAT- SMOKER  . Diabetes Mother   . Diabetes Sister   . Heart disease Maternal Aunt   . Stroke Maternal Aunt   . Cancer Maternal Uncle        LUNG -   . Heart disease Maternal Uncle   . Stroke Maternal Uncle   . Ovarian cancer Maternal Grandmother      Prior to Admission medications   Medication Sig Start Date End Date Taking? Authorizing Provider  amLODipine (NORVASC) 5 MG tablet Take 1 tablet (5 mg total) by mouth daily. 09/23/18   Geradine Girt, DO  aspirin 81 MG tablet Take 81 mg by mouth daily.    [provider]  cholecalciferol (VITAMIN D) 1000 UNITS tablet Take 1,000 Units by mouth daily.    [provider]  diclofenac sodium (VOLTAREN) 1 % GEL Apply 2 g topically 4 (four) times daily. 08/16/18   Loura Halt A, NP  folic acid (FOLVITE) 1 MG tablet Take 1 mg  by mouth daily. 09/11/18   [provider]  lisinopril (PRINIVIL,ZESTRIL) 10 MG tablet Take 40 mg by mouth daily.     [provider]  methotrexate (RHEUMATREX) 2.5 MG tablet Take 10 mg by mouth once a week.  06/15/18   [provider]  ondansetron (ZOFRAN ODT) 8 MG disintegrating tablet Take 1 tablet (8 mg total) by mouth every 8 (eight) hours as needed for nausea. 11/25/17   Katy Apo, NP  senna-docusate (SENOKOT-S) 8.6-50 MG tablet Take 1 tablet by mouth at bedtime as needed for mild constipation. 09/23/18   Geradine Girt, DO    Physical Exam: Vitals:   10/10/18 1345 10/10/18 1400 10/10/18 1415 10/10/18 1445  BP: 119/69 118/70 119/66 116/83  Pulse: 86 85 88 84   Resp: (!) 21 19 15 15   Temp:      TempSrc:      SpO2: 100% 98% 98% 100%  Height:        Constitutional: NAD, calm, comfortable Vitals:   10/10/18 1345 10/10/18 1400 10/10/18 1415 10/10/18 1445  BP: 119/69 118/70 119/66 116/83  Pulse: 86 85 88 84  Resp: (!) 21 19 15 15   Temp:      TempSrc:      SpO2: 100% 98% 98% 100%  Height:       Eyes: PERRL, lids and conjunctivae normal ENMT: Mucous membranes are dry. Posterior pharynx clear of any exudate or lesions.Normal dentition.  Neck: normal, supple, no masses, no thyromegaly Respiratory: clear to auscultation bilaterally, no wheezing, no crackles. Normal respiratory effort. No accessory muscle use.  Cardiovascular: Regular rate and rhythm, no murmurs / rubs / gallops. No extremity edema. 2+ pedal pulses. No carotid bruits.  Abdomen: no tenderness, no masses palpated. No hepatosplenomegaly. Bowel sounds positive.  Musculoskeletal: no clubbing / cyanosis. No joint deformity upper and lower extremities. Good ROM, no contractures. Normal muscle tone.  Skin: no rashes, lesions, ulcers. No induration Neurologic: CN 2-12 grossly intact. Sensation intact, DTR normal. Strength 5/5 in all 4.  Psychiatric: Normal judgment and insight. Alert and oriented x 3. Normal mood.     Labs on Admission: I have personally reviewed following labs and imaging studies  CBC: Recent Labs  Lab 10/10/18 1349  WBC 9.1  NEUTROABS 5.8  HGB 11.5*  HCT 38.1  MCV 94.3  PLT 696   Basic Metabolic Panel: Recent Labs  Lab 10/10/18 1349  NA 140  K 3.8  CL 99  CO2 30  GLUCOSE 144*  BUN 21  CREATININE 1.64*  CALCIUM 10.4*   GFR: CrCl cannot be calculated (Unknown ideal weight.). Liver Function Tests: No results for input(s): AST, ALT, ALKPHOS, BILITOT, PROT, ALBUMIN in the last 168 hours. No results for input(s): LIPASE, AMYLASE in the last 168 hours. No results for input(s): AMMONIA in the last 168 hours. Coagulation Profile: Recent Labs  Lab  10/10/18 1349  INR 1.04   Cardiac Enzymes: No results for input(s): CKTOTAL, CKMB, CKMBINDEX, TROPONINI in the last 168 hours. BNP (last 3 results) No results for input(s): PROBNP in the last 8760 hours. HbA1C: No results for input(s): HGBA1C in the last 72 hours. CBG: Recent Labs  Lab 10/10/18 1344  GLUCAP 142*   Lipid Profile: No results for input(s): CHOL, HDL, LDLCALC, TRIG, CHOLHDL, LDLDIRECT in the last 72 hours. Thyroid Function Tests: No results for input(s): TSH, T4TOTAL, FREET4, T3FREE, THYROIDAB in the last 72 hours. Anemia Panel: No results for input(s): VITAMINB12, FOLATE, FERRITIN, TIBC, IRON, RETICCTPCT in the  last 72 hours. Urine analysis:    Component Value Date/Time   COLORURINE YELLOW 09/22/2018 1410   APPEARANCEUR HAZY (A) 09/22/2018 1410   LABSPEC 1.011 09/22/2018 1410   PHURINE 5.0 09/22/2018 1410   GLUCOSEU NEGATIVE 09/22/2018 1410   HGBUR SMALL (A) 09/22/2018 1410   BILIRUBINUR NEGATIVE 09/22/2018 1410   KETONESUR NEGATIVE 09/22/2018 1410   PROTEINUR NEGATIVE 09/22/2018 1410   UROBILINOGEN 0.2 03/19/2015 1232   NITRITE NEGATIVE 09/22/2018 1410   LEUKOCYTESUR NEGATIVE 09/22/2018 1410    Radiological Exams on Admission: Ct Head Wo Contrast  Result Date: 10/10/2018 CLINICAL DATA:  Dizziness, syncopal episode EXAM: CT HEAD WITHOUT CONTRAST TECHNIQUE: Contiguous axial images were obtained from the base of the skull through the vertex without intravenous contrast. COMPARISON:  09/22/2018 FINDINGS: Brain: No evidence of acute infarction, hemorrhage, hydrocephalus, extra-axial collection or mass lesion/mass effect. Periventricular white matter hypodensity. Vascular: No hyperdense vessel or unexpected calcification. Skull: Normal. Negative for fracture or focal lesion. Sinuses/Orbits: No acute finding. Other: None. IMPRESSION: No acute intracranial pathology.  Small-vessel white matter disease. Electronically Signed   By: Eddie Candle M.D.   On: 10/10/2018  15:18    EKG: Independently reviewed.  Normal sinus rhythm.  Some PVCs.  Assessment/Plan Principal Problem:   Syncope Active Problems:   Essential hypertension   GERD (gastroesophageal reflux disease)   Syncope and collapse   AKI (acute kidney injury) ([place])     1.  Syncope and collapse: Cause unknown.  Still suspect orthostatic dizziness and collapse as evidenced by AKI and hemoconcentration.  CT head normal.  Recent EEG and 2D echocardiogram was normal.  No evidence of seizure or postictal confusion. Observation telemetry.  Continue IV fluids.  Discontinue lisinopril and keep on amlodipine. Check orthostatic blood pressures frequently. If persistent, will refer to neurology.  2.  Hypertension: Stable.  Hold lisinopril that may exacerbate orthostasis.  Continue amlodipine.  3.  Acute kidney injury with clinical dehydration: With no obvious cause.  Will hydrate and monitor levels.  4.  GERD: On PPI.  Continue.  5.  Rheumatoid arthritis: On methotrexate.  She will take at home.   DVT prophylaxis: Lovenox. Code Status: Full code. Family Communication: Patient with decision-making capacity. Disposition Plan: Home. Consults called: None. Admission status: Observation.   Barb Merino MD [facility] Pager [phone]  If 7PM-7AM, please contact night-coverage [email or web address] Password [facility]  10/10/2018, 4:24 PM

## 2018-10-11 ENCOUNTER — Other Ambulatory Visit: Payer: Self-pay | Admitting: Cardiology

## 2018-10-11 DIAGNOSIS — R55 Syncope and collapse: Secondary | ICD-10-CM

## 2018-10-11 DIAGNOSIS — I10 Essential (primary) hypertension: Secondary | ICD-10-CM | POA: Diagnosis not present

## 2018-10-11 DIAGNOSIS — N179 Acute kidney failure, unspecified: Secondary | ICD-10-CM | POA: Diagnosis not present

## 2018-10-11 DIAGNOSIS — K219 Gastro-esophageal reflux disease without esophagitis: Secondary | ICD-10-CM | POA: Diagnosis not present

## 2018-10-11 LAB — CBC
HCT: 31.5 % — ABNORMAL LOW (ref 36.0–46.0)
Hemoglobin: 10.2 g/dL — ABNORMAL LOW (ref 12.0–15.0)
MCH: 30 pg (ref 26.0–34.0)
MCHC: 32.4 g/dL (ref 30.0–36.0)
MCV: 92.6 fL (ref 80.0–100.0)
Platelets: 219 10*3/uL (ref 150–400)
RBC: 3.4 MIL/uL — ABNORMAL LOW (ref 3.87–5.11)
RDW: 14.9 % (ref 11.5–15.5)
WBC: 7.8 10*3/uL (ref 4.0–10.5)
nRBC: 0 % (ref 0.0–0.2)

## 2018-10-11 LAB — BASIC METABOLIC PANEL
ANION GAP: 10 (ref 5–15)
BUN: 14 mg/dL (ref 8–23)
CO2: 26 mmol/L (ref 22–32)
Calcium: 9.1 mg/dL (ref 8.9–10.3)
Chloride: 106 mmol/L (ref 98–111)
Creatinine, Ser: 0.7 mg/dL (ref 0.44–1.00)
GFR calc Af Amer: >60 mL/min (ref >60)
GFR calc non Af Amer: >60 mL/min (ref >60)
Glucose, Bld: 98 mg/dL (ref 70–99)
Potassium: 4 mmol/L (ref 3.5–5.1)
Sodium: 142 mmol/L (ref 135–145)

## 2018-10-11 MED ORDER — ENSURE ENLIVE PO LIQD: 237.0000 mL | Freq: Two times a day (BID) | ORAL | 0 refills | Status: DC

## 2018-10-11 NOTE — Progress Notes (Signed)
Pt ambulated 176ft around the unit with no sign of distress or syncopal episodes. Pt in stable condition. Will continue to monitor.

## 2018-10-11 NOTE — Discharge Instructions (Signed)
No driving until you are cleared by your outpatient MD. Use caution when using heavy equipment or power tools. Avoid working on ladders or at heights. Take showers instead of baths. Ensure the water temperature is not too high on the home water heater. Do not go swimming alone. When caring for infants or small children, sit down when holding, feeding, or changing them to minimize risk of injury to the child in the event you pass out.

## 2018-10-11 NOTE — Discharge Summary (Signed)
PATIENT DETAILS Name: Heather Ellis Age: 73 y.o. Sex: female Date of Birth: 1945-11-27 MRN: [id number]. Admitting Physician: Barb Merino, MD PCP:Kim, Jeneen Rinks, MD  Admit Date: 10/10/2018 Discharge date: 10/11/2018  Recommendations for Outpatient Follow-up:  1. Follow up with PCP in 1-2 weeks 2. Please obtain BMP/CBC in one week 3. Please ensure follow-up with cardiology for outpatient event monitor.   Admitted From:  Home  Disposition: [place]: No  Equipment/Devices: None  Discharge Condition: Stable  CODE STATUS: FULL CODE  Diet recommendation:  Heart Healthy   Brief Summary: See H&P, Labs, Consult and Test reports for all details in brief, patient is a 73 year old female with recent hospitalization for evaluation of syncope (underwent EEG-neg seizure, TTE with preserved EF)-presented to the hospital for a syncopal episode.  Per patient, over the past few days she has had episodes of dizzy spells especially when she stands up.  She was admitted to the hospitalist service for further evaluation and treatment.  Brief Hospital Course: Syncope: Given prior history of dizziness upon standing-high suspicion for orthostatic mechanism.  She also had mild AKI on presentation.  Unfortunately orthostatic vital signs were not checked on admission-they were checked this morning after she was given IV fluids and are negative.  She was ambulated around the unit by nursing staff-she did not complain of shortness of breath or chest pain.  She did not have any dizzy spells as well.  Telemetry was unremarkable-without any arrhythmias.  Recent echocardiogram done this past admission showed preserved EF.  EEG done during the most recent admission was without any seizures.  From H&P-no seizure-like activity was described by her coworkers who witnessed the syncopal episode.  She sustained a syncopal episode while sitting down, advised about driving restrictions (see discharge  instructions) until her work-up is complete.  Furthermore, since orthostatic mechanism is the suspected etiology-have advised her to use thigh-high TED hose, and about orthostatic precautions especially when sitting/standing up from a recumbent position.  This MD has spoken with cardiology team Gay Filler) who will arrange for a outpatient event monitor be placed.  Patient aware that she will get a call from the cardiologist office.  AKI: Suspect hemodynamically monitored-resolved with hydration.  No longer on lisinopril.  Hypertension: Continue amlodipine-continue to hold lisinopril.  If indeed she does have orthostatic hypotension-probably better to allow some mild permissive hypertension to allow room for the blood pressure to drop.   GERD: Continue PPI.  Rheumatoid arthritis: Resume usual medications.  Procedures/Studies: None  Discharge Diagnoses:  Principal Problem:   Syncope Active Problems:   Essential hypertension   GERD (gastroesophageal reflux disease)   Syncope and collapse   AKI (acute kidney injury) [facility])   Discharge Instructions:  Activity:  As tolerated with Full fall precautions use walker/cane & assistance as needed   Discharge Instructions    Call MD for:  persistant dizziness or light-headedness   Complete by:  As directed    Diet - low sodium heart healthy   Complete by:  As directed    Discharge instructions   Complete by:  As directed     No driving until you are cleared by your outpatient MD. Use caution when using heavy equipment or power tools. Avoid working on ladders or at heights. Take showers instead of baths. Ensure the water temperature is not too high on the home water heater. Do not go swimming alone. When caring for infants or small children, sit down when holding, feeding, or changing  them to minimize risk of injury to the child in the event you pass out.   Discharge instructions   Complete by:  As directed    Follow with Primary MD   Jani Gravel, MD in 1 week  Cardiologist's office (heart doctors office) will call you in a few days to place a cardiac monitor.  No driving until you are cleared by your outpatient MD. Use caution when using heavy equipment or power tools. Avoid working on ladders or at heights. Take showers instead of baths. Ensure the water temperature is not too high on the home water heater. Do not go swimming alone. When caring for infants or small children, sit down when holding, feeding, or changing them to minimize risk of injury to the child in the event you pass out.  Please get a complete blood count and chemistry panel checked by your Primary MD at your next visit, and again as instructed by your Primary MD.  Get Medicines reviewed and adjusted: Please take all your medications with you for your next visit with your Primary MD  Laboratory/radiological data: Please request your Primary MD to go over all hospital tests and procedure/radiological results at the follow up, please ask your Primary MD to get all Hospital records sent to his/her office.  In some cases, they will be blood work, cultures and biopsy results pending at the time of your discharge. Please request that your primary care M.D. follows up on these results.  Also Note the following: If you experience worsening of your admission symptoms, develop shortness of breath, life threatening emergency, suicidal or homicidal thoughts you must seek medical attention immediately by calling 911 or calling your MD immediately  if symptoms less severe.  You must read complete instructions/literature along with all the possible adverse reactions/side effects for all the Medicines you take and that have been prescribed to you. Take any new Medicines after you have completely understood and accpet all the possible adverse reactions/side effects.   Do not drive when taking Pain medications or sleeping medications (Benzodaizepines)  Do not take more  than prescribed Pain, Sleep and Anxiety Medications. It is not advisable to combine anxiety,sleep and pain medications without talking with your primary care practitioner  Special Instructions: If you have smoked or chewed Tobacco  in the last 2 yrs please stop smoking, stop any regular Alcohol  and or any Recreational drug use.  Wear Seat belts while driving.  Please note: You were cared for by a hospitalist during your hospital stay. Once you are discharged, your primary care physician will handle any further medical issues. Please note that NO REFILLS for any discharge medications will be authorized once you are discharged, as it is imperative that you return to your primary care physician (or establish a relationship with a primary care physician if you do not have one) for your post hospital discharge needs so that they can reassess your need for medications and monitor your lab values.   Increase activity slowly   Complete by:  As directed      Allergies as of 10/11/2018      Reactions   Fosamax [alendronate Sodium] Nausea And Vomiting      Medication List    STOP taking these medications   diclofenac sodium 1 % Gel Commonly known as:  VOLTAREN     TAKE these medications   amLODipine 5 MG tablet Commonly known as:  NORVASC Take 1 tablet (5 mg total) by mouth daily.  aspirin 81 MG tablet Take 81 mg by mouth daily.   cholecalciferol 1000 units tablet Commonly known as:  VITAMIN D Take 1,000 Units by mouth daily.   feeding supplement (ENSURE ENLIVE) Liqd Take 237 mLs by mouth 2 (two) times daily between meals.   folic acid 1 MG tablet Commonly known as:  FOLVITE Take 1 mg by mouth daily.   predniSONE 1 MG tablet Commonly known as:  DELTASONE Take 1 mg by mouth See admin instructions. Take 9mg  daily for one month, then 8mg  daily for 1 month, then 7mg  daily for one month.      Follow-up Information    Jani Gravel, MD. Schedule an appointment as soon as possible for a  visit in 1 week(s).   Specialty:  Internal Medicine Contact information: [address] [place] [address] [phone]        [place] [facility] Follow up.   Specialty:  Cardiology Why:  office will call you for a appointment Contact information: [address] [place] [address] [phone]         Allergies  Allergen Reactions  . Fosamax [Alendronate Sodium] Nausea And Vomiting    Consultations:   None  Other Procedures/Studies: Ct Head Wo Contrast  Result Date: 10/10/2018 CLINICAL DATA:  Dizziness, syncopal episode EXAM: CT HEAD WITHOUT CONTRAST TECHNIQUE: Contiguous axial images were obtained from the base of the skull through the vertex without intravenous contrast. COMPARISON:  09/22/2018 FINDINGS: Brain: No evidence of acute infarction, hemorrhage, hydrocephalus, extra-axial collection or mass lesion/mass effect. Periventricular white matter hypodensity. Vascular: No hyperdense vessel or unexpected calcification. Skull: Normal. Negative for fracture or focal lesion. Sinuses/Orbits: No acute finding. Other: None. IMPRESSION: No acute intracranial pathology.  Small-vessel white matter disease. Electronically Signed   By: Eddie Candle M.D.   On: 10/10/2018 15:18   Ct Head Wo Contrast  Result Date: 09/22/2018 CLINICAL DATA:  73 y/o  F; episode of unresponsiveness. EXAM: CT HEAD WITHOUT CONTRAST TECHNIQUE: Contiguous axial images were obtained from the base of the skull through the vertex without intravenous contrast. COMPARISON:  None. FINDINGS: Brain: No evidence of acute infarction, hemorrhage, hydrocephalus, extra-axial collection or mass lesion/mass effect. Nonspecific white matter hypodensities are compatible with chronic microvascular ischemic changes and there is volume loss of the brain. Small chronic lacunar infarcts are present within the thalami and the right posterior corona radiata. Vascular: No  hyperdense vessel or unexpected calcification. Skull: Normal. Negative for fracture or focal lesion. Sinuses/Orbits: No acute finding. Other: None. IMPRESSION: 1. No acute intracranial abnormality identified. 2. Chronic microvascular ischemic changes and volume loss of the brain. Small chronic lacunar infarcts within the thalami and right corona radiata. Electronically Signed   By: Kristine Garbe M.D.   On: 09/22/2018 13:53   Dg Foot 2 Views Left  Result Date: 10/10/2018 CLINICAL DATA:  Foot pain EXAM: LEFT FOOT - 2 VIEW COMPARISON:  None. FINDINGS: No fracture or malalignment. Vascular calcifications. Degenerative change at the first MTP joint. Small plantar calcaneal spur. IMPRESSION: 1. No acute osseous abnormality. 2. Degenerative changes at the first MTP joint and DIP joints. 3. Small plantar calcaneal spur Electronically Signed   By: Donavan Foil M.D.   On: 10/10/2018 22:36     TODAY-DAY OF DISCHARGE:  Subjective:   Heather Ellis today has no headache,no chest abdominal pain,no new weakness tingling or numbness, feels much better wants to go home today.  Objective:   Blood pressure (!) 149/92,  pulse 94, temperature 98.9 F (37.2 C), temperature source Oral, resp. rate 18, height 5\' 3"  (1.6 m), weight 65 kg, SpO2 100 %.  Intake/Output Summary (Last 24 hours) at 10/11/2018 1154 Last data filed at 10/11/2018 0900 Gross per 24 hour  Intake 1101.73 ml  Output 300 ml  Net 801.73 ml   Filed Weights   10/10/18 1858  Weight: 65 kg    Exam: Awake Alert, Oriented *3, No new F.N deficits, Normal affect [place].AT,PERRAL Supple Neck,No JVD, No cervical lymphadenopathy appriciated.  Symmetrical Chest wall movement, Good air movement bilaterally, CTAB RRR,No Gallops,Rubs or new Murmurs, No Parasternal Heave +ve B.Sounds, Abd Soft, Non tender, No organomegaly appriciated, No rebound -guarding or rigidity. No Cyanosis, Clubbing or edema, No new Rash or bruise   PERTINENT  RADIOLOGIC STUDIES: Ct Head Wo Contrast  Result Date: 10/10/2018 CLINICAL DATA:  Dizziness, syncopal episode EXAM: CT HEAD WITHOUT CONTRAST TECHNIQUE: Contiguous axial images were obtained from the base of the skull through the vertex without intravenous contrast. COMPARISON:  09/22/2018 FINDINGS: Brain: No evidence of acute infarction, hemorrhage, hydrocephalus, extra-axial collection or mass lesion/mass effect. Periventricular white matter hypodensity. Vascular: No hyperdense vessel or unexpected calcification. Skull: Normal. Negative for fracture or focal lesion. Sinuses/Orbits: No acute finding. Other: None. IMPRESSION: No acute intracranial pathology.  Small-vessel white matter disease. Electronically Signed   By: Eddie Candle M.D.   On: 10/10/2018 15:18   Ct Head Wo Contrast  Result Date: 09/22/2018 CLINICAL DATA:  73 y/o  F; episode of unresponsiveness. EXAM: CT HEAD WITHOUT CONTRAST TECHNIQUE: Contiguous axial images were obtained from the base of the skull through the vertex without intravenous contrast. COMPARISON:  None. FINDINGS: Brain: No evidence of acute infarction, hemorrhage, hydrocephalus, extra-axial collection or mass lesion/mass effect. Nonspecific white matter hypodensities are compatible with chronic microvascular ischemic changes and there is volume loss of the brain. Small chronic lacunar infarcts are present within the thalami and the right posterior corona radiata. Vascular: No hyperdense vessel or unexpected calcification. Skull: Normal. Negative for fracture or focal lesion. Sinuses/Orbits: No acute finding. Other: None. IMPRESSION: 1. No acute intracranial abnormality identified. 2. Chronic microvascular ischemic changes and volume loss of the brain. Small chronic lacunar infarcts within the thalami and right corona radiata. Electronically Signed   By: Kristine Garbe M.D.   On: 09/22/2018 13:53   Dg Foot 2 Views Left  Result Date: 10/10/2018 CLINICAL DATA:  Foot  pain EXAM: LEFT FOOT - 2 VIEW COMPARISON:  None. FINDINGS: No fracture or malalignment. Vascular calcifications. Degenerative change at the first MTP joint. Small plantar calcaneal spur. IMPRESSION: 1. No acute osseous abnormality. 2. Degenerative changes at the first MTP joint and DIP joints. 3. Small plantar calcaneal spur Electronically Signed   By: Donavan Foil M.D.   On: 10/10/2018 22:36     PERTINENT LAB RESULTS: CBC: Recent Labs    10/10/18 1349 10/11/18 0347  WBC 9.1 7.8  HGB 11.5* 10.2*  HCT 38.1 31.5*  PLT 256 219   CMET CMP     Component Value Date/Time   NA 142 10/11/2018 0347   K 4.0 10/11/2018 0347   CL 106 10/11/2018 0347   CO2 26 10/11/2018 0347   GLUCOSE 98 10/11/2018 0347   BUN 14 10/11/2018 0347   CREATININE 0.70 10/11/2018 0347   CALCIUM 9.1 10/11/2018 0347   PROT 6.0 (L) 09/23/2018 0528   ALBUMIN 3.2 (L) 09/23/2018 0528   AST 15 09/23/2018 0528   ALT 13 09/23/2018 0528  ALKPHOS 47 09/23/2018 0528   BILITOT 1.3 (H) 09/23/2018 0528   GFRNONAA >60 10/11/2018 0347   GFRAA >60 10/11/2018 0347    GFR Estimated Creatinine Clearance: 56.8 mL/min (by C-G formula based on SCr of 0.7 mg/dL). No results for input(s): LIPASE, AMYLASE in the last 72 hours. No results for input(s): CKTOTAL, CKMB, CKMBINDEX, TROPONINI in the last 72 hours. Invalid input(s): POCBNP No results for input(s): DDIMER in the last 72 hours. No results for input(s): HGBA1C in the last 72 hours. No results for input(s): CHOL, HDL, LDLCALC, TRIG, CHOLHDL, LDLDIRECT in the last 72 hours. No results for input(s): TSH, T4TOTAL, T3FREE, THYROIDAB in the last 72 hours.  Invalid input(s): FREET3 No results for input(s): VITAMINB12, FOLATE, FERRITIN, TIBC, IRON, RETICCTPCT in the last 72 hours. Coags: Recent Labs    10/10/18 1349  INR 1.04   Microbiology: No results found for this or any previous visit (from the past 240 hour(s)).  FURTHER DISCHARGE INSTRUCTIONS:  Get Medicines  reviewed and adjusted: Please take all your medications with you for your next visit with your Primary MD  Laboratory/radiological data: Please request your Primary MD to go over all hospital tests and procedure/radiological results at the follow up, please ask your Primary MD to get all Hospital records sent to his/her office.  In some cases, they will be blood work, cultures and biopsy results pending at the time of your discharge. Please request that your primary care M.D. goes through all the records of your hospital data and follows up on these results.  Also Note the following: If you experience worsening of your admission symptoms, develop shortness of breath, life threatening emergency, suicidal or homicidal thoughts you must seek medical attention immediately by calling 911 or calling your MD immediately  if symptoms less severe.  You must read complete instructions/literature along with all the possible adverse reactions/side effects for all the Medicines you take and that have been prescribed to you. Take any new Medicines after you have completely understood and accpet all the possible adverse reactions/side effects.   Do not drive when taking Pain medications or sleeping medications (Benzodaizepines)  Do not take more than prescribed Pain, Sleep and Anxiety Medications. It is not advisable to combine anxiety,sleep and pain medications without talking with your primary care practitioner  Special Instructions: If you have smoked or chewed Tobacco  in the last 2 yrs please stop smoking, stop any regular Alcohol  and or any Recreational drug use.  Wear Seat belts while driving.  Please note: You were cared for by a hospitalist during your hospital stay. Once you are discharged, your primary care physician will handle any further medical issues. Please note that NO REFILLS for any discharge medications will be authorized once you are discharged, as it is imperative that you return to  your primary care physician (or establish a relationship with a primary care physician if you do not have one) for your post hospital discharge needs so that they can reassess your need for medications and monitor your lab values.  Total Time spent coordinating discharge including counseling, education and face to face time equals 25  minutes.  SignedOren Binet 10/11/2018 11:54 AM

## 2018-10-11 NOTE — Progress Notes (Signed)
Pt given discharge instructions, prescriptions, and care notes. Pt verbalized understanding AEB no further questions or concerns at this time. IV was discontinued, no redness, pain, or swelling noted at this time. Telemetry discontinued and Centralized Telemetry was notified. Pt left the floor via wheelchair with staff in stable condition. Skin dry and intact. 

## 2018-10-21 ENCOUNTER — Ambulatory Visit (INDEPENDENT_AMBULATORY_CARE_PROVIDER_SITE_OTHER): Payer: Medicare Other

## 2018-10-21 DIAGNOSIS — R55 Syncope and collapse: Secondary | ICD-10-CM

## 2018-10-24 DIAGNOSIS — R55 Syncope and collapse: Secondary | ICD-10-CM | POA: Diagnosis not present

## 2018-10-25 ENCOUNTER — Encounter: Payer: Self-pay | Admitting: *Deleted

## 2018-10-26 ENCOUNTER — Encounter: Payer: Self-pay | Admitting: Neurology

## 2018-10-26 ENCOUNTER — Telehealth: Payer: Self-pay | Admitting: Neurology

## 2018-10-26 ENCOUNTER — Telehealth: Payer: Self-pay | Admitting: *Deleted

## 2018-10-26 ENCOUNTER — Ambulatory Visit: Payer: Medicare Other | Admitting: Neurology

## 2018-10-26 VITALS — BP 135/86 | HR 86 | Ht 63.0 in | Wt 145.0 lb

## 2018-10-26 DIAGNOSIS — M79643 Pain in unspecified hand: Secondary | ICD-10-CM | POA: Diagnosis not present

## 2018-10-26 DIAGNOSIS — M810 Age-related osteoporosis without current pathological fracture: Secondary | ICD-10-CM | POA: Diagnosis not present

## 2018-10-26 DIAGNOSIS — G9340 Encephalopathy, unspecified: Secondary | ICD-10-CM | POA: Diagnosis not present

## 2018-10-26 DIAGNOSIS — R569 Unspecified convulsions: Secondary | ICD-10-CM | POA: Diagnosis not present

## 2018-10-26 DIAGNOSIS — R419 Unspecified symptoms and signs involving cognitive functions and awareness: Secondary | ICD-10-CM | POA: Diagnosis not present

## 2018-10-26 DIAGNOSIS — Z7952 Long term (current) use of systemic steroids: Secondary | ICD-10-CM | POA: Diagnosis not present

## 2018-10-26 DIAGNOSIS — R55 Syncope and collapse: Secondary | ICD-10-CM | POA: Diagnosis not present

## 2018-10-26 DIAGNOSIS — M353 Polymyalgia rheumatica: Secondary | ICD-10-CM | POA: Diagnosis not present

## 2018-10-26 DIAGNOSIS — M1990 Unspecified osteoarthritis, unspecified site: Secondary | ICD-10-CM | POA: Diagnosis not present

## 2018-10-26 NOTE — Telephone Encounter (Signed)
[employer] medicare order sent to [facility]. No auth they will reach out to the pt to schedule.  °

## 2018-10-26 NOTE — Telephone Encounter (Signed)
[facility] referral form completed for 72 hour AMB EEG. Ready for MD signature.

## 2018-10-26 NOTE — Patient Instructions (Signed)
MRI of the brain seizure protocol Ambulatory EEG   Seizure, Adult A seizure is a sudden burst of abnormal electrical activity in the brain. The abnormal activity temporarily interrupts normal brain function, causing a person to experience any of the following:  Involuntary movements.  Changes in awareness or consciousness.  Uncontrollable shaking (convulsions). Seizures usually last from 30 seconds to 2 minutes. They usually do not cause permanent brain damage unless they are prolonged. What are the causes? This condition may be caused by:  Fever.  Low blood sugar.  Medicine.  Illness.  Brain injury.  Brain tumor.  Stroke.  A condition that is passed from parent to child (genetic).  Addiction to a substance (substanceuse disorder) or suddenly stopping the use of a substance (withdrawal). Some people who have a seizure never have another one. People who have repeated seizures have a condition called epilepsy. What are the signs or symptoms? Symptoms of this condition vary greatly from person to person. They include:  Convulsions.  Stiffening of the body.  Involuntary movements of the arms or legs.  Loss of consciousness.  Breathing problems.  Falling suddenly.  Confusion.  Head nodding.  Eye blinking or fluttering.  Lip smacking or tongue biting.  Drooling.  Rapid eye movements.  Grunting.  Loss of bladder control and bowel control.  Staring.  Unresponsiveness. Some people have symptoms right before a seizure happens (aura) and right after a seizure happens.  Symptoms that may occur before a seizure include: ? Fear or anxiety. ? Nausea. ? Feeling like the room is spinning (vertigo). ? A feeling of having seen or heard something before (dj vu). ? Odd tastes or smells. ? Changes in vision, such as seeing flashing lights or spots.  Symptoms that may occur after a seizure include: ? Confusion. ? Sleepiness. ? Headache. ? Weakness on one  side of the body. How is this diagnosed? This condition may be diagnosed based on medical history and physical exam. You may also have other tests, including:  Blood tests.  Electroencephalogram, EEG.  CT scan.  MRI.  Spinal tap (lumbar puncture). How is this treated? Most seizures will stop on their own in under 5 minutes and no treatment is needed. Seizures lasting longer than 5 minutes will usually need treatment. Treatment includes:  Medicines given through IV.  Avoiding known triggers, such as medicines that you take for another condition.  Medicines to treat epilepsy (antiepileptics), if epilepsy caused your seizures.  Surgery to stop seizures, if you have epilepsy that does not respond to medicines. Follow these instructions at home: Medicines  Take over-the-counter and prescription medicines only as told by your health care provider.  Avoid any substances that may prevent your medicine from working properly, such as alcohol. Activity  Do not drive, swim, or do any other activities that would be dangerous if you had another seizure. Wait until your health care provider says it is safe to do them.  If you live in the [place] check with your local DMV (department of motor vehicles) to find out about local driving laws. Each state has specific rules about when you can legally return to driving.  Get enough rest. Lack of sleep can make seizures more likely to occur. Educating others Teach friends and family what to do if you have a seizure. They should:  Lay you on the ground to prevent a fall.  Cushion your head and body.  Loosen any tight clothing around your neck.  Turn you on your side.  If vomiting occurs, this helps keep your airway clear.  Not hold you down. Holding you down will not stop the seizure.  Not put anything into your mouth.  Know whether or not you need emergency care.  Stay with you until you recover.  General instructions  Contact your  health care provider each time you have a seizure.  Avoid anything that has ever triggered a seizure for you.  Keep a seizure diary. Record what you remember about each seizure, especially anything that might have triggered the seizure.  Keep all follow-up visits as told by your health care provider. This is important. Contact a health care provider if:  You have another seizure.  You have seizures more often.  Your seizure symptoms change.  You continue to have seizures with treatment.  You have symptoms of an infection or illness. These might increase your risk of having a seizure. Get help right away if:  You have a seizure that: ? Lasts longer than 5 minutes. ? Is different than previous seizures. ? Leaves you unable to speak or use a part of your body. ? Makes it harder to breathe.  You have: ? A seizure after a head injury. ? Multiple seizures in a row. ? Confusion or a severe headache right after a seizure.  You are having seizures more often.  You do not wake up immediately after a seizure.  You injure yourself during a seizure. These symptoms may represent a serious problem that is an emergency. Do not wait to see if the symptoms will go away. Get medical help right away. Call your local emergency services (911 in the [place] Do not drive yourself to the hospital. Summary  Seizures are caused by abnormal electrical activity in the brain. The activity disrupts normal brain function, leading to a change in consciousness, abnormal movements, or convulsions.  There are many causes of seizures including illnesses, medicines, genetic conditions, head injuries, strokes, tumors, substance abuse, or substance withdrawal.  Most seizures will stop on their own in under 5 minutes. Seizures lasting longer than 5 minutes are a medical emergency and require immediate treatment.  There are many medicines that are used to treat seizures. Take over-the-counter and prescription  medicines only as told by your health care provider. This information is not intended to replace advice given to you by your health care provider. Make sure you discuss any questions you have with your health care provider. Document Released: 08/14/2000 Document Revised: 09/23/2017 Document Reviewed: 09/23/2017 Elsevier Interactive Patient Education  2019 [employer].

## 2018-10-26 NOTE — Progress Notes (Signed)
[facility]    Provider:  Dr Jaynee Eagles Referring Provider: Jani Gravel, MD Primary Care Provider:  Jani Gravel, MD  CC:  syncope  HPI:  Heather Ellis is a 73 y.o. female here as requested by provider Jani Gravel, MD for syncope, rule out seizure.  Past medical history asthma, hypertension, osteoporosis, PMR, obesitymultiple syncopal events since January of this year.  Patient has had multiple syncopal episodes recently and been admitted twice into the hospital cardiac work-up was unremarkable she was dehydrated with acute kidney injury with suspected vasovagal or orthostatic hypotension.  EEG was negative. She is here alone and she is wearing a heart monitor for one month. Patient doesn't remember any o fthe episodes. She would "come to" and she would have throw up, she doesn't remember any urine on her but she did once make a bowel movement. She does not know how long she is unconscious. She does not remember any prodrome to the episode, the third time her stomach was kind of "noisy" and she only ate peanut butter crackers and then she worked and then she noticed paramedics around her. She was able to talk to paramedics and remember the conversation at the scene. No Fhx or personal hx of seizure. She has never had syncopal events until recently. Started the end of December and 3 times in total on the job and once in the ED, not at home. Every time it happened she was in a sitting position. Unknown triggers. No focal weakness. At one point she was bradycardic in the ED and er lips turned blue. No CP, SOB or other associated symptoms. No other focal neurologic deficits, associated symptoms, inciting events or modifiable factors.  Reviewed notes, labs and imaging from outside physicians, which showed:  Reviewed notes from epic.  Patient's been in the emergency room multiple times in 2019. In fact more than 3 or 4 times since the end of December 2019.  On August 29, 2018 she went in  for leg pain.  She works in housekeeping at [employer].  But in January on the 23rd she went to the emergency room after a syncopal episode at work.  She denied any chest pain or shortness of breath.  She was evaluated by EMS at the time and refused transport.  Patient returned to work and was brought in by a coworker to the emergency room who stated that staff was very concerned and wanted her to be evaluated and that she did not look good.  In the emergency room patient was unresponsive and took 4 people to lift the patient onto the bed, she became incontinent, but lips dusky, patient started to wake up when she was getting undressed.  During this episode it was right after she had blood drawn, resolved on its own, no seizure-like activity, possibly bradycardic during the event and had sinus bradycardia but no sign of heart block on EKG.  Given multiple syncopal events concern for possible cardiac process and she was admitted.  Prior to the syncopal episode she felt pale and vomited and had bladder incontinence.  The hospital suspected vasovagal due to bradycardia with hypotension in the ED and acute kidney injury.She says her mother used to "black out" but unknown diagnosis. Her brother also "blacks out" he had a few of them. But no diagnosis of seizure.   Reviewed EEG on September 23, 2018 which was a normal EEG in the waking and sleep state no seizure or seizure predisposition.  Patient was  admitted again to the emergency room on October 10, 2018 after syncopal event at work.  She was sitting in a chair in the break room having lunch and then the paramedics were there.  She was incontinent of stool and bowel.  She underwent negative EEG, TTE with preserved ejection fraction.  Dizzy when she stands up suspicious for orthostatic mechanism.  Unfortunately orthostatic vital signs were not checked on admission.  She felt better after getting IV fluids.  She was recommended to use thigh-high TED hose,  and discussed orthostatic precautions.  She will be seeing cardiology outpatient for an outpatient event monitor.  Ct showed No acute intracranial abnormalities including mass lesion or mass effect, hydrocephalus, extra-axial fluid collection, midline shift, hemorrhage, or acute infarction, large ischemic events (personally reviewed images) 09/22/2018 and 10/10/2018.     Review of Systems: Patient complains of symptoms per HPI as well as the following symptoms: blurred vision. Pertinent negatives and positives per HPI. All others negative.   Social History   Socioeconomic History  . Marital status: Divorced    Spouse name: Not on file  . Number of children: Not on file  . Years of education: Not on file  . Highest education level: Not on file  Occupational History  . Not on file  Social Needs  . Financial resource strain: Not on file  . Food insecurity:    Worry: Not on file    Inability: Not on file  . Transportation needs:    Medical: Not on file    Non-medical: Not on file  Tobacco Use  . Smoking status: Former Smoker    Last attempt to quit: 10/19/2014    Years since quitting: 4.0  . Smokeless tobacco: Never Used  Substance and Sexual Activity  . Alcohol use: Yes    Alcohol/week: 0.0 standard drinks    Comment: occasional  . Drug use: No  . Sexual activity: Never    Comment: 1st intercourse 38 yo-1 partner  Lifestyle  . Physical activity:    Days per week: Not on file    Minutes per session: Not on file  . Stress: Not on file  Relationships  . Social connections:    Talks on phone: Not on file    Gets together: Not on file    Attends religious service: Not on file    Active member of club or organization: Not on file    Attends meetings of clubs or organizations: Not on file    Relationship status: Not on file  . Intimate partner violence:    Fear of current or ex partner: Not on file    Emotionally abused: Not on file    Physically abused: Not on file     Forced sexual activity: Not on file  Other Topics Concern  . Not on file  Social History Narrative  . Not on file    Family History  Problem Relation Age of Onset  . Cancer Mother        THROAT- SMOKER  . Diabetes Mother   . Seizures Mother   . Diabetes Sister   . Heart disease Maternal Aunt   . Stroke Maternal Aunt   . Cancer Maternal Uncle        LUNG -   . Heart disease Maternal Uncle   . Stroke Maternal Uncle   . Ovarian cancer Maternal Grandmother   . Heart attack Father   . Heart disease Father     Past Medical History:  Diagnosis Date  . Acid reflux   . Asthma   . COPD (chronic obstructive pulmonary disease) ([place])   . H/O polymyalgia rheumatica   . Hypertension   . Obesity   . Osteoporosis   . Vitamin D deficiency     Patient Active Problem List   Diagnosis Date Noted  . Essential hypertension 10/10/2018  . GERD (gastroesophageal reflux disease) 10/10/2018  . Syncope and collapse 10/10/2018  . AKI (acute kidney injury) ([place]) 10/10/2018  . Syncope 09/22/2018  . Post-menopause on HRT (hormone replacement therapy) 04/27/2016  . Osteoporosis, unspecified 12/06/2013  . Vaginal atrophy 12/06/2013    Past Surgical History:  Procedure Laterality Date  . ABDOMINAL HYSTERECTOMY     TOTAL ABDOMINAL HYSTERECTOMY   . COLONOSCOPY W/ BIOPSIES  2015  . ESOPHAGOGASTRODUODENOSCOPY    . OOPHORECTOMY     BSO    Current Outpatient Medications  Medication Sig Dispense Refill  . amLODipine (NORVASC) 5 MG tablet Take 1 tablet (5 mg total) by mouth daily. 30 tablet 0  . aspirin 81 MG tablet Take 81 mg by mouth daily.    . cholecalciferol (VITAMIN D) 1000 UNITS tablet Take 1,000 Units by mouth daily.    . cyclobenzaprine (FLEXERIL) 10 MG tablet Take 10 mg by mouth 3 (three) times daily as needed for muscle spasms.    . feeding supplement, ENSURE ENLIVE, (ENSURE ENLIVE) LIQD Take 237 mLs by mouth 2 (two) times daily between meals. 60 Bottle 0  . folic acid (FOLVITE) 1 MG  tablet Take 1 mg by mouth daily.    Marland Kitchen omeprazole (PRILOSEC) 20 MG capsule Take 20 mg by mouth daily.    . predniSONE (DELTASONE) 1 MG tablet Take 1 mg by mouth See admin instructions. Take 9mg  daily for one month, then 8mg  daily for 1 month, then 7mg  daily for one month.     No current facility-administered medications for this visit.     Allergies as of 10/26/2018 - Review Complete 10/26/2018  Allergen Reaction Noted  . Fosamax [alendronate sodium] Nausea And Vomiting 11/25/2017    Vitals: BP 135/86 (BP Location: Left Arm, Patient Position: Standing)   Pulse 86   Ht 5\' 3"  (1.6 m)   Wt 145 lb (65.8 kg)   BMI 25.69 kg/m  Last Weight:  Wt Readings from Last 1 Encounters:  10/26/18 145 lb (65.8 kg)   Last Height:   Ht Readings from Last 1 Encounters:  10/26/18 5\' 3"  (1.6 m)     Physical exam: Exam: Gen: NAD, conversant                    CV: RRR, no MRG. No Carotid Bruits. No peripheral edema, warm, nontender Eyes: Conjunctivae clear without exudates or hemorrhage  Neuro: Detailed Neurologic Exam  Speech:    Speech is normal; fluent and spontaneous with normal comprehension.  Cognition:    The patient is oriented to person, place, and time;     recent and remote memory intact;     language fluent;     normal attention, concentration,     fund of knowledge Cranial Nerves:    The pupils are equal, round, and reactive to light. Attempted fundoscopic exam could not visualize due to small pupils.  Visual fields are full to finger confrontation. Extraocular movements are intact. Trigeminal sensation is intact and the muscles of mastication are normal. The face is symmetric. The palate elevates in the midline. Hearing intact. Voice is normal. Shoulder shrug is normal.  The tongue has normal motion without fasciculations.   Coordination:    Normal finger to nose   Gait:    Normal native gait  Motor Observation:    No asymmetry, no atrophy, and no involuntary movements  noted. Tone:    Normal muscle tone.    Posture:    Posture is normal.     Strength: proximal UE weakness due to shoulder pain otherwise strength is V/V in the upper and lower limbs.      Sensation: intact to LT     Reflex Exam:  DTR's:    Deep tendon reflexes in the upper and lower extremities are brisk bilaterally.   Toes:    The toes are equivocal bilaterally.   Clonus:    Clonus is absent.    Assessment/Plan:  73 y.o. female here as requested by provider Jani Gravel, MD for syncope, rule out seizure.  Past medical history asthma, hypertension, osteoporosis, PMR, obesity, multiple syncopal events since Dec2019/January of this year.  Patient has had multiple syncopal episodes recently and been admitted twice into the hospital cardiac work-up was unremarkable she was dehydrated with acute kidney injury so suspected vasovagal or orthostatic hypotension.  EEG was negative.   MRI of the brain w/wo contrast seizure protocol Ambulatory EEG for 72 hours - she has a heart monitor will ask if she can be monitored with eeg at the same time.  Patient is unable to drive, operate heavy machinery, perform activities at heights or participate in water activities until 6 months seizure/syncope free. Discussed with patient this is [place] law.   Discussed seizure precautions  Call 911 for any alteration of awareness  Orders Placed This Encounter  Procedures  . MR BRAIN W WO CONTRAST  . Basic Metabolic Panel    Cc: Jani Gravel, MD,    Sarina Ill, MD  [facility] [address] [place], [place] [address]  Phone [phone] Fax [phone]

## 2018-10-26 NOTE — Telephone Encounter (Signed)
Referral signed & faxed to [facility] including insurance info, office note, eeg result and med list. Received a receipt of confirmation.

## 2018-10-27 LAB — BASIC METABOLIC PANEL
BUN/Creatinine Ratio: 28 (ref 12–28)
BUN: 17 mg/dL (ref 8–27)
CALCIUM: 10.1 mg/dL (ref 8.7–10.3)
CO2: 31 mmol/L — ABNORMAL HIGH (ref 20–29)
Chloride: 100 mmol/L (ref 96–106)
Creatinine, Ser: 0.61 mg/dL (ref 0.57–1.00)
GFR calc non Af Amer: 90 mL/min/{1.73_m2} (ref >59)
GFR, EST AFRICAN AMERICAN: 104 mL/min/{1.73_m2} (ref >59)
Glucose: 90 mg/dL (ref 65–99)
Potassium: 4.4 mmol/L (ref 3.5–5.2)
Sodium: 146 mmol/L — ABNORMAL HIGH (ref 134–144)

## 2018-10-27 NOTE — Telephone Encounter (Signed)
Received fax from [facility]. Referral has been received and is in process. Will receive another update once pt has been successfully scheduled.

## 2018-11-04 DIAGNOSIS — R569 Unspecified convulsions: Secondary | ICD-10-CM | POA: Diagnosis not present

## 2018-11-05 DIAGNOSIS — R569 Unspecified convulsions: Secondary | ICD-10-CM | POA: Diagnosis not present

## 2018-11-06 DIAGNOSIS — R569 Unspecified convulsions: Secondary | ICD-10-CM | POA: Diagnosis not present

## 2018-11-14 DIAGNOSIS — R1313 Dysphagia, pharyngeal phase: Secondary | ICD-10-CM | POA: Diagnosis not present

## 2018-11-17 ENCOUNTER — Telehealth: Payer: Self-pay | Admitting: *Deleted

## 2018-11-17 NOTE — Telephone Encounter (Signed)
Received 72 hour EEG results from [facility].   Physician conclusion/impression: This 72 hour awake and asleep video EEG was within normal limits. No focal, lateralized or epileptiform features were noted. There were 0 push button events during the entire recording.   Impression: A normal EEG in and of itself, does not rule out the clinical diagnosis of seizure disorder. Clinical correlation recommended.   Results were sent to medical records for scanning.

## 2018-11-18 NOTE — Telephone Encounter (Signed)
Heather Ellis, let patient know the 72-hour EEG was normal no seizures. Please fax a copy to Dr. Jeneen Rinks' office, her pcp thanks

## 2018-11-21 ENCOUNTER — Other Ambulatory Visit: Payer: Self-pay | Admitting: Otolaryngology

## 2018-11-21 DIAGNOSIS — R1313 Dysphagia, pharyngeal phase: Secondary | ICD-10-CM

## 2018-11-22 ENCOUNTER — Telehealth: Payer: Self-pay

## 2018-11-22 ENCOUNTER — Other Ambulatory Visit: Payer: Self-pay

## 2018-11-22 ENCOUNTER — Ambulatory Visit
Admission: RE | Admit: 2018-11-22 | Discharge: 2018-11-22 | Disposition: A | Discharge status: Home / Self Care | Payer: Medicare Other | Source: Ambulatory Visit | Attending: Neurology | Admitting: Neurology

## 2018-11-22 DIAGNOSIS — R55 Syncope and collapse: Secondary | ICD-10-CM

## 2018-11-22 DIAGNOSIS — G9340 Encephalopathy, unspecified: Secondary | ICD-10-CM

## 2018-11-22 DIAGNOSIS — R569 Unspecified convulsions: Secondary | ICD-10-CM | POA: Diagnosis not present

## 2018-11-22 DIAGNOSIS — R419 Unspecified symptoms and signs involving cognitive functions and awareness: Secondary | ICD-10-CM

## 2018-11-22 MED ORDER — GADOBENATE DIMEGLUMINE 529 MG/ML IV SOLN
13.0000 mL | Freq: Once | INTRAVENOUS | Status: AC | PRN
  Administered 2018-11-22: 13 mL via INTRAVENOUS

## 2018-11-22 NOTE — Telephone Encounter (Signed)
-----   Message from Cheryln Manly, NP sent at 11/22/2018 12:45 PM EDT ----- Please inform patient heart monitor did not show any Afib. Thx!

## 2018-11-22 NOTE — Progress Notes (Unsigned)
Called pt and informed her results showed no Afib. Pt verbalized understanding.

## 2018-11-23 ENCOUNTER — Telehealth: Payer: Self-pay | Admitting: *Deleted

## 2018-11-23 NOTE — Telephone Encounter (Signed)
-----   Message from Melvenia Beam, MD sent at 11/22/2018 10:46 AM EDT ----- No arrythmias. thanks

## 2018-11-23 NOTE — Telephone Encounter (Signed)
Spoke with pt and advised her heart monitor did not show any A-fib, no arrhythmias. Pt verbalized understanding and appreciation.

## 2018-11-29 ENCOUNTER — Telehealth: Payer: Self-pay | Admitting: *Deleted

## 2018-11-29 ENCOUNTER — Telehealth: Payer: Self-pay

## 2018-11-29 NOTE — Telephone Encounter (Signed)
I was able to speak with the patient she was abel to verify her PMH, Allergies and Medications. Patient's chart has been updated accordingly

## 2018-11-29 NOTE — Telephone Encounter (Signed)
Spoke to pt and relayed that her MRI brain per Dr. Jaynee Eagles did show:   several old small strokes consistent with uncontrolled blood pressure, cholesterol or other disorders that can damage blood vessels. They are so small she may not have known she had them. She would like to discuss with her over a phone visit or webex. . She can;t tell how old they are, and she is already on ASA and blood pressure medications so there may be nothing else to do but we should discuss. I relayed to pt and she would like to do telephone, since does not have laptop or smartphone.  Made appt to be available 12-01-18 at 0900.   She would be so.

## 2018-11-29 NOTE — Telephone Encounter (Signed)
-----   Message from Melvenia Beam, MD sent at 11/28/2018 11:59 AM EDT ----- Patient has several old small strokes consistent with uncontrolled blood pressure, cholesterol or other disorders that can damage blood vessels. They are so small she may not have known she had them. I'd like to discuss with her over a phone visit or webex. If she likes she can wait and come into the office to discuss. I can;t tell how old they are, and she is already on ASA and blood pressure medications so there may be nothing else to do but we should discuss. thanks

## 2018-12-01 ENCOUNTER — Ambulatory Visit (INDEPENDENT_AMBULATORY_CARE_PROVIDER_SITE_OTHER): Payer: Medicare Other | Admitting: Neurology

## 2018-12-01 ENCOUNTER — Other Ambulatory Visit: Payer: Self-pay

## 2018-12-01 DIAGNOSIS — R55 Syncope and collapse: Secondary | ICD-10-CM

## 2018-12-01 DIAGNOSIS — I639 Cerebral infarction, unspecified: Secondary | ICD-10-CM

## 2018-12-01 DIAGNOSIS — I679 Cerebrovascular disease, unspecified: Secondary | ICD-10-CM | POA: Diagnosis not present

## 2018-12-01 DIAGNOSIS — I6381 Other cerebral infarction due to occlusion or stenosis of small artery: Secondary | ICD-10-CM | POA: Diagnosis not present

## 2018-12-01 DIAGNOSIS — I10 Essential (primary) hypertension: Secondary | ICD-10-CM

## 2018-12-01 NOTE — Progress Notes (Signed)
[facility]    Provider:  Dr Jaynee Eagles Referring Provider: Jani Gravel, MD Primary Care Provider:  Jani Gravel, MD  CC:  Syncope  Interval history 12/01/2018:  Virtual Visit via Video Note  I connected with Gwenevere Abbot on 12/05/18 at  9:00 AM EDT by a video enabled telemedicine application and verified that I am speaking with the correct person using two identifiers. Patient is at home and physician is in the office.   I discussed the limitations of evaluation and management by telemedicine and the availability of in person appointments. The patient expressed understanding and agreed to proceed.  This is a very nice 73 year old patient with repeated episodes of syncope unlikely seizures.  Today were having a video encounter to review the findings.  Prior routine EEG was negative.  An ambulatory 72-hour EEG was performed and reviewed by myself which was negative.  Patient reports that she has not had any more episodes, she is hydrating more.  Likely etiology was vasovagal or orthostatic.  She feels well.  We did however discuss the MRI of the brain.  Although no focal epileptiform finding was seen, she does have chronic small vessel ischemic disease and we discussed the causes of this including high blood pressure, diabetes, obesity, hyperlipidemia and other vascular risk factors I asked her to follow-up with her primary care for close management of these.  She has had a bilateral microhemorrhages likely due to hypertension and chronic lacunar ischemic infarcts in the bilateral thalami again likely due to vascular risk factors.  IMPRESSION:   MRI brain (with and without) demonstrating: Personally reviewed MRI of the brain and agree with the following. - Mild periventricular and subcortical and pontine and cerebellar chronic small vessel ischemic disease.  - Bilateral basal ganglia, thalami, midbrain and pontine chronic cerebral microhemorrhages. - Chronic lacunar ischemic  infarcts in the bilateral thalami. - No acute findings.  HPI:  JAMES LAFALCE is a 73 y.o. female here as requested by provider Jani Gravel, MD for syncope, rule out seizure.  Past medical history asthma, hypertension, osteoporosis, PMR, obesitymultiple syncopal events since January of this year.  Patient has had multiple syncopal episodes recently and been admitted twice into the hospital cardiac work-up was unremarkable she was dehydrated with acute kidney injury with suspected vasovagal or orthostatic hypotension.  EEG was negative. She is here alone and she is wearing a heart monitor for one month. Patient doesn't remember any o fthe episodes. She would "come to" and she would have throw up, she doesn't remember any urine on her but she did once make a bowel movement. She does not know how long she is unconscious. She does not remember any prodrome to the episode, the third time her stomach was kind of "noisy" and she only ate peanut butter crackers and then she worked and then she noticed paramedics around her. She was able to talk to paramedics and remember the conversation at the scene. No Fhx or personal hx of seizure. She has never had syncopal events until recently. Started the end of December and 3 times in total on the job and once in the ED, not at home. Every time it happened she was in a sitting position. Unknown triggers. No focal weakness. At one point she was bradycardic in the ED and er lips turned blue. No CP, SOB or other associated symptoms. No other focal neurologic deficits, associated symptoms, inciting events or modifiable factors.  Reviewed notes, labs and imaging from outside physicians, which  showed:  Reviewed notes from epic.  Patient's been in the emergency room multiple times in 2019. In fact more than 3 or 4 times since the end of December 2019.  On August 29, 2018 she went in for leg pain.  She works in housekeeping at [employer].  But in January on the 23rd  she went to the emergency room after a syncopal episode at work.  She denied any chest pain or shortness of breath.  She was evaluated by EMS at the time and refused transport.  Patient returned to work and was brought in by a coworker to the emergency room who stated that staff was very concerned and wanted her to be evaluated and that she did not look good.  In the emergency room patient was unresponsive and took 4 people to lift the patient onto the bed, she became incontinent, but lips dusky, patient started to wake up when she was getting undressed.  During this episode it was right after she had blood drawn, resolved on its own, no seizure-like activity, possibly bradycardic during the event and had sinus bradycardia but no sign of heart block on EKG.  Given multiple syncopal events concern for possible cardiac process and she was admitted.  Prior to the syncopal episode she felt pale and vomited and had bladder incontinence.  The hospital suspected vasovagal due to bradycardia with hypotension in the ED and acute kidney injury.She says her mother used to "black out" but unknown diagnosis. Her brother also "blacks out" he had a few of them. But no diagnosis of seizure.   Reviewed EEG on September 23, 2018 which was a normal EEG in the waking and sleep state no seizure or seizure predisposition.  Patient was admitted again to the emergency room on October 10, 2018 after syncopal event at work.  She was sitting in a chair in the break room having lunch and then the paramedics were there.  She was incontinent of stool and bowel.  She underwent negative EEG, TTE with preserved ejection fraction.  Dizzy when she stands up suspicious for orthostatic mechanism.  Unfortunately orthostatic vital signs were not checked on admission.  She felt better after getting IV fluids.  She was recommended to use thigh-high TED hose, and discussed orthostatic precautions.  She will be seeing cardiology outpatient for an  outpatient event monitor.  Ct showed No acute intracranial abnormalities including mass lesion or mass effect, hydrocephalus, extra-axial fluid collection, midline shift, hemorrhage, or acute infarction, large ischemic events (personally reviewed images) 09/22/2018 and 10/10/2018.     Review of Systems: Patient complains of symptoms per HPI as well as the following symptoms: blurred vision. Pertinent negatives and positives per HPI. All others negative.   Social History   Socioeconomic History   Marital status: Divorced    Spouse name: Not on file   Number of children: Not on file   Years of education: Not on file   Highest education level: Not on file  Occupational History   Not on file  Social Needs   Financial resource strain: Not on file   Food insecurity:    Worry: Not on file    Inability: Not on file   Transportation needs:    Medical: Not on file    Non-medical: Not on file  Tobacco Use   Smoking status: Former Smoker    Last attempt to quit: 10/19/2014    Years since quitting: 4.1   Smokeless tobacco: Never Used  Substance and  Sexual Activity   Alcohol use: Yes    Alcohol/week: 0.0 standard drinks    Comment: occasional   Drug use: No   Sexual activity: Never    Comment: 1st intercourse 63 yo-1 partner  Lifestyle   Physical activity:    Days per week: Not on file    Minutes per session: Not on file   Stress: Not on file  Relationships   Social connections:    Talks on phone: Not on file    Gets together: Not on file    Attends religious service: Not on file    Active member of club or organization: Not on file    Attends meetings of clubs or organizations: Not on file    Relationship status: Not on file   Intimate partner violence:    Fear of current or ex partner: Not on file    Emotionally abused: Not on file    Physically abused: Not on file    Forced sexual activity: Not on file  Other Topics Concern   Not on file  Social History  Narrative   Not on file    Family History  Problem Relation Age of Onset   Cancer Mother        THROAT- SMOKER   Diabetes Mother    Seizures Mother    Syncope episode Mother    Diabetes Sister    Heart disease Maternal Aunt    Stroke Maternal Aunt    Cancer Maternal Uncle        LUNG -    Heart disease Maternal Uncle    Stroke Maternal Uncle    Ovarian cancer Maternal Grandmother    Heart attack Father    Heart disease Father    Syncope episode Brother     Past Medical History:  Diagnosis Date   Acid reflux    Asthma    COPD (chronic obstructive pulmonary disease) ([place])    H/O polymyalgia rheumatica    Hypertension    Obesity    Osteoporosis    Syncope    Vitamin D deficiency     Patient Active Problem List   Diagnosis Date Noted   Essential hypertension 10/10/2018   GERD (gastroesophageal reflux disease) 10/10/2018   Syncope and collapse 10/10/2018   AKI (acute kidney injury) ([place]) 10/10/2018   Syncope 09/22/2018   Post-menopause on HRT (hormone replacement therapy) 04/27/2016   Osteoporosis, unspecified 12/06/2013   Vaginal atrophy 12/06/2013    Past Surgical History:  Procedure Laterality Date   ABDOMINAL HYSTERECTOMY     TOTAL ABDOMINAL HYSTERECTOMY    COLONOSCOPY W/ BIOPSIES  2015   ESOPHAGOGASTRODUODENOSCOPY     OOPHORECTOMY     BSO    Current Outpatient Medications  Medication Sig Dispense Refill   amLODipine (NORVASC) 5 MG tablet Take 1 tablet (5 mg total) by mouth daily. 30 tablet 0   aspirin 81 MG tablet Take 81 mg by mouth daily.     cholecalciferol (VITAMIN D) 1000 UNITS tablet Take 1,000 Units by mouth daily.     cyclobenzaprine (FLEXERIL) 10 MG tablet Take 10 mg by mouth 3 (three) times daily as needed for muscle spasms.     feeding supplement, ENSURE ENLIVE, (ENSURE ENLIVE) LIQD Take 237 mLs by mouth 2 (two) times daily between meals. 60 Bottle 0   folic acid (FOLVITE) 1 MG tablet Take 1 mg by  mouth daily.     methotrexate 2.5 MG tablet Take 2.5 mg by mouth once a week. Takes  6 tablets totaling 150 mg     omeprazole (PRILOSEC) 20 MG capsule Take 20 mg by mouth as needed.      predniSONE (DELTASONE) 1 MG tablet Take 1 mg by mouth See admin instructions. Take 9mg  daily for one month, then 8mg  daily for 1 month, then 7mg  daily for one month.     No current facility-administered medications for this visit.     Allergies as of 12/01/2018 - Review Complete 11/29/2018  Allergen Reaction Noted   Fosamax [alendronate sodium] Nausea And Vomiting 11/25/2017    Vitals: There were no vitals taken for this visit. Last Weight:  Wt Readings from Last 1 Encounters:  10/26/18 145 lb (65.8 kg)   Last Height:   Ht Readings from Last 1 Encounters:  10/26/18 5\' 3"  (1.6 m)   PRIOR EXAM, COULD NOT EXAMINE REMOTELY:  Physical exam: Exam: Gen: NAD, conversant                    CV: RRR, no MRG. No Carotid Bruits. No peripheral edema, warm, nontender Eyes: Conjunctivae clear without exudates or hemorrhage  Neuro: Detailed Neurologic Exam  Speech:    Speech is normal; fluent and spontaneous with normal comprehension.  Cognition:    The patient is oriented to person, place, and time;     recent and remote memory intact;     language fluent;     normal attention, concentration,     fund of knowledge Cranial Nerves:    The pupils are equal, round, and reactive to light. Attempted fundoscopic exam could not visualize due to small pupils.  Visual fields are full to finger confrontation. Extraocular movements are intact. Trigeminal sensation is intact and the muscles of mastication are normal. The face is symmetric. The palate elevates in the midline. Hearing intact. Voice is normal. Shoulder shrug is normal. The tongue has normal motion without fasciculations.   Coordination:    Normal finger to nose   Gait:    Normal native gait  Motor Observation:    No asymmetry, no atrophy, and  no involuntary movements noted. Tone:    Normal muscle tone.    Posture:    Posture is normal.     Strength: proximal UE weakness due to shoulder pain otherwise strength is V/V in the upper and lower limbs.      Sensation: intact to LT     Reflex Exam:  DTR's:    Deep tendon reflexes in the upper and lower extremities are brisk bilaterally.   Toes:    The toes are equivocal bilaterally.   Clonus:    Clonus is absent.    Assessment/Plan:  73 y.o. female here as requested by provider Jani Gravel, MD for syncope, rule out seizure.  Past medical history asthma, hypertension, osteoporosis, PMR, obesity, multiple syncopal events since Dec2019/January of this year.  Patient has had multiple syncopal episodes recently and been admitted twice into the hospital cardiac work-up was unremarkable she was dehydrated with acute kidney injury so suspected vasovagal or orthostatic hypotension.  EEG was negative.   -Patient improved, no recent episodes, syncope likely due to dehydration, vasovagal or orthostatic hypotension. -Routine EEG negative.  Extended ambulatory EEG 72 hours negative. -MRI of the brain did not show anything concerning for epileptiform focus however she did have chronic small vessel ischemic disease, microhemorrhages and bilateral chronic lacunar infarcts all due to her vascular risk factors including hypertension, obesity and likely other vascular risk factors.  I have asked patient  to follow-up with her primary care for very strict control of her vascular risk factors, ensure her LDL is less than 70, follow lipids, follow hemoglobin A1c, strictly manage her high blood pressure and continue aspirin at this point.  -- needs f/u with pcp for lipid panel (goal ldl <70) and management of all vascular risk factors.  I had a long d/w patient about her remote lacunar small-vessel strokes, risk for recurrent stroke/TIAs, personally independently reviewed imaging studies and stroke evaluation  results and answered questions.Continue ASA for secondary stroke prevention and maintain strict control of hypertension with blood pressure goal below 130/90, diabetes with hemoglobin A1c goal below 6.5% and lipids with LDL cholesterol goal below 70 mg/dL.  I also advised the patient to eat a healthy diet with plenty of whole grains, cereals, fruits and vegetables, exercise regularly and maintain ideal body weight .Followup in the future if needed.  Patient is unable to drive, operate heavy machinery, perform activities at heights or participate in water activities until 6 months syncope free. Discussed with patient this is [place] law.   Discussed seizure precautions  Call 911 for any alteration of awareness  Follow Up Instructions:    I discussed the assessment and treatment plan with the patient. The patient was provided an opportunity to ask questions and all were answered. The patient agreed with the plan and demonstrated an understanding of the instructions.   The patient was advised to call back or seek an in-person evaluation if the symptoms worsen or if the condition fails to improve as anticipated.  A total of 25  minutes was spent Video face-to-face with this patient. Over half this time was spent on counseling patient on the  1. Small vessel disease, cerebrovascular   2. Vasovagal syncope   3. Thalamic stroke ([place])   4. Lacunar stroke ([place])   5. Hypertension, unspecified type    diagnosis and different diagnostic and therapeutic options, counseling and coordination of care, risks ans benefits of management, compliance, or risk factor reduction and education.     Cc: Jani Gravel, MD,    Sarina Ill, MD  [facility] [address] [place], [place] [address]  Phone [phone] Fax [phone]

## 2018-12-05 ENCOUNTER — Other Ambulatory Visit: Payer: Medicare Other

## 2018-12-05 ENCOUNTER — Encounter: Payer: Self-pay | Admitting: Neurology

## 2018-12-30 ENCOUNTER — Ambulatory Visit
Admission: RE | Admit: 2018-12-30 | Discharge: 2018-12-30 | Disposition: A | Discharge status: Home / Self Care | Payer: Medicare Other | Source: Ambulatory Visit | Attending: Otolaryngology | Admitting: Otolaryngology

## 2018-12-30 DIAGNOSIS — R1313 Dysphagia, pharyngeal phase: Secondary | ICD-10-CM

## 2018-12-30 DIAGNOSIS — R1310 Dysphagia, unspecified: Secondary | ICD-10-CM | POA: Diagnosis not present

## 2019-01-02 DIAGNOSIS — M79641 Pain in right hand: Secondary | ICD-10-CM | POA: Diagnosis not present

## 2019-01-06 DIAGNOSIS — M79643 Pain in unspecified hand: Secondary | ICD-10-CM | POA: Diagnosis not present

## 2019-01-06 DIAGNOSIS — M810 Age-related osteoporosis without current pathological fracture: Secondary | ICD-10-CM | POA: Diagnosis not present

## 2019-01-06 DIAGNOSIS — Z7952 Long term (current) use of systemic steroids: Secondary | ICD-10-CM | POA: Diagnosis not present

## 2019-01-06 DIAGNOSIS — M353 Polymyalgia rheumatica: Secondary | ICD-10-CM | POA: Diagnosis not present

## 2019-01-06 DIAGNOSIS — M1990 Unspecified osteoarthritis, unspecified site: Secondary | ICD-10-CM | POA: Diagnosis not present

## 2019-01-12 ENCOUNTER — Telehealth: Payer: Self-pay

## 2019-01-12 NOTE — Telephone Encounter (Signed)
Unable to get in contact with the patient to convert their office visit with Amy on 01/18/2019 into a doxy.me visit. I left a voicemail asking the patient to return my call. Office number was provided.   If patient calls back please convert their office visit into a doxy.me visit.

## 2019-01-17 ENCOUNTER — Other Ambulatory Visit: Payer: Medicare Other

## 2019-01-18 ENCOUNTER — Ambulatory Visit: Payer: Medicare Other | Admitting: Family Medicine

## 2019-02-01 DIAGNOSIS — E559 Vitamin D deficiency, unspecified: Secondary | ICD-10-CM | POA: Diagnosis not present

## 2019-02-01 DIAGNOSIS — I10 Essential (primary) hypertension: Secondary | ICD-10-CM | POA: Diagnosis not present

## 2019-02-01 DIAGNOSIS — R739 Hyperglycemia, unspecified: Secondary | ICD-10-CM | POA: Diagnosis not present

## 2019-02-08 DIAGNOSIS — R739 Hyperglycemia, unspecified: Secondary | ICD-10-CM | POA: Diagnosis not present

## 2019-02-08 DIAGNOSIS — I10 Essential (primary) hypertension: Secondary | ICD-10-CM | POA: Diagnosis not present

## 2019-02-08 DIAGNOSIS — I639 Cerebral infarction, unspecified: Secondary | ICD-10-CM | POA: Diagnosis not present

## 2019-02-17 DIAGNOSIS — M810 Age-related osteoporosis without current pathological fracture: Secondary | ICD-10-CM | POA: Diagnosis not present

## 2019-02-17 DIAGNOSIS — M1990 Unspecified osteoarthritis, unspecified site: Secondary | ICD-10-CM | POA: Diagnosis not present

## 2019-02-17 DIAGNOSIS — M353 Polymyalgia rheumatica: Secondary | ICD-10-CM | POA: Diagnosis not present

## 2019-02-17 DIAGNOSIS — M79643 Pain in unspecified hand: Secondary | ICD-10-CM | POA: Diagnosis not present

## 2019-02-17 DIAGNOSIS — Z7952 Long term (current) use of systemic steroids: Secondary | ICD-10-CM | POA: Diagnosis not present

## 2019-03-07 DIAGNOSIS — M25562 Pain in left knee: Secondary | ICD-10-CM | POA: Diagnosis not present

## 2019-05-17 DIAGNOSIS — M1990 Unspecified osteoarthritis, unspecified site: Secondary | ICD-10-CM | POA: Diagnosis not present

## 2019-05-17 DIAGNOSIS — M810 Age-related osteoporosis without current pathological fracture: Secondary | ICD-10-CM | POA: Diagnosis not present

## 2019-05-17 DIAGNOSIS — M353 Polymyalgia rheumatica: Secondary | ICD-10-CM | POA: Diagnosis not present

## 2019-05-17 DIAGNOSIS — Z7952 Long term (current) use of systemic steroids: Secondary | ICD-10-CM | POA: Diagnosis not present

## 2019-05-17 DIAGNOSIS — M79643 Pain in unspecified hand: Secondary | ICD-10-CM | POA: Diagnosis not present

## 2019-06-14 DIAGNOSIS — M1712 Unilateral primary osteoarthritis, left knee: Secondary | ICD-10-CM | POA: Diagnosis not present

## 2019-06-14 DIAGNOSIS — M25562 Pain in left knee: Secondary | ICD-10-CM | POA: Diagnosis not present

## 2019-06-16 ENCOUNTER — Other Ambulatory Visit: Payer: Self-pay | Admitting: Internal Medicine

## 2019-06-16 DIAGNOSIS — Z1231 Encounter for screening mammogram for malignant neoplasm of breast: Secondary | ICD-10-CM

## 2019-08-09 ENCOUNTER — Ambulatory Visit: Payer: Medicare Other

## 2019-08-09 DIAGNOSIS — E559 Vitamin D deficiency, unspecified: Secondary | ICD-10-CM | POA: Diagnosis not present

## 2019-08-09 DIAGNOSIS — M79643 Pain in unspecified hand: Secondary | ICD-10-CM | POA: Diagnosis not present

## 2019-08-09 DIAGNOSIS — M353 Polymyalgia rheumatica: Secondary | ICD-10-CM | POA: Diagnosis not present

## 2019-08-09 DIAGNOSIS — I10 Essential (primary) hypertension: Secondary | ICD-10-CM | POA: Diagnosis not present

## 2019-08-09 DIAGNOSIS — Z7952 Long term (current) use of systemic steroids: Secondary | ICD-10-CM | POA: Diagnosis not present

## 2019-08-09 DIAGNOSIS — R739 Hyperglycemia, unspecified: Secondary | ICD-10-CM | POA: Diagnosis not present

## 2019-08-09 DIAGNOSIS — M1990 Unspecified osteoarthritis, unspecified site: Secondary | ICD-10-CM | POA: Diagnosis not present

## 2019-08-09 DIAGNOSIS — I639 Cerebral infarction, unspecified: Secondary | ICD-10-CM | POA: Diagnosis not present

## 2019-08-09 DIAGNOSIS — M810 Age-related osteoporosis without current pathological fracture: Secondary | ICD-10-CM | POA: Diagnosis not present

## 2019-08-11 ENCOUNTER — Ambulatory Visit
Admission: RE | Admit: 2019-08-11 | Discharge: 2019-08-11 | Disposition: A | Discharge status: Home / Self Care | Payer: Medicare Other | Source: Ambulatory Visit | Attending: Internal Medicine | Admitting: Internal Medicine

## 2019-08-11 ENCOUNTER — Ambulatory Visit: Payer: Medicare Other

## 2019-08-11 ENCOUNTER — Other Ambulatory Visit: Payer: Self-pay

## 2019-08-11 DIAGNOSIS — Z1231 Encounter for screening mammogram for malignant neoplasm of breast: Secondary | ICD-10-CM

## 2019-08-16 DIAGNOSIS — Z23 Encounter for immunization: Secondary | ICD-10-CM | POA: Diagnosis not present

## 2019-08-16 DIAGNOSIS — Z0000 Encounter for general adult medical examination without abnormal findings: Secondary | ICD-10-CM | POA: Diagnosis not present

## 2019-08-16 DIAGNOSIS — I639 Cerebral infarction, unspecified: Secondary | ICD-10-CM | POA: Diagnosis not present

## 2019-08-23 DIAGNOSIS — R3121 Asymptomatic microscopic hematuria: Secondary | ICD-10-CM | POA: Diagnosis not present

## 2019-08-23 DIAGNOSIS — M810 Age-related osteoporosis without current pathological fracture: Secondary | ICD-10-CM | POA: Diagnosis not present

## 2019-08-23 DIAGNOSIS — N952 Postmenopausal atrophic vaginitis: Secondary | ICD-10-CM | POA: Diagnosis not present

## 2019-09-13 DIAGNOSIS — M8589 Other specified disorders of bone density and structure, multiple sites: Secondary | ICD-10-CM | POA: Diagnosis not present

## 2019-09-13 DIAGNOSIS — I10 Essential (primary) hypertension: Secondary | ICD-10-CM | POA: Diagnosis not present

## 2019-09-13 DIAGNOSIS — M1612 Unilateral primary osteoarthritis, left hip: Secondary | ICD-10-CM | POA: Diagnosis not present

## 2019-09-13 DIAGNOSIS — M25552 Pain in left hip: Secondary | ICD-10-CM | POA: Diagnosis not present

## 2019-09-13 DIAGNOSIS — R7303 Prediabetes: Secondary | ICD-10-CM | POA: Diagnosis not present

## 2019-09-14 DIAGNOSIS — M47816 Spondylosis without myelopathy or radiculopathy, lumbar region: Secondary | ICD-10-CM | POA: Diagnosis not present

## 2019-09-14 DIAGNOSIS — M79643 Pain in unspecified hand: Secondary | ICD-10-CM | POA: Diagnosis not present

## 2019-09-14 DIAGNOSIS — M1990 Unspecified osteoarthritis, unspecified site: Secondary | ICD-10-CM | POA: Diagnosis not present

## 2019-09-14 DIAGNOSIS — Z7952 Long term (current) use of systemic steroids: Secondary | ICD-10-CM | POA: Diagnosis not present

## 2019-09-14 DIAGNOSIS — M1612 Unilateral primary osteoarthritis, left hip: Secondary | ICD-10-CM | POA: Diagnosis not present

## 2019-09-14 DIAGNOSIS — M353 Polymyalgia rheumatica: Secondary | ICD-10-CM | POA: Diagnosis not present

## 2019-09-14 DIAGNOSIS — M810 Age-related osteoporosis without current pathological fracture: Secondary | ICD-10-CM | POA: Diagnosis not present

## 2019-09-19 DIAGNOSIS — M1712 Unilateral primary osteoarthritis, left knee: Secondary | ICD-10-CM | POA: Diagnosis not present

## 2019-09-26 DIAGNOSIS — M1712 Unilateral primary osteoarthritis, left knee: Secondary | ICD-10-CM | POA: Diagnosis not present

## 2019-09-27 DIAGNOSIS — I10 Essential (primary) hypertension: Secondary | ICD-10-CM | POA: Diagnosis not present

## 2019-09-27 DIAGNOSIS — M1612 Unilateral primary osteoarthritis, left hip: Secondary | ICD-10-CM | POA: Diagnosis not present

## 2019-09-27 DIAGNOSIS — M25552 Pain in left hip: Secondary | ICD-10-CM | POA: Diagnosis not present

## 2019-09-27 DIAGNOSIS — R7303 Prediabetes: Secondary | ICD-10-CM | POA: Diagnosis not present

## 2019-09-27 DIAGNOSIS — E559 Vitamin D deficiency, unspecified: Secondary | ICD-10-CM | POA: Diagnosis not present

## 2019-10-03 DIAGNOSIS — M1612 Unilateral primary osteoarthritis, left hip: Secondary | ICD-10-CM | POA: Diagnosis not present

## 2019-10-11 DIAGNOSIS — M79643 Pain in unspecified hand: Secondary | ICD-10-CM | POA: Diagnosis not present

## 2019-10-11 DIAGNOSIS — M810 Age-related osteoporosis without current pathological fracture: Secondary | ICD-10-CM | POA: Diagnosis not present

## 2019-10-11 DIAGNOSIS — Z7952 Long term (current) use of systemic steroids: Secondary | ICD-10-CM | POA: Diagnosis not present

## 2019-10-11 DIAGNOSIS — M25531 Pain in right wrist: Secondary | ICD-10-CM | POA: Diagnosis not present

## 2019-10-11 DIAGNOSIS — M353 Polymyalgia rheumatica: Secondary | ICD-10-CM | POA: Diagnosis not present

## 2019-10-11 DIAGNOSIS — M1990 Unspecified osteoarthritis, unspecified site: Secondary | ICD-10-CM | POA: Diagnosis not present

## 2019-10-25 DIAGNOSIS — I10 Essential (primary) hypertension: Secondary | ICD-10-CM | POA: Diagnosis not present

## 2019-10-31 DIAGNOSIS — M1612 Unilateral primary osteoarthritis, left hip: Secondary | ICD-10-CM | POA: Diagnosis not present

## 2019-10-31 DIAGNOSIS — M7062 Trochanteric bursitis, left hip: Secondary | ICD-10-CM | POA: Diagnosis not present

## 2019-10-31 DIAGNOSIS — M1712 Unilateral primary osteoarthritis, left knee: Secondary | ICD-10-CM | POA: Diagnosis not present

## 2019-11-22 DIAGNOSIS — M79643 Pain in unspecified hand: Secondary | ICD-10-CM | POA: Diagnosis not present

## 2019-11-22 DIAGNOSIS — Z7952 Long term (current) use of systemic steroids: Secondary | ICD-10-CM | POA: Diagnosis not present

## 2019-11-22 DIAGNOSIS — M353 Polymyalgia rheumatica: Secondary | ICD-10-CM | POA: Diagnosis not present

## 2019-11-22 DIAGNOSIS — M810 Age-related osteoporosis without current pathological fracture: Secondary | ICD-10-CM | POA: Diagnosis not present

## 2019-11-22 DIAGNOSIS — M25519 Pain in unspecified shoulder: Secondary | ICD-10-CM | POA: Diagnosis not present

## 2019-11-24 DIAGNOSIS — M1612 Unilateral primary osteoarthritis, left hip: Secondary | ICD-10-CM | POA: Diagnosis not present

## 2019-12-06 DIAGNOSIS — R739 Hyperglycemia, unspecified: Secondary | ICD-10-CM | POA: Diagnosis not present

## 2019-12-06 DIAGNOSIS — Z01818 Encounter for other preprocedural examination: Secondary | ICD-10-CM | POA: Diagnosis not present

## 2019-12-25 DIAGNOSIS — M1612 Unilateral primary osteoarthritis, left hip: Secondary | ICD-10-CM | POA: Diagnosis not present

## 2019-12-25 DIAGNOSIS — M1712 Unilateral primary osteoarthritis, left knee: Secondary | ICD-10-CM | POA: Diagnosis not present

## 2019-12-26 DIAGNOSIS — J45909 Unspecified asthma, uncomplicated: Secondary | ICD-10-CM | POA: Diagnosis present

## 2019-12-26 DIAGNOSIS — M1612 Unilateral primary osteoarthritis, left hip: Secondary | ICD-10-CM | POA: Diagnosis present

## 2019-12-26 NOTE — H&P (Signed)
HIP ARTHROPLASTY ADMISSION H&P  Patient ID: Heather Ellis MRN: [id number] DOB/AGE: 03/19/1946 74 y.o.  Chief Complaint: left hip pain.  Planned Procedure Date: 01/16/2020 Medical Clearance by Dr. Maudie Mercury   HPI: Heather Ellis is a 74 y.o. female with a history of hypertension, GERD, asthma, COPD who presents for evaluation of OA LEFT HIP. The patient has a history of pain and functional disability in the left hip due to arthritis and has failed non-surgical conservative treatments for greater than 12 weeks to include NSAID's and/or analgesics, corticosteriod injections and activity modification.  Onset of symptoms was gradual, starting 2 years ago with gradually worsening course since that time. Patient currently rates pain at 8 out of 10 with activity. Patient has night pain, worsening of pain with activity and weight bearing and pain that interferes with activities of daily living.  Patient has evidence of periarticular osteophytes and joint space narrowing by imaging studies.  There is no active infection.  Past Medical History:  Diagnosis Date  . Acid reflux   . Asthma   . COPD (chronic obstructive pulmonary disease) ([place])   . H/O polymyalgia rheumatica   . Hypertension   . Obesity   . Osteoporosis   . Syncope   . Vitamin D deficiency    Past Surgical History:  Procedure Laterality Date  . ABDOMINAL HYSTERECTOMY     TOTAL ABDOMINAL HYSTERECTOMY   . COLONOSCOPY W/ BIOPSIES  2015  . ESOPHAGOGASTRODUODENOSCOPY    . OOPHORECTOMY     BSO   Allergies  Allergen Reactions  . Fosamax [Alendronate Sodium] Nausea And Vomiting   Prior to Admission medications   Medication Sig Start Date End Date Taking? Authorizing Provider  acetaminophen (TYLENOL) 500 MG tablet Take 500 mg by mouth every 6 (six) hours as needed (for pain.).   Yes [provider]  amLODipine (NORVASC) 10 MG tablet Take 10 mg by mouth daily. 09/13/19  Yes [provider]  aspirin EC 81  MG tablet Take 81 mg by mouth daily.   Yes [provider]  bisoprolol-hydrochlorothiazide (ZIAC) 5-6.25 MG tablet Take 2 tablets by mouth daily. 09/27/19  Yes [provider]  cholecalciferol (VITAMIN D) 1000 UNITS tablet Take 1,000 Units by mouth daily.   Yes [provider]  folic acid (FOLVITE) 1 MG tablet Take 1 mg by mouth daily. 09/11/18  Yes [provider]  lisinopril (ZESTRIL) 40 MG tablet Take 40 mg by mouth daily. 12/21/19  Yes [provider]  methotrexate 2.5 MG tablet Take 20 mg by mouth every Monday. Takes 8 tablets   Yes [provider]  naproxen sodium (ALEVE) 220 MG tablet Take 440 mg by mouth 2 (two) times daily as needed (pain.).   Yes [provider]  predniSONE (DELTASONE) 5 MG tablet Take 10 mg by mouth daily with breakfast.   Yes [provider]  cyclobenzaprine (FLEXERIL) 10 MG tablet Take 10 mg by mouth 3 (three) times daily as needed for muscle spasms.    [provider]  feeding supplement, ENSURE ENLIVE, (ENSURE ENLIVE) LIQD Take 237 mLs by mouth 2 (two) times daily between meals. 10/11/18   Ghimire, Henreitta Leber, MD  omeprazole (PRILOSEC) 20 MG capsule Take 20 mg by mouth as needed.     [provider]   Social History   Socioeconomic History  . Marital status: Divorced    Spouse name: Not on file  . Number of children: Not on file  . Years of education: Not  on file  . Highest education level: Not on file  Occupational History  . Not on file  Tobacco Use  . Smoking status: Former Smoker    Quit date: 10/19/2014    Years since quitting: 5.1  . Smokeless tobacco: Never Used  Substance and Sexual Activity  . Alcohol use: Yes    Alcohol/week: 0.0 standard drinks    Comment: occasional  . Drug use: No  . Sexual activity: Never    Comment: 1st intercourse 28 yo-1 partner  Other Topics Concern  . Not on file  Social History Narrative  . Not on file   Social Determinants of  Health   Financial Resource Strain:   . Difficulty of Paying Living Expenses:   Food Insecurity:   . Worried About Charity fundraiser in the Last Year:   . Arboriculturist in the Last Year:   Transportation Needs:   . Film/video editor (Medical):   Marland Kitchen Lack of Transportation (Non-Medical):   Physical Activity:   . Days of Exercise per Week:   . Minutes of Exercise per Session:   Stress:   . Feeling of Stress :   Social Connections:   . Frequency of Communication with Friends and Family:   . Frequency of Social Gatherings with Friends and Family:   . Attends Religious Services:   . Active Member of Clubs or Organizations:   . Attends Archivist Meetings:   Marland Kitchen Marital Status:    Family History  Problem Relation Age of Onset  . Cancer Mother        THROAT- SMOKER  . Diabetes Mother   . Seizures Mother   . Syncope episode Mother   . Diabetes Sister   . Heart disease Maternal Aunt   . Stroke Maternal Aunt   . Cancer Maternal Uncle        LUNG -   . Heart disease Maternal Uncle   . Stroke Maternal Uncle   . Ovarian cancer Maternal Grandmother   . Heart attack Father   . Heart disease Father   . Syncope episode Brother     ROS: Currently denies lightheadedness, dizziness, Fever, chills, CP, SOB.   No personal history of DVT, PE, MI, or CVA.  She has dentures. All other systems have been reviewed and were otherwise currently negative with the exception of those mentioned in the HPI and as above.  Objective: Vitals: Ht: 5 feet 2 inches wt: 148 lbs Temp: 97.3 BP: 136/81 pulse: 69 O2 95% on room air.   Physical Exam: General: Alert, NAD. Trendelenberg Gait.  Unaided. HEENT: EOMI, Good Neck Extension  Pulm: No increased work of breathing.  Clear B/L A/P w/o crackle or wheeze.  CV: RRR, No m/g/r appreciated  GI: soft, NT, ND Neuro: Neuro without gross focal deficit.  Sensation intact distally Skin: No lesions in the area of chief complaint MSK/Surgical Site:  Left hip pain with passive ROM.  Positive Stinchfield.  5/5 strength.  NVI.    Imaging Review Plain radiographs demonstrate severe degenerative joint disease of the left hip.   Preoperative templating of the joint replacement has been completed, documented, and submitted to the Operating Room personnel in order to optimize intra-operative equipment management.  Assessment: OA LEFT HIP Principal Problem:   Primary osteoarthritis of left hip Active Problems:   Essential hypertension   GERD (gastroesophageal reflux disease)   Asthma   Plan: Plan for Procedure(s): TOTAL HIP ARTHROPLASTY ANTERIOR APPROACH  The patient  history, physical exam, clinical judgement of the provider and imaging are consistent with end stage degenerative joint disease and total joint arthroplasty is deemed medically necessary. The treatment options including medical management, injection therapy, and arthroplasty were discussed at length. The risks and benefits of Procedure(s): TOTAL HIP ARTHROPLASTY ANTERIOR APPROACH were presented and reviewed.  The risks of nonoperative treatment, versus surgical intervention including but not limited to continued pain, aseptic loosening, stiffness, dislocation/subluxation, infection, bleeding, nerve injury, blood clots, cardiopulmonary complications, morbidity, mortality, among others were discussed. The patient verbalizes understanding and wishes to proceed with the plan.  Patient is being admitted for surgery, pain control, PT, prophylactic antibiotics, VTE prophylaxis, progressive ambulation, ADL's and discharge planning.   Dental prophylaxis discussed and recommended for 2 years postoperatively.   The patient does meet the criteria for TXA which will be used perioperatively.    ASA 81 mg BID will be used postoperatively for DVT prophylaxis in addition to SCDs, and early ambulation.  Plan for Norco, 100 mg Celebrex for pain.  Continue Flexeril for spasm.  Continue  omeprazole for gastric protection.  The patient is planning to be discharged home with HHPT ([facility]) in care of her daughter.   Patient's anticipated LOS is less than 2 midnights, meeting these requirements: - Lives within 1 hour of care - Has a competent adult at home to recover with post-op recover - NO history of  - Chronic pain requiring opiods  - Diabetes  - Coronary Artery Disease  - Heart failure  - Heart attack  - Stroke  - DVT/VTE  - Cardiac arrhythmia  - Respiratory Failure/COPD  - Renal failure  - Anemia  - Advanced Liver disease   Prudencio Burly III, PA-C 12/26/2019 3:32 PM

## 2020-01-04 ENCOUNTER — Encounter (HOSPITAL_COMMUNITY): Payer: Self-pay

## 2020-01-04 NOTE — Patient Instructions (Addendum)
DUE TO COVID-19 ONLY ONE VISITOR ARE ALLOWED TO COME WITH YOU AND STAY IN THE WAITING ROOM ONLY DURING PRE OP AND PROCEDURE. THEN TWO VISITORS MAY VISIT WITH YOU IN YOUR PRIVATE ROOM DURING VISITING HOURS ONLY!!   COVID SWAB TESTING MUST BE COMPLETED ON:  Friday, Jan 12, 2020 [facility]  [place] (Must self quarantine after testing. Follow instructions on handout.)             Your procedure is scheduled on: Tuesday, Jan 16, 2020   Report to [facility] Main  Entrance   Report to Short Stay at 5:30 AM   [facility])   Call this number if you have problems the morning of surgery [phone]   Do not eat food:After Midnight.   May have liquids until 4:30 AM day of surgery   CLEAR LIQUID DIET  Foods Allowed                                                                     Foods Excluded  Water, Black Coffee and tea, regular and decaf                             liquids that you cannot  Plain Jell-O in any flavor  (No red)                                           see through such as: Fruit ices (not with fruit pulp)                                     milk, soups, orange juice  Iced Popsicles (No red)                                    All solid food Carbonated beverages, regular and diet                                    Apple juices Sports drinks like Gatorade (No red) Lightly seasoned clear broth or consume(fat free) Sugar, honey syrup  Sample Menu Breakfast                                Lunch                                     Supper Cranberry juice                    Beef broth  Chicken broth Jell-O                                     Grape juice                           Apple juice Coffee or tea                        Jell-O                                      Popsicle                                                Coffee or tea                         Coffee or tea   Complete one Ensure drink the morning of surgery at 4:30 AM the day of surgery.   Oral Hygiene is also important to reduce your risk of infection.                                    Remember - BRUSH YOUR TEETH THE MORNING OF SURGERY WITH YOUR REGULAR TOOTHPASTE   Do NOT smoke after Midnight   Take these medicines the morning of surgery with A SIP OF WATER: Amlodipine, Prednisone, Omeprazole                               You may not have any metal on your body including hair pins, jewelry, and body piercings             Do not wear make-up, lotions, powders, perfumes/cologne, or deodorant             Do not wear nail polish.  Do not shave  48 hours prior to surgery.                Do not bring valuables to the hospital. [place].   Contacts, dentures or bridgework may not be worn into surgery.   Bring small overnight bag day of surgery.    Patients discharged the day of surgery will not be allowed to drive home.   Special Instructions: Bring a copy of your healthcare power of attorney and living will documents         the day of surgery if you haven't scanned them in before.              Please read over the following fact sheets you were given: IF YOU HAVE QUESTIONS ABOUT YOUR PRE OP INSTRUCTIONS PLEASE CALL [phone]   [facility] - Preparing for Surgery Before surgery, you can play an important role.  Because skin is not sterile, your skin needs to be as free of germs as possible.  You can reduce the number of germs on  your skin by washing with CHG (chlorahexidine gluconate) soap before surgery.  CHG is an antiseptic cleaner which kills germs and bonds with the skin to continue killing germs even after washing. Please DO NOT use if you have an allergy to CHG or antibacterial soaps.  If your skin becomes reddened/irritated stop using the CHG and inform your nurse when you arrive at Short Stay. Do not shave  (including legs and underarms) for at least 48 hours prior to the first CHG shower.  You may shave your face/neck.  Please follow these instructions carefully:  1.  Shower with CHG Soap the night before surgery and the  morning of surgery.  2.  If you choose to wash your hair, wash your hair first as usual with your normal  shampoo.  3.  After you shampoo, rinse your hair and body thoroughly to remove the shampoo.                             4.  Use CHG as you would any other liquid soap.  You can apply chg directly to the skin and wash.  Gently with a scrungie or clean washcloth.  5.  Apply the CHG Soap to your body ONLY FROM THE NECK DOWN.   Do   not use on face/ open                           Wound or open sores. Avoid contact with eyes, ears mouth and   genitals (private parts).                       Wash face,  Genitals (private parts) with your normal soap.             6.  Wash thoroughly, paying special attention to the area where your    surgery  will be performed.  7.  Thoroughly rinse your body with warm water from the neck down.  8.  DO NOT shower/wash with your normal soap after using and rinsing off the CHG Soap.                9.  Pat yourself dry with a clean towel.            10.  Wear clean pajamas.            11.  Place clean sheets on your bed the night of your first shower and do not  sleep with pets. Day of Surgery : Do not apply any lotions/deodorants the morning of surgery.  Please wear clean clothes to the hospital/surgery center.  FAILURE TO FOLLOW THESE INSTRUCTIONS MAY RESULT IN THE CANCELLATION OF YOUR SURGERY  PATIENT SIGNATURE_________________________________  NURSE SIGNATURE__________________________________  ________________________________________________________________________   Adam Phenix  An incentive spirometer is a tool that can help keep your lungs clear and active. This tool measures how well you are filling your lungs with each breath.  Taking long deep breaths may help reverse or decrease the chance of developing breathing (pulmonary) problems (especially infection) following:  A long period of time when you are unable to move or be active. BEFORE THE PROCEDURE   If the spirometer includes an indicator to show your best effort, your nurse or respiratory therapist will set it to a desired goal.  If possible, sit up straight or lean slightly forward. Try not to slouch.  Hold  the incentive spirometer in an upright position. INSTRUCTIONS FOR USE  1. Sit on the edge of your bed if possible, or sit up as far as you can in bed or on a chair. 2. Hold the incentive spirometer in an upright position. 3. Breathe out normally. 4. Place the mouthpiece in your mouth and seal your lips tightly around it. 5. Breathe in slowly and as deeply as possible, raising the piston or the ball toward the top of the column. 6. Hold your breath for 3-5 seconds or for as long as possible. Allow the piston or ball to fall to the bottom of the column. 7. Remove the mouthpiece from your mouth and breathe out normally. 8. Rest for a few seconds and repeat Steps 1 through 7 at least 10 times every 1-2 hours when you are awake. Take your time and take a few normal breaths between deep breaths. 9. The spirometer may include an indicator to show your best effort. Use the indicator as a goal to work toward during each repetition. 10. After each set of 10 deep breaths, practice coughing to be sure your lungs are clear. If you have an incision (the cut made at the time of surgery), support your incision when coughing by placing a pillow or rolled up towels firmly against it. Once you are able to get out of bed, walk around indoors and cough well. You may stop using the incentive spirometer when instructed by your caregiver.  RISKS AND COMPLICATIONS  Take your time so you do not get dizzy or light-headed.  If you are in pain, you may need to take or ask for pain  medication before doing incentive spirometry. It is harder to take a deep breath if you are having pain. AFTER USE  Rest and breathe slowly and easily.  It can be helpful to keep track of a log of your progress. Your caregiver can provide you with a simple table to help with this. If you are using the spirometer at home, follow these instructions: [place] IF:   You are having difficultly using the spirometer.  You have trouble using the spirometer as often as instructed.  Your pain medication is not giving enough relief while using the spirometer.  You develop fever of 100.5 F (38.1 C) or higher. SEEK IMMEDIATE MEDICAL CARE IF:   You cough up bloody sputum that had not been present before.  You develop fever of 102 F (38.9 C) or greater.  You develop worsening pain at or near the incision site. MAKE SURE YOU:   Understand these instructions.  Will watch your condition.  Will get help right away if you are not doing well or get worse. Document Released: 12/28/2006 Document Revised: 11/09/2011 Document Reviewed: 02/28/2007 [facility] Patient Information 2014 [place], [place].   ________________________________________________________________________

## 2020-01-05 ENCOUNTER — Encounter (HOSPITAL_COMMUNITY)
Admission: RE | Admit: 2020-01-05 | Discharge: 2020-01-05 | Disposition: A | Discharge status: Home / Self Care | Payer: Medicare Other | Source: Ambulatory Visit | Attending: Orthopedic Surgery | Admitting: Orthopedic Surgery

## 2020-01-05 ENCOUNTER — Other Ambulatory Visit: Payer: Self-pay

## 2020-01-05 ENCOUNTER — Encounter (HOSPITAL_COMMUNITY): Payer: Self-pay

## 2020-01-05 DIAGNOSIS — M1612 Unilateral primary osteoarthritis, left hip: Secondary | ICD-10-CM | POA: Insufficient documentation

## 2020-01-05 DIAGNOSIS — Z79899 Other long term (current) drug therapy: Secondary | ICD-10-CM | POA: Insufficient documentation

## 2020-01-05 DIAGNOSIS — I10 Essential (primary) hypertension: Secondary | ICD-10-CM | POA: Insufficient documentation

## 2020-01-05 DIAGNOSIS — E559 Vitamin D deficiency, unspecified: Secondary | ICD-10-CM | POA: Diagnosis not present

## 2020-01-05 DIAGNOSIS — K219 Gastro-esophageal reflux disease without esophagitis: Secondary | ICD-10-CM | POA: Diagnosis not present

## 2020-01-05 DIAGNOSIS — Z8616 Personal history of COVID-19: Secondary | ICD-10-CM | POA: Insufficient documentation

## 2020-01-05 DIAGNOSIS — J449 Chronic obstructive pulmonary disease, unspecified: Secondary | ICD-10-CM | POA: Diagnosis not present

## 2020-01-05 DIAGNOSIS — Z7982 Long term (current) use of aspirin: Secondary | ICD-10-CM | POA: Insufficient documentation

## 2020-01-05 DIAGNOSIS — Z01818 Encounter for other preprocedural examination: Secondary | ICD-10-CM | POA: Insufficient documentation

## 2020-01-05 DIAGNOSIS — Z87891 Personal history of nicotine dependence: Secondary | ICD-10-CM | POA: Diagnosis not present

## 2020-01-05 DIAGNOSIS — R7303 Prediabetes: Secondary | ICD-10-CM | POA: Diagnosis not present

## 2020-01-05 HISTORY — DX: Anemia, unspecified: D64.9

## 2020-01-05 HISTORY — DX: Prediabetes: R73.03

## 2020-01-05 HISTORY — DX: Unspecified osteoarthritis, unspecified site: M19.90

## 2020-01-05 LAB — BASIC METABOLIC PANEL
Anion gap: 9 (ref 5–15)
BUN: 11 mg/dL (ref 8–23)
CO2: 32 mmol/L (ref 22–32)
Calcium: 9 mg/dL (ref 8.9–10.3)
Chloride: 101 mmol/L (ref 98–111)
Creatinine, Ser: 0.56 mg/dL (ref 0.44–1.00)
GFR calc Af Amer: >60 mL/min (ref >60)
GFR calc non Af Amer: >60 mL/min (ref >60)
Glucose, Bld: 90 mg/dL (ref 70–99)
Potassium: 3.5 mmol/L (ref 3.5–5.1)
Sodium: 142 mmol/L (ref 135–145)

## 2020-01-05 LAB — SURGICAL PCR SCREEN
MRSA, PCR: POSITIVE — AB
Staphylococcus aureus: POSITIVE — AB

## 2020-01-05 LAB — CBC
HCT: 36.6 % (ref 36.0–46.0)
Hemoglobin: 11.1 g/dL — ABNORMAL LOW (ref 12.0–15.0)
MCH: 31.1 pg (ref 26.0–34.0)
MCHC: 30.3 g/dL (ref 30.0–36.0)
MCV: 102.5 fL — ABNORMAL HIGH (ref 80.0–100.0)
Platelets: 226 10*3/uL (ref 150–400)
RBC: 3.57 MIL/uL — ABNORMAL LOW (ref 3.87–5.11)
RDW: 14.9 % (ref 11.5–15.5)
WBC: 5.7 10*3/uL (ref 4.0–10.5)
nRBC: 0 % (ref 0.0–0.2)

## 2020-01-05 LAB — URINALYSIS, ROUTINE W REFLEX MICROSCOPIC
Bilirubin Urine: NEGATIVE
Glucose, UA: NEGATIVE mg/dL
Hgb urine dipstick: NEGATIVE
Ketones, ur: NEGATIVE mg/dL
Leukocytes,Ua: NEGATIVE
Nitrite: NEGATIVE
Protein, ur: NEGATIVE mg/dL
Specific Gravity, Urine: 1.02 (ref 1.005–1.030)
pH: 5 (ref 5.0–8.0)

## 2020-01-05 LAB — HEMOGLOBIN A1C
Hgb A1c MFr Bld: 5.6 % (ref 4.8–5.6)
Mean Plasma Glucose: 114.02 mg/dL

## 2020-01-05 NOTE — Progress Notes (Signed)
Has completed COVID 19 vaccine series PCP - Dr. Georges Mouse clearance 12/06/19 in chart Cardiologist - N/A  Chest x-ray - greater than 1 year EKG - 12/06/19 in chart Stress Test - greater than 2 years ECHO - 09/23/18 in epic Cardiac Cath - N/A  Sleep Study - N/A CPAP - N/A  Fasting Blood Sugar - N/A Checks Blood Sugar _N/A____ times a day  Blood Thinner Instructions: N/A Aspirin Instructions: Yes Last Dose: 01/08/20  Anesthesia review: COPD, HTN  Patient denies shortness of breath, fever, cough and chest pain at PAT appointment   Patient verbalized understanding of instructions that were given to them at the PAT appointment. Patient was also instructed that they will need to review over the PAT instructions again at home before surgery.

## 2020-01-05 NOTE — Progress Notes (Signed)
Sent you the pcr lab results on this patient in error.  Sorry.

## 2020-01-05 NOTE — Progress Notes (Signed)
Faxed PCR results 01/05/20 to Dr. Percell Miller via epic

## 2020-01-10 NOTE — Progress Notes (Signed)
Anesthesia Chart Review   Case: [id number] Date/Time: 01/16/20 0715   Procedure: TOTAL HIP ARTHROPLASTY ANTERIOR APPROACH (Left Hip)   Anesthesia type: Choice   Pre-op diagnosis: OA LEFT HIP   Location: [facility] ROOM 08 / [facility]   Surgeons: Renette Butters, MD      DISCUSSION:74 y.o. former smoker (quit 10/19/14) with h/o HTN, asthma, COPD, pre-diabetes, left hip OA scheduled for above procedure 01/16/2020 with Dr. Edmonia Lynch.   Clearance received from PCP, Dr. Jani Gravel.  Per clearance pt is optimized for surgery from a medical and cardiac standpoint.  Last A1C 5.9.    Anticipate pt can proceed with planned procedure barring acute status change.   VS: BP (!) 163/87   Pulse 74   Temp 36.8 C (Oral)   Resp (!) 6   Ht 5\' 3"  (1.6 m)   Wt 67.1 kg   SpO2 99%   BMI 26.22 kg/m   PROVIDERS: Jani Gravel, MD is PCP    LABS: Labs reviewed: Acceptable for surgery. (all labs ordered are listed, but only abnormal results are displayed)  Labs Reviewed  SURGICAL PCR SCREEN - Abnormal; Notable for the following components:      Result Value   MRSA, PCR POSITIVE (*)    Staphylococcus aureus POSITIVE (*)    All other components within normal limits  CBC - Abnormal; Notable for the following components:   RBC 3.57 (*)    Hemoglobin 11.1 (*)    MCV 102.5 (*)    All other components within normal limits  BASIC METABOLIC PANEL  URINALYSIS, ROUTINE W REFLEX MICROSCOPIC  HEMOGLOBIN A1C     IMAGES:   EKG: 10/12/2018 Rate 98bpm  Sinus rhythm  Ventricular premature complex   CV: Echo 09/23/2018 Study Conclusions   - Left ventricle: The cavity size was normal. Wall thickness was  normal. Systolic function was normal. The estimated ejection  fraction was in the range of 55% to 60%. Wall motion was normal;  there were no regional wall motion abnormalities. Doppler  parameters are consistent with abnormal left ventricular  relaxation (grade 1 diastolic dysfunction). Doppler  parameters  are consistent with high ventricular filling pressure.  - Aortic valve: There was trivial regurgitation.  - Mitral valve: Calcified annulus. Prolapse, involving the  posterior leaflet. There was mild to moderate regurgitation.  - Pulmonary arteries: PA peak pressure: 31 mm Hg (S).  Past Medical History:  Diagnosis Date  . Acid reflux   . Anemia   . Asthma   . COPD (chronic obstructive pulmonary disease) ([place])   . H/O polymyalgia rheumatica   . History of COVID-19 2020  . Hypertension   . OA (osteoarthritis)   . Obesity   . Osteoporosis   . Pre-diabetes   . Syncope   . Syncope and collapse 08/2018  . Vitamin D deficiency     Past Surgical History:  Procedure Laterality Date  . ABDOMINAL HYSTERECTOMY     TOTAL ABDOMINAL HYSTERECTOMY   . COLONOSCOPY W/ BIOPSIES  2015  . ESOPHAGOGASTRODUODENOSCOPY    . OOPHORECTOMY     BSO    MEDICATIONS: . acetaminophen (TYLENOL) 500 MG tablet  . amLODipine (NORVASC) 10 MG tablet  . aspirin EC 81 MG tablet  . bisoprolol-hydrochlorothiazide (ZIAC) 5-6.25 MG tablet  . cholecalciferol (VITAMIN D) 1000 UNITS tablet  . cyclobenzaprine (FLEXERIL) 10 MG tablet  . feeding supplement, ENSURE ENLIVE, (ENSURE ENLIVE) LIQD  . folic acid (FOLVITE) 1 MG tablet  . lisinopril (ZESTRIL) 40 MG  tablet  . methotrexate 2.5 MG tablet  . naproxen sodium (ALEVE) 220 MG tablet  . omeprazole (PRILOSEC) 20 MG capsule  . predniSONE (DELTASONE) 5 MG tablet   No current facility-administered medications for this encounter.    Maia Plan [facility] Pre-Surgical Testing [phone] 01/10/20  1:14 PM

## 2020-01-10 NOTE — Anesthesia Preprocedure Evaluation (Addendum)
Anesthesia Evaluation  Patient identified by MRN, date of birth, ID band Patient awake    Reviewed: Allergy & Precautions, NPO status , Patient's Chart, lab work & pertinent test results, reviewed documented beta blocker date and time   Airway Mallampati: II  TM Distance: >3 FB Neck ROM: Full    Dental  (+) Edentulous Upper, Edentulous Lower   Pulmonary shortness of breath and with exertion, asthma , COPD, former smoker,  H/o COVID positive   Pulmonary exam normal breath sounds clear to auscultation       Cardiovascular hypertension, Pt. on home beta blockers and Pt. on medications Normal cardiovascular exam+ Valvular Problems/Murmurs MR  Rhythm:Regular Rate:Normal  TTE 2020 - Normal LV systolic function; probable mild diastolic dysfunction; trace AI; eccentric, anteriorly directed MR (mild to moderate)suggestion prolapse of posterior MV leaflet  Event Monitor 2020 Sinus rhythm and sinus tachycardia Zero atrial fibrillation Shortness of breath associated with sinus rhythm   Neuro/Psych negative neurological ROS  negative psych ROS   GI/Hepatic Neg liver ROS, GERD  Medicated and Controlled,  Endo/Other  negative endocrine ROS  Renal/GU negative Renal ROS  negative genitourinary   Musculoskeletal  (+) Arthritis , Rheumatoid disorders,    Abdominal   Peds  Hematology  (+) Blood dyscrasia (Hgb 11.1), ,   Anesthesia Other Findings   Reproductive/Obstetrics                           Anesthesia Physical Anesthesia Plan  ASA: III  Anesthesia Plan: Spinal   Post-op Pain Management:    Induction:   PONV Risk Score and Plan: 2 and Treatment may vary due to age or medical condition, Ondansetron and Propofol infusion  Airway Management Planned: Natural Airway  Additional Equipment:   Intra-op Plan:   Post-operative Plan:   Informed Consent: I have reviewed the patients History and  Physical, chart, labs and discussed the procedure including the risks, benefits and alternatives for the proposed anesthesia with the patient or authorized representative who has indicated his/her understanding and acceptance.     Dental advisory given  Plan Discussed with: CRNA  Anesthesia Plan Comments:      Anesthesia Quick Evaluation

## 2020-01-12 ENCOUNTER — Other Ambulatory Visit (HOSPITAL_COMMUNITY)
Admission: RE | Admit: 2020-01-12 | Discharge: 2020-01-12 | Disposition: A | Discharge status: Home / Self Care | Payer: Medicare Other | Source: Ambulatory Visit | Attending: Orthopedic Surgery | Admitting: Orthopedic Surgery

## 2020-01-12 DIAGNOSIS — Z20822 Contact with and (suspected) exposure to covid-19: Secondary | ICD-10-CM | POA: Diagnosis not present

## 2020-01-12 DIAGNOSIS — Z01812 Encounter for preprocedural laboratory examination: Secondary | ICD-10-CM | POA: Insufficient documentation

## 2020-01-12 LAB — SARS CORONAVIRUS 2 (TAT 6-24 HRS): SARS Coronavirus 2: NEGATIVE

## 2020-01-12 NOTE — Care Plan (Signed)
Ortho Bundle Case Management Note  Patient Details  Name: Heather Ellis MRN: [id number] Date of Birth: [birth year]    Spoke with patient prior to surgery. She plans to discharge to home with family to assist. [facility] has equipment at home. HHPT referral to [facility] and OPPT set up with [facility]. Patient and MD in agreement with plan and Choice offered.                  DME Arranged:    DME Agency:     HH Arranged:  PT HH Agency:  [facility] ([facility])  Additional Comments: Please contact me with any questions of if this plan should need to change.  Ladell Heads,  [place] Specialist  [phone] 01/12/2020, 11:35 AM

## 2020-01-15 MED ORDER — BUPIVACAINE LIPOSOME 1.3 % IJ SUSP
10.0000 mL | Freq: Once | INTRAMUSCULAR | Status: DC
  Filled 2020-01-15: qty 10

## 2020-01-16 ENCOUNTER — Ambulatory Visit (HOSPITAL_COMMUNITY): Payer: Medicare Other | Admitting: Certified Registered Nurse Anesthetist

## 2020-01-16 ENCOUNTER — Observation Stay (HOSPITAL_COMMUNITY)
Admission: RE | Admit: 2020-01-16 | Discharge: 2020-01-17 | Disposition: A | Discharge status: Home Health | Payer: Medicare Other | Attending: Orthopedic Surgery | Admitting: Orthopedic Surgery

## 2020-01-16 ENCOUNTER — Other Ambulatory Visit: Payer: Self-pay

## 2020-01-16 ENCOUNTER — Ambulatory Visit (HOSPITAL_COMMUNITY): Payer: Medicare Other

## 2020-01-16 ENCOUNTER — Encounter (HOSPITAL_COMMUNITY)
Admission: RE | Disposition: A | Discharge status: Home Health | Payer: Self-pay | Source: Home / Self Care | Attending: Orthopedic Surgery

## 2020-01-16 ENCOUNTER — Ambulatory Visit (HOSPITAL_COMMUNITY): Payer: Medicare Other | Admitting: Physician Assistant

## 2020-01-16 ENCOUNTER — Encounter (HOSPITAL_COMMUNITY): Payer: Self-pay | Admitting: Orthopedic Surgery

## 2020-01-16 DIAGNOSIS — M1612 Unilateral primary osteoarthritis, left hip: Principal | ICD-10-CM | POA: Diagnosis present

## 2020-01-16 DIAGNOSIS — E559 Vitamin D deficiency, unspecified: Secondary | ICD-10-CM | POA: Insufficient documentation

## 2020-01-16 DIAGNOSIS — J449 Chronic obstructive pulmonary disease, unspecified: Secondary | ICD-10-CM | POA: Insufficient documentation

## 2020-01-16 DIAGNOSIS — Z79899 Other long term (current) drug therapy: Secondary | ICD-10-CM | POA: Diagnosis not present

## 2020-01-16 DIAGNOSIS — Z8616 Personal history of COVID-19: Secondary | ICD-10-CM | POA: Insufficient documentation

## 2020-01-16 DIAGNOSIS — Z7982 Long term (current) use of aspirin: Secondary | ICD-10-CM | POA: Insufficient documentation

## 2020-01-16 DIAGNOSIS — J45909 Unspecified asthma, uncomplicated: Secondary | ICD-10-CM | POA: Diagnosis present

## 2020-01-16 DIAGNOSIS — Z6826 Body mass index (BMI) 26.0-26.9, adult: Secondary | ICD-10-CM | POA: Diagnosis not present

## 2020-01-16 DIAGNOSIS — Z87891 Personal history of nicotine dependence: Secondary | ICD-10-CM | POA: Diagnosis not present

## 2020-01-16 DIAGNOSIS — K219 Gastro-esophageal reflux disease without esophagitis: Secondary | ICD-10-CM | POA: Diagnosis not present

## 2020-01-16 DIAGNOSIS — Z96642 Presence of left artificial hip joint: Secondary | ICD-10-CM | POA: Diagnosis not present

## 2020-01-16 DIAGNOSIS — I10 Essential (primary) hypertension: Secondary | ICD-10-CM | POA: Diagnosis not present

## 2020-01-16 DIAGNOSIS — Z471 Aftercare following joint replacement surgery: Secondary | ICD-10-CM | POA: Diagnosis not present

## 2020-01-16 DIAGNOSIS — Z7952 Long term (current) use of systemic steroids: Secondary | ICD-10-CM | POA: Insufficient documentation

## 2020-01-16 DIAGNOSIS — Z419 Encounter for procedure for purposes other than remedying health state, unspecified: Secondary | ICD-10-CM

## 2020-01-16 DIAGNOSIS — M1610 Unilateral primary osteoarthritis, unspecified hip: Secondary | ICD-10-CM | POA: Diagnosis present

## 2020-01-16 DIAGNOSIS — E669 Obesity, unspecified: Secondary | ICD-10-CM | POA: Insufficient documentation

## 2020-01-16 DIAGNOSIS — M810 Age-related osteoporosis without current pathological fracture: Secondary | ICD-10-CM | POA: Diagnosis not present

## 2020-01-16 HISTORY — PX: TOTAL HIP ARTHROPLASTY: SHX124

## 2020-01-16 LAB — TYPE AND SCREEN
ABO/RH(D): A POS
Antibody Screen: NEGATIVE

## 2020-01-16 LAB — ABO/RH: ABO/RH(D): A POS

## 2020-01-16 SURGERY — ARTHROPLASTY, HIP, TOTAL, ANTERIOR APPROACH
Anesthesia: Spinal | Site: Hip | Laterality: Left

## 2020-01-16 MED ORDER — LACTATED RINGERS IV SOLN: INTRAVENOUS | Status: DC

## 2020-01-16 MED ORDER — METOCLOPRAMIDE HCL 5 MG PO TABS: 5.0000 mg | ORAL_TABLET | Freq: Three times a day (TID) | ORAL | Status: DC | PRN

## 2020-01-16 MED ORDER — ONDANSETRON HCL 4 MG PO TABS: 4.0000 mg | ORAL_TABLET | Freq: Three times a day (TID) | ORAL | 0 refills | Status: DC | PRN

## 2020-01-16 MED ORDER — PHENYLEPHRINE HCL-NACL 10-0.9 MG/250ML-% IV SOLN
INTRAVENOUS | Status: DC | PRN
  Administered 2020-01-16: 25 ug/min via INTRAVENOUS

## 2020-01-16 MED ORDER — CHLORHEXIDINE GLUCONATE CLOTH 2 % EX PADS: 6.0000 | MEDICATED_PAD | Freq: Every day | CUTANEOUS | Status: DC

## 2020-01-16 MED ORDER — CEFAZOLIN SODIUM-DEXTROSE 2-4 GM/100ML-% IV SOLN
2.0000 g | INTRAVENOUS | Status: AC
  Administered 2020-01-16: 2 g via INTRAVENOUS
  Filled 2020-01-16: qty 100

## 2020-01-16 MED ORDER — BUPIVACAINE LIPOSOME 1.3 % IJ SUSP
INTRAMUSCULAR | Status: DC | PRN
  Administered 2020-01-16: 20 mL

## 2020-01-16 MED ORDER — PROPOFOL 10 MG/ML IV BOLUS
INTRAVENOUS | Status: DC | PRN
  Administered 2020-01-16: 20 mg via INTRAVENOUS

## 2020-01-16 MED ORDER — FOLIC ACID 1 MG PO TABS
1.0000 mg | ORAL_TABLET | Freq: Every day | ORAL | Status: DC
  Administered 2020-01-16 – 2020-01-17 (×2): 1 mg via ORAL
  Filled 2020-01-16 (×2): qty 1

## 2020-01-16 MED ORDER — FENTANYL CITRATE (PF) 100 MCG/2ML IJ SOLN: 25.0000 ug | INTRAMUSCULAR | Status: DC | PRN

## 2020-01-16 MED ORDER — PREDNISONE 5 MG PO TABS
10.0000 mg | ORAL_TABLET | Freq: Every day | ORAL | Status: DC
  Administered 2020-01-17: 10 mg via ORAL
  Filled 2020-01-16: qty 2

## 2020-01-16 MED ORDER — DEXAMETHASONE SODIUM PHOSPHATE 10 MG/ML IJ SOLN
INTRAMUSCULAR | Status: AC
  Filled 2020-01-16: qty 1

## 2020-01-16 MED ORDER — PROPOFOL 500 MG/50ML IV EMUL
INTRAVENOUS | Status: DC | PRN
  Administered 2020-01-16: 50 ug/kg/min via INTRAVENOUS

## 2020-01-16 MED ORDER — SODIUM CHLORIDE 0.9% FLUSH: 10.0000 mL | INTRAVENOUS | Status: DC | PRN

## 2020-01-16 MED ORDER — MENTHOL 3 MG MT LOZG: 1.0000 | LOZENGE | OROMUCOSAL | Status: DC | PRN

## 2020-01-16 MED ORDER — VANCOMYCIN HCL IN DEXTROSE 1-5 GM/200ML-% IV SOLN: 1000.0000 mg | Freq: Once | INTRAVENOUS | Status: DC

## 2020-01-16 MED ORDER — MORPHINE SULFATE (PF) 2 MG/ML IV SOLN: 0.5000 mg | INTRAVENOUS | Status: DC | PRN

## 2020-01-16 MED ORDER — METHOCARBAMOL 500 MG IVPB - SIMPLE MED
500.0000 mg | Freq: Four times a day (QID) | INTRAVENOUS | Status: DC | PRN
  Filled 2020-01-16: qty 50

## 2020-01-16 MED ORDER — ACETAMINOPHEN 325 MG PO TABS: 325.0000 mg | ORAL_TABLET | Freq: Four times a day (QID) | ORAL | Status: DC | PRN

## 2020-01-16 MED ORDER — TRANEXAMIC ACID-NACL 1000-0.7 MG/100ML-% IV SOLN
1000.0000 mg | INTRAVENOUS | Status: AC
  Administered 2020-01-16: 1000 mg via INTRAVENOUS
  Filled 2020-01-16: qty 100

## 2020-01-16 MED ORDER — PHENOL 1.4 % MT LIQD: 1.0000 | OROMUCOSAL | Status: DC | PRN

## 2020-01-16 MED ORDER — EPHEDRINE 5 MG/ML INJ
INTRAVENOUS | Status: AC
  Filled 2020-01-16: qty 10

## 2020-01-16 MED ORDER — DIPHENHYDRAMINE HCL 12.5 MG/5ML PO ELIX: 12.5000 mg | ORAL_SOLUTION | ORAL | Status: DC | PRN

## 2020-01-16 MED ORDER — ENSURE ENLIVE PO LIQD: 237.0000 mL | Freq: Two times a day (BID) | ORAL | Status: DC

## 2020-01-16 MED ORDER — WATER FOR IRRIGATION, STERILE IR SOLN
Status: DC | PRN
  Administered 2020-01-16: 2000 mL

## 2020-01-16 MED ORDER — PROPOFOL 10 MG/ML IV BOLUS
INTRAVENOUS | Status: AC
  Filled 2020-01-16: qty 40

## 2020-01-16 MED ORDER — SODIUM CHLORIDE FLUSH 0.9 % IV SOLN
INTRAVENOUS | Status: DC | PRN
  Administered 2020-01-16: 20 mL

## 2020-01-16 MED ORDER — BUPIVACAINE IN DEXTROSE 0.75-8.25 % IT SOLN
INTRATHECAL | Status: DC | PRN
  Administered 2020-01-16: 1.8 mL via INTRATHECAL

## 2020-01-16 MED ORDER — HYDROCODONE-ACETAMINOPHEN 5-325 MG PO TABS
1.0000 | ORAL_TABLET | Freq: Four times a day (QID) | ORAL | Status: DC | PRN
  Administered 2020-01-16 – 2020-01-17 (×2): 1 via ORAL
  Filled 2020-01-16 (×2): qty 1

## 2020-01-16 MED ORDER — HYDROCODONE-ACETAMINOPHEN 5-325 MG PO TABS: 1.0000 | ORAL_TABLET | Freq: Four times a day (QID) | ORAL | 0 refills | Status: DC | PRN

## 2020-01-16 MED ORDER — PANTOPRAZOLE SODIUM 40 MG PO TBEC
40.0000 mg | DELAYED_RELEASE_TABLET | Freq: Every day | ORAL | Status: DC
  Administered 2020-01-16 – 2020-01-17 (×2): 40 mg via ORAL
  Filled 2020-01-16 (×2): qty 1

## 2020-01-16 MED ORDER — POLYETHYLENE GLYCOL 3350 17 G PO PACK: 17.0000 g | PACK | Freq: Every day | ORAL | Status: DC | PRN

## 2020-01-16 MED ORDER — FENTANYL CITRATE (PF) 100 MCG/2ML IJ SOLN
INTRAMUSCULAR | Status: DC | PRN
  Administered 2020-01-16: 50 ug via INTRAVENOUS

## 2020-01-16 MED ORDER — TRANEXAMIC ACID-NACL 1000-0.7 MG/100ML-% IV SOLN: 1000.0000 mg | Freq: Once | INTRAVENOUS | Status: DC

## 2020-01-16 MED ORDER — VANCOMYCIN HCL IN DEXTROSE 1-5 GM/200ML-% IV SOLN
1000.0000 mg | Freq: Once | INTRAVENOUS | Status: AC
  Administered 2020-01-16: 1000 mg via INTRAVENOUS
  Filled 2020-01-16: qty 200

## 2020-01-16 MED ORDER — ACETAMINOPHEN 500 MG PO TABS
1000.0000 mg | ORAL_TABLET | Freq: Once | ORAL | Status: AC
  Administered 2020-01-16: 1000 mg via ORAL
  Filled 2020-01-16: qty 2

## 2020-01-16 MED ORDER — PROPOFOL 1000 MG/100ML IV EMUL
INTRAVENOUS | Status: AC
  Filled 2020-01-16: qty 100

## 2020-01-16 MED ORDER — MAGNESIUM CITRATE PO SOLN: 1.0000 | Freq: Once | ORAL | Status: DC | PRN

## 2020-01-16 MED ORDER — DOCUSATE SODIUM 100 MG PO CAPS
100.0000 mg | ORAL_CAPSULE | Freq: Two times a day (BID) | ORAL | 0 refills | Status: DC
Start: 2020-01-16 — End: 2021-04-04

## 2020-01-16 MED ORDER — ACETAMINOPHEN 500 MG PO TABS
500.0000 mg | ORAL_TABLET | Freq: Four times a day (QID) | ORAL | Status: DC
  Administered 2020-01-16 – 2020-01-17 (×2): 500 mg via ORAL
  Filled 2020-01-16 (×3): qty 1

## 2020-01-16 MED ORDER — METHOCARBAMOL 500 MG PO TABS
500.0000 mg | ORAL_TABLET | Freq: Four times a day (QID) | ORAL | Status: DC | PRN
  Administered 2020-01-16 – 2020-01-17 (×2): 500 mg via ORAL
  Filled 2020-01-16 (×2): qty 1

## 2020-01-16 MED ORDER — METOCLOPRAMIDE HCL 5 MG/ML IJ SOLN: 5.0000 mg | Freq: Three times a day (TID) | INTRAMUSCULAR | Status: DC | PRN

## 2020-01-16 MED ORDER — ONDANSETRON HCL 4 MG/2ML IJ SOLN
INTRAMUSCULAR | Status: AC
  Filled 2020-01-16: qty 2

## 2020-01-16 MED ORDER — ONDANSETRON HCL 4 MG/2ML IJ SOLN
INTRAMUSCULAR | Status: DC | PRN
  Administered 2020-01-16: 4 mg via INTRAVENOUS

## 2020-01-16 MED ORDER — 0.9 % SODIUM CHLORIDE (POUR BTL) OPTIME
TOPICAL | Status: DC | PRN
  Administered 2020-01-16: 1000 mL

## 2020-01-16 MED ORDER — POVIDONE-IODINE 10 % EX SWAB
2.0000 "application " | Freq: Once | CUTANEOUS | Status: AC
  Administered 2020-01-16: 2 via TOPICAL

## 2020-01-16 MED ORDER — ASPIRIN EC 81 MG PO TBEC
81.0000 mg | DELAYED_RELEASE_TABLET | Freq: Two times a day (BID) | ORAL | 0 refills | Status: DC
Start: 2020-01-16 — End: 2021-10-23

## 2020-01-16 MED ORDER — FENTANYL CITRATE (PF) 100 MCG/2ML IJ SOLN
INTRAMUSCULAR | Status: AC
  Filled 2020-01-16: qty 2

## 2020-01-16 MED ORDER — CELECOXIB 100 MG PO CAPS
100.0000 mg | ORAL_CAPSULE | Freq: Two times a day (BID) | ORAL | Status: DC
  Administered 2020-01-16 – 2020-01-17 (×2): 100 mg via ORAL
  Filled 2020-01-16 (×2): qty 1

## 2020-01-16 MED ORDER — CHLORHEXIDINE GLUCONATE 0.12 % MT SOLN
15.0000 mL | Freq: Once | OROMUCOSAL | Status: AC
  Administered 2020-01-16: 15 mL via OROMUCOSAL

## 2020-01-16 MED ORDER — DOCUSATE SODIUM 100 MG PO CAPS
100.0000 mg | ORAL_CAPSULE | Freq: Two times a day (BID) | ORAL | Status: DC
  Administered 2020-01-16 – 2020-01-17 (×3): 100 mg via ORAL
  Filled 2020-01-16 (×3): qty 1

## 2020-01-16 MED ORDER — EPHEDRINE SULFATE-NACL 50-0.9 MG/10ML-% IV SOSY
PREFILLED_SYRINGE | INTRAVENOUS | Status: DC | PRN
  Administered 2020-01-16: 10 mg via INTRAVENOUS

## 2020-01-16 MED ORDER — ONDANSETRON HCL 4 MG PO TABS: 4.0000 mg | ORAL_TABLET | Freq: Four times a day (QID) | ORAL | Status: DC | PRN

## 2020-01-16 MED ORDER — ORAL CARE MOUTH RINSE: 15.0000 mL | Freq: Once | OROMUCOSAL | Status: AC

## 2020-01-16 MED ORDER — SODIUM CHLORIDE (PF) 0.9 % IJ SOLN
INTRAMUSCULAR | Status: AC
  Filled 2020-01-16: qty 20

## 2020-01-16 MED ORDER — AMLODIPINE BESYLATE 10 MG PO TABS
10.0000 mg | ORAL_TABLET | Freq: Every day | ORAL | Status: DC
  Administered 2020-01-16 – 2020-01-17 (×2): 10 mg via ORAL
  Filled 2020-01-16 (×2): qty 1

## 2020-01-16 MED ORDER — ONDANSETRON HCL 4 MG/2ML IJ SOLN: 4.0000 mg | Freq: Four times a day (QID) | INTRAMUSCULAR | Status: DC | PRN

## 2020-01-16 MED ORDER — CEFAZOLIN SODIUM-DEXTROSE 1-4 GM/50ML-% IV SOLN
1.0000 g | Freq: Four times a day (QID) | INTRAVENOUS | Status: AC
  Administered 2020-01-16 (×2): 1 g via INTRAVENOUS
  Filled 2020-01-16 (×3): qty 50

## 2020-01-16 MED ORDER — CELECOXIB 100 MG PO CAPS: 100.0000 mg | ORAL_CAPSULE | Freq: Two times a day (BID) | ORAL | 0 refills | Status: AC

## 2020-01-16 MED ORDER — ASPIRIN 81 MG PO CHEW
81.0000 mg | CHEWABLE_TABLET | Freq: Two times a day (BID) | ORAL | Status: DC
  Administered 2020-01-16 – 2020-01-17 (×2): 81 mg via ORAL
  Filled 2020-01-16 (×2): qty 1

## 2020-01-16 MED ORDER — DEXAMETHASONE SODIUM PHOSPHATE 10 MG/ML IJ SOLN
INTRAMUSCULAR | Status: DC | PRN
Start: 2020-01-16 — End: 2020-01-16
  Administered 2020-01-16: 10 mg via INTRAVENOUS

## 2020-01-16 MED ORDER — SORBITOL 70 % SOLN
30.0000 mL | Freq: Every day | Status: DC | PRN
  Filled 2020-01-16: qty 30

## 2020-01-16 MED ORDER — BISOPROLOL-HYDROCHLOROTHIAZIDE 5-6.25 MG PO TABS
2.0000 | ORAL_TABLET | Freq: Every day | ORAL | Status: DC
  Administered 2020-01-16: 2 via ORAL
  Filled 2020-01-16: qty 2

## 2020-01-16 SURGICAL SUPPLY — 44 items
APL PRP STRL LF DISP 70% ISPRP (MISCELLANEOUS) ×1
BAG SPEC THK2 15X12 ZIP CLS (MISCELLANEOUS)
BAG ZIPLOCK 12X15 (MISCELLANEOUS) IMPLANT
BLADE SAG 18X100X1.27 (BLADE) ×2 IMPLANT
BLADE SURG SZ10 CARB STEEL (BLADE) ×4 IMPLANT
CHLORAPREP W/TINT 26 (MISCELLANEOUS) ×2 IMPLANT
CLSR STERI-STRIP ANTIMIC 1/2X4 (GAUZE/BANDAGES/DRESSINGS) ×2 IMPLANT
COVER PERINEAL POST (MISCELLANEOUS) ×2 IMPLANT
COVER SURGICAL LIGHT HANDLE (MISCELLANEOUS) ×2 IMPLANT
COVER WAND RF STERILE (DRAPES) IMPLANT
DECANTER SPIKE VIAL GLASS SM (MISCELLANEOUS) ×4 IMPLANT
DRAPE IMP U-DRAPE 54X76 (DRAPES) ×2 IMPLANT
DRAPE STERI IOBAN 125X83 (DRAPES) ×2 IMPLANT
DRAPE U-SHAPE 47X51 STRL (DRAPES) ×4 IMPLANT
DRSG MEPILEX BORDER 4X8 (GAUZE/BANDAGES/DRESSINGS) ×2 IMPLANT
ELECT BLADE TIP CTD 4 INCH (ELECTRODE) ×2 IMPLANT
ELECT REM PT RETURN 15FT ADLT (MISCELLANEOUS) ×2 IMPLANT
GLOVE BIO SURGEON STRL SZ7.5 (GLOVE) ×4 IMPLANT
GLOVE BIOGEL PI IND STRL 8 (GLOVE) ×2 IMPLANT
GLOVE BIOGEL PI INDICATOR 8 (GLOVE) ×2
GOWN STRL REUS W/TWL LRG LVL3 (GOWN DISPOSABLE) ×2 IMPLANT
GOWN STRL REUS W/TWL XL LVL3 (GOWN DISPOSABLE) ×2 IMPLANT
HEAD BIOLOX HIP 36/-5 (Joint) IMPLANT
HIP BIOLOX HD 36/-5 (Joint) ×2 IMPLANT
HOLDER FOLEY CATH W/STRAP (MISCELLANEOUS) IMPLANT
INSERT POLYETHYLENE 36M-0 (Insert) ×1 IMPLANT
KIT TURNOVER KIT A (KITS) IMPLANT
MANIFOLD NEPTUNE II (INSTRUMENTS) ×2 IMPLANT
NS IRRIG 1000ML POUR BTL (IV SOLUTION) ×2 IMPLANT
PACK ANTERIOR HIP CUSTOM (KITS) ×2 IMPLANT
PROTECTOR NERVE ULNAR (MISCELLANEOUS) ×1 IMPLANT
SCREW HEX LP 6.5X20 (Screw) ×1 IMPLANT
SHELL ACETAB TRIDENT 48 (Shell) ×1 IMPLANT
STEM HIP 4 127DEG (Stem) ×1 IMPLANT
SUT MNCRL AB 4-0 PS2 18 (SUTURE) ×2 IMPLANT
SUT STRATAFIX 0 PDS 27 VIOLET (SUTURE) ×2
SUT VIC AB 0 CT1 36 (SUTURE) ×2 IMPLANT
SUT VIC AB 1 CT1 36 (SUTURE) ×2 IMPLANT
SUT VIC AB 2-0 CT1 27 (SUTURE) ×4
SUT VIC AB 2-0 CT1 TAPERPNT 27 (SUTURE) ×2 IMPLANT
SUTURE STRATFX 0 PDS 27 VIOLET (SUTURE) ×1 IMPLANT
TRAY FOLEY MTR SLVR 14FR STAT (SET/KITS/TRAYS/PACK) ×1 IMPLANT
WATER STERILE IRR 1000ML POUR (IV SOLUTION) ×4 IMPLANT
YANKAUER SUCT BULB TIP 10FT TU (MISCELLANEOUS) ×2 IMPLANT

## 2020-01-16 NOTE — Plan of Care (Signed)

## 2020-01-16 NOTE — Anesthesia Procedure Notes (Signed)
Procedure Name: MAC Date/Time: 01/16/2020 7:25 AM Performed by: West Pugh, CRNA Pre-anesthesia Checklist: Patient identified, Emergency Drugs available, Suction available, Patient being monitored and Timeout performed Patient Re-evaluated:Patient Re-evaluated prior to induction Oxygen Delivery Method: Simple face mask Preoxygenation: Pre-oxygenation with 100% oxygen Induction Type: IV induction Placement Confirmation: positive ETCO2 Dental Injury: Teeth and Oropharynx as per pre-operative assessment

## 2020-01-16 NOTE — Discharge Instructions (Signed)

## 2020-01-16 NOTE — Evaluation (Signed)
Physical Therapy Evaluation Patient Details Name: Heather Ellis MRN: [id number] DOB: May 23, 1946 Today's Date: 01/16/2020   History of Present Illness  s/p L DA THA  Clinical Impression  Pt is s/p THA resulting in the deficits listed below (see PT Problem List).  Pt amb ~ 85' with RW and min assist. Anticipate steady progress in acute setting.   Pt will benefit from skilled PT to increase their independence and safety with mobility to allow discharge to the venue listed below.      Follow Up Recommendations Follow surgeon's recommendation for DC plan and follow-up therapies    Equipment Recommendations  None recommended by PT    Recommendations for Other Services       Precautions / Restrictions Precautions Precautions: Fall Restrictions Weight Bearing Restrictions: No Other Position/Activity Restrictions: WBAT      Mobility  Bed Mobility Overal bed mobility: Needs Assistance Bed Mobility: Supine to Sit     Supine to sit: Min assist     General bed mobility comments: assist with LL E  Transfers Overall transfer level: Needs assistance Equipment used: Rolling walker (2 wheeled) Transfers: Sit to/from Stand Sit to Stand: Min assist         General transfer comment: cues for hand placement  Ambulation/Gait Ambulation/Gait assistance: Min assist Gait Distance (Feet): 50 Feet Assistive device: Rolling walker (2 wheeled) Gait Pattern/deviations: Step-to pattern;Decreased stance time - left     General Gait Details: cues for sequence and RW position  Stairs            Wheelchair Mobility    Modified Rankin (Stroke Patients Only)       Balance                                             Pertinent Vitals/Pain Pain Assessment: 0-10 Pain Score: 4  Pain Location: left hip Pain Descriptors / Indicators: Aching;Sore;Grimacing Pain Intervention(s): Premedicated before session;Monitored during session;Limited activity within  patient's tolerance;Repositioned;Ice applied    Home Living Family/patient expects to be discharged to:: Private residence Living Arrangements: Children Available Help at Discharge: Family Type of Home: House Home Access: Stairs to enter   Technical brewer of Steps: 1 Home Layout: One level Home Equipment: Environmental consultant - 2 wheels;Cane - single point      Prior Function Level of Independence: Independent               Hand Dominance        Extremity/Trunk Assessment   Upper Extremity Assessment Upper Extremity Assessment: Overall WFL for tasks assessed    Lower Extremity Assessment Lower Extremity Assessment: LLE deficits/detail LLE Deficits / Details: ankle WFL, knee and hip 2+/5, limited by post op pain and weakness       Communication   Communication: No difficulties  Cognition Arousal/Alertness: Awake/alert Behavior During Therapy: WFL for tasks assessed/performed Overall Cognitive Status: Within Functional Limits for tasks assessed                                        General Comments      Exercises Total Joint Exercises Ankle Circles/Pumps: AROM;Both;10 reps Quad Sets: AROM;Both;10 reps   Assessment/Plan    PT Assessment Patient needs continued PT services  PT Problem List Decreased strength;Decreased range of motion;Decreased activity  tolerance;Decreased knowledge of use of DME;Decreased mobility;Pain       PT Treatment Interventions DME instruction;Gait training;Functional mobility training;Therapeutic activities;Patient/family education;Therapeutic exercise;Stair training    PT Goals (Current goals can be found in the Care Plan section)  Acute Rehab PT Goals Patient Stated Goal: home PT Goal Formulation: With patient Time For Goal Achievement: 01/23/20 Potential to Achieve Goals: Good    Frequency 7X/week   Barriers to discharge        Co-evaluation               AM-PAC PT "6 Clicks" Mobility  Outcome  Measure Help needed turning from your back to your side while in a flat bed without using bedrails?: A Little Help needed moving from lying on your back to sitting on the side of a flat bed without using bedrails?: A Little Help needed moving to and from a bed to a chair (including a wheelchair)?: A Little Help needed standing up from a chair using your arms (e.g., wheelchair or bedside chair)?: A Little Help needed to walk in hospital room?: A Little Help needed climbing 3-5 steps with a railing? : A Little 6 Click Score: 18    End of Session Equipment Utilized During Treatment: Gait belt Activity Tolerance: Patient tolerated treatment well Patient left: in chair;with call bell/phone within reach;with chair alarm set Nurse Communication: Mobility status PT Visit Diagnosis: Difficulty in walking, not elsewhere classified (R26.2)    Time: 0600-4599 PT Time Calculation (min) (ACUTE ONLY): 16 min   Charges:   PT Evaluation $PT Eval Low Complexity: [place], PT   Acute Rehab Dept [facility]): [phone]   01/16/2020   [facility] 01/16/2020, 3:27 PM

## 2020-01-16 NOTE — Transfer of Care (Signed)
Immediate Anesthesia Transfer of Care Note  Patient: MICHAELA BROSKI  Procedure(s) Performed: TOTAL HIP ARTHROPLASTY ANTERIOR APPROACH (Left Hip)  Patient Location: PACU  Anesthesia Type:MAC and Spinal  Level of Consciousness: awake, alert , oriented and patient cooperative  Airway & Oxygen Therapy: Patient Spontanous Breathing and Patient connected to face mask oxygen  Post-op Assessment: Report given to RN and Post -op Vital signs reviewed and stable  Post vital signs: Reviewed and stable  Last Vitals:  Vitals Value Taken Time  BP 106/64 01/16/20 0931  Temp    Pulse 62 01/16/20 0934  Resp 19 01/16/20 0934  SpO2 100 % 01/16/20 0934  Vitals shown include unvalidated device data.  Last Pain:  Vitals:   01/16/20 0559  TempSrc: Oral         Complications: No apparent anesthesia complications

## 2020-01-16 NOTE — Plan of Care (Signed)
  Problem: Activity: Goal: Ability to avoid complications of mobility impairment will improve Outcome: Progressing   Problem: Skin Integrity: Goal: Will show signs of wound healing Outcome: Progressing   Problem: Pain Management: Goal: Pain level will decrease with appropriate interventions Outcome: Progressing   Problem: Education: Goal: Knowledge of General Education information will improve Description: Including pain rating scale, medication(s)/side effects and non-pharmacologic comfort measures Outcome: Progressing

## 2020-01-16 NOTE — Op Note (Signed)
01/16/2020  8:54 AM  PATIENT:  Heather Ellis   MRN: [id number]  PRE-OPERATIVE DIAGNOSIS:  OSTEOARTHRITIS LEFT HIP  POST-OPERATIVE DIAGNOSIS:  OSTEOARTHRITIS LEFT HIP  PROCEDURE:  Procedure(s): TOTAL HIP ARTHROPLASTY ANTERIOR APPROACH  PREOPERATIVE INDICATIONS:    Heather Ellis is an 74 y.o. female who has a diagnosis of Primary osteoarthritis of left hip and elected for surgical management after failing conservative treatment.  The risks benefits and alternatives were discussed with the patient including but not limited to the risks of nonoperative treatment, versus surgical intervention including infection, bleeding, nerve injury, periprosthetic fracture, the need for revision surgery, dislocation, leg length discrepancy, blood clots, cardiopulmonary complications, morbidity, mortality, among others, and they were willing to proceed.     OPERATIVE REPORT     SURGEON:   Renette Butters, MD    ASSISTANT:  Roxan Hockey, PA-C, he was present and scrubbed throughout the case, critical for completion in a timely fashion, and for retraction, instrumentation, and closure.     ANESTHESIA:  General    COMPLICATIONS:  None.     COMPONENTS:  [employer]ker acolade fit femur size 4 with a 36 mm -5 head ball and an acetabular shell size 48 with a  polyethylene liner    PROCEDURE IN DETAIL:   The patient was met in the holding area and  identified.  The appropriate hip was identified and marked at the operative site.  The patient was then transported to the OR  and  placed under anesthesia per that record.  At that point, the patient was  placed in the supine position and  secured to the operating room table and all bony prominences padded. He received pre-operative antibiotics    The operative lower extremity was prepped from the iliac crest to the distal leg.  Sterile draping was performed.  Time out was performed prior to incision.      Skin incision was made just 2 cm lateral to the  ASIS  extending in line with the tensor fascia lata. Electrocautery was used to control all bleeders. I dissected down sharply to the fascia of the tensor fascia lata was confirmed that the muscle fibers beneath were running posteriorly. I then incised the fascia over the superficial tensor fascia lata in line with the incision. The fascia was elevated off the anterior aspect of the muscle the muscle was retracted posteriorly and protected throughout the case. I then used electrocautery to incise the tensor fascia lata fascia control and all bleeders. Immediately visible was the fat over top of the anterior neck and capsule.  I removed the anterior fat from the capsule and elevated the rectus muscle off of the anterior capsule. I then removed a large time of capsule. The retractors were then placed over the anterior acetabulum as well as around the superior and inferior neck.  I then made a femoral neck cut. Then used the power corkscrew to remove the femoral head from the acetabulum and thoroughly irrigated the acetabulum. I sized the femoral head.    I then exposed the deep acetabulum, cleared out any tissue including the ligamentum teres.   After adequate visualization, I excised the labrum, and then sequentially reamed.  I then impacted the acetabular implant into place using fluoroscopy for guidance.  Appropriate version and inclination was confirmed clinically matching their bony anatomy, and with fluoroscopy.  I placed a 20 mm screw in the posterior/superio position with an excellent bite.    I then placed the polyethylene liner  in place  I then adducted the leg and released the external rotators from the posterior femur allowing it to be easily delivered up lateral and anterior to the acetabulum for preparation of the femoral canal.    I then prepared the proximal femur using the cookie-cutter and then sequentially reamed and broached.  A trial broach, neck, and head was utilized, and I  reduced the hip and used floroscopy to assess the neck length and femoral implant.  I then impacted the femoral prosthesis into place into the appropriate version. The hip was then reduced and fluoroscopy confirmed appropriate position. Leg lengths were restored.  I then irrigated the hip copiously again with, and repaired the fascia with Vicryl, followed by monocryl for the subcutaneous tissue, Monocryl for the skin, Steri-Strips and sterile gauze. The patient was then awakened and returned to PACU in stable and satisfactory condition. There were no complications.  POST OPERATIVE PLAN: WBAT, DVT px: SCD's/TED, ambulation and chemical dvt px  Edmonia Lynch, MD Orthopedic Surgeon [phone]

## 2020-01-16 NOTE — Anesthesia Procedure Notes (Signed)
Spinal  Patient location during procedure: OR Start time: 01/16/2020 7:26 AM End time: 01/16/2020 7:34 AM Reason for block: at surgeon's request Staffing Performed: resident/CRNA  Anesthesiologist: Freddrick March, MD Resident/CRNA: West Pugh, CRNA Preanesthetic Checklist Completed: patient identified, IV checked, site marked, risks and benefits discussed, surgical consent, monitors and equipment checked, pre-op evaluation and timeout performed Spinal Block Patient position: sitting Prep: DuraPrep Patient monitoring: heart rate, continuous pulse ox and blood pressure Approach: midline Location: L3-4 Injection technique: single-shot Needle Needle type: Pencan  Needle gauge: 24 G Needle length: 10 cm Assessment Sensory level: T6 Additional Notes Expiration of kit checked and confirmed. Patient tolerated procedure well,without complications x 1 attempt with noted clear CSF. Loss of motor and sensory on exam post injection. Dr Lanetta Inch present for procedure.

## 2020-01-16 NOTE — Anesthesia Postprocedure Evaluation (Signed)
Anesthesia Post Note  Patient: Heather Ellis  Procedure(s) Performed: TOTAL HIP ARTHROPLASTY ANTERIOR APPROACH (Left Hip)     Patient location during evaluation: PACU Anesthesia Type: Spinal Level of consciousness: awake and alert Pain management: pain level controlled Vital Signs Assessment: post-procedure vital signs reviewed and stable Respiratory status: spontaneous breathing, nonlabored ventilation, respiratory function stable and patient connected to nasal cannula oxygen Cardiovascular status: blood pressure returned to baseline and stable Postop Assessment: no apparent nausea or vomiting, spinal receding, no headache and no backache Anesthetic complications: no    Last Vitals:  Vitals:   01/16/20 1000 01/16/20 1015  BP: 119/71 124/69  Pulse: (!) 59 (!) 55  Resp: 16 15  Temp:  36.5 C  SpO2: 100% 100%    Last Pain:  Vitals:   01/16/20 1015  TempSrc:   PainSc: 0-No pain                 Frayda Egley L Guerry [place]

## 2020-01-16 NOTE — Interval H&P Note (Signed)
I participated in the care of this patient and agree with the above history, physical and evaluation. I performed a review of the history and a physical exam as detailed   Heidi Maclin Daniel Eryanna Regal MD  

## 2020-01-16 NOTE — Addendum Note (Signed)
Addendum  created 01/16/20 1042 by West Pugh, CRNA   Flowsheet accepted, Intraprocedure Flowsheets edited

## 2020-01-17 ENCOUNTER — Encounter: Payer: Self-pay | Admitting: *Deleted

## 2020-01-17 DIAGNOSIS — M810 Age-related osteoporosis without current pathological fracture: Secondary | ICD-10-CM | POA: Diagnosis not present

## 2020-01-17 DIAGNOSIS — Z7982 Long term (current) use of aspirin: Secondary | ICD-10-CM | POA: Diagnosis not present

## 2020-01-17 DIAGNOSIS — M1612 Unilateral primary osteoarthritis, left hip: Secondary | ICD-10-CM | POA: Diagnosis not present

## 2020-01-17 DIAGNOSIS — Z87891 Personal history of nicotine dependence: Secondary | ICD-10-CM | POA: Diagnosis not present

## 2020-01-17 DIAGNOSIS — Z8616 Personal history of COVID-19: Secondary | ICD-10-CM | POA: Diagnosis not present

## 2020-01-17 DIAGNOSIS — Z7952 Long term (current) use of systemic steroids: Secondary | ICD-10-CM | POA: Diagnosis not present

## 2020-01-17 DIAGNOSIS — J449 Chronic obstructive pulmonary disease, unspecified: Secondary | ICD-10-CM | POA: Diagnosis not present

## 2020-01-17 DIAGNOSIS — Z96642 Presence of left artificial hip joint: Secondary | ICD-10-CM | POA: Diagnosis not present

## 2020-01-17 DIAGNOSIS — K219 Gastro-esophageal reflux disease without esophagitis: Secondary | ICD-10-CM | POA: Diagnosis not present

## 2020-01-17 DIAGNOSIS — Z79899 Other long term (current) drug therapy: Secondary | ICD-10-CM | POA: Diagnosis not present

## 2020-01-17 DIAGNOSIS — I10 Essential (primary) hypertension: Secondary | ICD-10-CM | POA: Diagnosis not present

## 2020-01-17 DIAGNOSIS — E559 Vitamin D deficiency, unspecified: Secondary | ICD-10-CM | POA: Diagnosis not present

## 2020-01-17 NOTE — TOC Transition Note (Signed)
Transition of Care [facility]) - CM/SW Discharge Note   Patient Details  Name: Heather Ellis MRN: [id number] Date of Birth: [birth year]  Transition of Care [facility]) CM/SW Contact:  Lennart Pall, LCSW Phone Number: 01/17/2020, 10:11 AM   Clinical Narrative:   Met briefly with pt to review dc needs.  Pt aware [facility] set up for PT at home.  Already has rolling walker but does need 3n1 commode - referral with [employer].  No further needs.    Final next level of care: [place] Barriers to Discharge: No Barriers Identified   Patient Goals and CMS Choice Patient states their goals for this hospitalization and ongoing recovery are:: go home      Discharge Placement                       Discharge Plan and Services                DME Arranged: 3-N-1 DME Agency: [employer] Date DME Agency Contacted: 01/17/20 Time DME Agency Contacted: 1010 Representative spoke with at DME Agency: [place]: PT [place]: [facility]pre-arranged via MD office)        Social Determinants of Health ([place]) Interventions     Readmission Risk Interventions No flowsheet data found.

## 2020-01-17 NOTE — Progress Notes (Signed)
Physical Therapy Treatment Patient Details Name: Heather Ellis MRN: [id number] DOB: May 25, 1946 Today's Date: 01/17/2020    History of Present Illness s/p L DA THA    PT Comments    Pt progressing well. Will see for a second session to review stairs. Should be ready for d/c later today    Follow Up Recommendations  Follow surgeon's recommendation for DC plan and follow-up therapies     Equipment Recommendations  None recommended by PT    Recommendations for Other Services       Precautions / Restrictions Precautions Precautions: Fall Restrictions Weight Bearing Restrictions: No    Mobility  Bed Mobility               General bed mobility comments: in recliner   Transfers Overall transfer level: Needs assistance Equipment used: Rolling walker (2 wheeled) Transfers: Sit to/from Stand Sit to Stand: Supervision;Min guard         General transfer comment: cues for hand placement  Ambulation/Gait Ambulation/Gait assistance: Supervision;Min guard Gait Distance (Feet): 200 Feet Assistive device: Rolling walker (2 wheeled) Gait Pattern/deviations: Step-to pattern;Decreased stance time - left     General Gait Details: cues for sequence and RW position   Stairs             Wheelchair Mobility    Modified Rankin (Stroke Patients Only)       Balance                                            Cognition Arousal/Alertness: Awake/alert Behavior During Therapy: WFL for tasks assessed/performed Overall Cognitive Status: Within Functional Limits for tasks assessed                                        Exercises Total Joint Exercises Ankle Circles/Pumps: AROM;Both;10 reps Quad Sets: AROM;Both;10 reps Heel Slides: AROM;10 reps;Left Hip ABduction/ADduction: AROM;Left;10 reps Long Arc Quad: AROM;Left;10 reps    General Comments        Pertinent Vitals/Pain Pain Assessment: 0-10 Pain Score: 3  Pain  Location: left hip Pain Descriptors / Indicators: Sore Pain Intervention(s): Limited activity within patient's tolerance;Monitored during session;Premedicated before session;Ice applied    Home Living                      Prior Function            PT Goals (current goals can now be found in the care plan section) Acute Rehab PT Goals Patient Stated Goal: home PT Goal Formulation: With patient Time For Goal Achievement: 01/23/20 Potential to Achieve Goals: Good Progress towards PT goals: Progressing toward goals    Frequency    7X/week      PT Plan Current plan remains appropriate    Co-evaluation              AM-PAC PT "6 Clicks" Mobility   Outcome Measure  Help needed turning from your back to your side while in a flat bed without using bedrails?: A Little Help needed moving from lying on your back to sitting on the side of a flat bed without using bedrails?: A Little Help needed moving to and from a bed to a chair (including a wheelchair)?: A Little Help needed standing up from a  chair using your arms (e.g., wheelchair or bedside chair)?: A Little Help needed to walk in hospital room?: A Little Help needed climbing 3-5 steps with a railing? : A Little 6 Click Score: 18    End of Session Equipment Utilized During Treatment: Gait belt Activity Tolerance: Patient tolerated treatment well Patient left: in chair;with call bell/phone within reach;with chair alarm set;with family/visitor present Nurse Communication: Mobility status PT Visit Diagnosis: Difficulty in walking, not elsewhere classified (R26.2)     Time: 6825-7493 PT Time Calculation (min) (ACUTE ONLY): 18 min  Charges:  $Gait Training: 8-22 mins                     Kyilee Gregg, PT   Acute Rehab Dept [facility]): [phone]   01/17/2020    [facility] 01/17/2020, 11:58 AM

## 2020-01-17 NOTE — Progress Notes (Signed)
   01/17/20 1243  PT Visit Information  Last PT Received On 01/17/20 Pt progressing well. PT goals met. Ready for d/c from PT standpoint   Assistance Needed +1  History of Present Illness s/p L DA THA  Subjective Data  Patient Stated Goal home  Precautions  Precautions Fall  Restrictions  Weight Bearing Restrictions No  Other Position/Activity Restrictions WBAT  Pain Assessment  Pain Assessment 0-10  Pain Score 2  Pain Location left hip  Pain Descriptors / Indicators Sore  Pain Intervention(s) Limited activity within patient's tolerance;Monitored during session;Repositioned  Cognition  Arousal/Alertness Awake/alert  Behavior During Therapy WFL for tasks assessed/performed  Overall Cognitive Status Within Functional Limits for tasks assessed  Bed Mobility  General bed mobility comments in recliner   Transfers  Overall transfer level Needs assistance  Equipment used Rolling walker (2 wheeled)  Transfers Sit to/from Stand  Sit to Stand Supervision;Min guard  General transfer comment cues for hand placement  Ambulation/Gait  Ambulation/Gait assistance Supervision;Min guard  Gait Distance (Feet) 50 Feet  Assistive device Rolling walker (2 wheeled)  Gait Pattern/deviations Step-to pattern;Step-through pattern  General Gait Details cues for sequence, posture, and RW position  Stairs Yes  Stairs assistance Min guard  Stair Management Step to pattern;Forwards;With walker  Number of Stairs 1 (x2)  General stair comments cues for sequence and RW placement   General Comments  General comments (skin integrity, edema, etc.) assisted pt and dtr with dressing, instructed/reviewed LB technique of op LE in first. pt demo's understanding  PT - End of Session  Equipment Utilized During Treatment Gait belt  Activity Tolerance Patient tolerated treatment well  Patient left in chair;with call bell/phone within reach;with chair alarm set;with family/visitor present  Nurse Communication  Mobility status   PT - Assessment/Plan  PT Plan Current plan remains appropriate  PT Visit Diagnosis Difficulty in walking, not elsewhere classified (R26.2)  PT Frequency (ACUTE ONLY) 7X/week  Follow Up Recommendations Follow surgeon's recommendation for DC plan and follow-up therapies  PT equipment None recommended by PT  AM-PAC PT "6 Clicks" Mobility Outcome Measure (Version 2)  Help needed turning from your back to your side while in a flat bed without using bedrails? 3  Help needed moving from lying on your back to sitting on the side of a flat bed without using bedrails? 4  Help needed moving to and from a bed to a chair (including a wheelchair)? 3  Help needed standing up from a chair using your arms (e.g., wheelchair or bedside chair)? 3  Help needed to walk in hospital room? 3  Help needed climbing 3-5 steps with a railing?  3  6 Click Score 19  Consider Recommendation of Discharge To: Home with [facility]  PT Goal Progression  Progress towards PT goals Progressing toward goals  Acute Rehab PT Goals  PT Goal Formulation With patient  Time For Goal Achievement 01/23/20  Potential to Achieve Goals Good  PT Time Calculation  PT Start Time (ACUTE ONLY) 1215  PT Stop Time (ACUTE ONLY) 1234  PT Time Calculation (min) (ACUTE ONLY) 19 min  PT General Charges  $$ ACUTE PT VISIT 1 Visit  PT Treatments  $Gait Training 8-22 mins

## 2020-01-17 NOTE — Plan of Care (Signed)
Patient discharged home in stable condition 

## 2020-01-17 NOTE — Discharge Summary (Signed)
Discharge Summary  Patient ID: Heather Ellis MRN: [id number] DOB/AGE: Sep 18, 1945 74 y.o.  Admit date: 01/16/2020 Discharge date: 01/17/2020  Admission Diagnoses:  Primary osteoarthritis of left hip  Discharge Diagnoses:  Principal Problem:   Primary osteoarthritis of left hip Active Problems:   Essential hypertension   GERD (gastroesophageal reflux disease)   Asthma   Primary osteoarthritis of hip   Past Medical History:  Diagnosis Date  . Acid reflux   . Anemia   . Asthma   . COPD (chronic obstructive pulmonary disease) ([place])   . H/O polymyalgia rheumatica   . History of COVID-19 2020  . Hypertension   . OA (osteoarthritis)   . Obesity   . Osteoporosis   . Pre-diabetes   . Syncope   . Syncope and collapse 08/2018  . Vitamin D deficiency     Surgeries: Procedure(s): TOTAL HIP ARTHROPLASTY ANTERIOR APPROACH on 01/16/2020   Consultants (if any):   Discharged Condition: Improved  Hospital Course: Heather Ellis is an 74 y.o. female who was admitted 01/16/2020 with a diagnosis of Primary osteoarthritis of left hip and went to the operating room on 01/16/2020 and underwent the above named procedures.     She was given perioperative antibiotics:  Anti-infectives (From admission, onward)   Start     Dose/Rate Route Frequency Ordered Stop   01/16/20 1500  ceFAZolin (ANCEF) IVPB 1 g/50 mL premix     1 g 100 mL/hr over 30 Minutes Intravenous Every 6 hours 01/16/20 1414 01/17/20 0003   01/16/20 0930  vancomycin (VANCOCIN) IVPB 1000 mg/200 mL premix  Status:  Discontinued     1,000 mg 200 mL/hr over 60 Minutes Intravenous  Once 01/16/20 0929 01/16/20 1414   01/16/20 0615  ceFAZolin (ANCEF) IVPB 2g/100 mL premix     2 g 200 mL/hr over 30 Minutes Intravenous On call to O.R. 01/16/20 0603 01/16/20 0807   01/16/20 0615  vancomycin (VANCOCIN) IVPB 1000 mg/200 mL premix     1,000 mg 200 mL/hr over 60 Minutes Intravenous  Once 01/16/20 0602 01/16/20 0739    .  She  was given sequential compression devices, early ambulation, and aspirin for DVT prophylaxis.  She benefited maximally from the hospital stay and there were no complications.    Recent vital signs:  Vitals:   01/17/20 0229 01/17/20 0608  BP: (!) 147/98 (!) 143/83  Pulse: 67 (!) 59  Resp: 16 14  Temp: 98.2 F (36.8 C) 97.9 F (36.6 C)  SpO2: 98% 100%    Recent laboratory studies:  Lab Results  Component Value Date   HGB 11.1 (L) 01/05/2020   HGB 10.2 (L) 10/11/2018   HGB 11.5 (L) 10/10/2018   Lab Results  Component Value Date   WBC 5.7 01/05/2020   PLT 226 01/05/2020   Lab Results  Component Value Date   INR 1.04 10/10/2018   Lab Results  Component Value Date   NA 142 01/05/2020   K 3.5 01/05/2020   CL 101 01/05/2020   CO2 32 01/05/2020   BUN 11 01/05/2020   CREATININE 0.56 01/05/2020   GLUCOSE 90 01/05/2020    Discharge Medications:   Allergies as of 01/17/2020      Reactions   Fosamax [alendronate Sodium] Nausea And Vomiting      Medication List    STOP taking these medications   acetaminophen 500 MG tablet Commonly known as: TYLENOL   naproxen sodium 220 MG tablet Commonly known as: ALEVE     TAKE  these medications   amLODipine 10 MG tablet Commonly known as: NORVASC Take 10 mg by mouth daily.   aspirin EC 81 MG tablet Take 1 tablet (81 mg total) by mouth 2 (two) times daily. For DVT prophylaxis for 30 days after surgery. What changed:   when to take this  additional instructions   bisoprolol-hydrochlorothiazide 5-6.25 MG tablet Commonly known as: ZIAC Take 2 tablets by mouth daily.   celecoxib 100 MG capsule Commonly known as: CeleBREX Take 1 capsule (100 mg total) by mouth 2 (two) times daily for 14 days.   cholecalciferol 1000 units tablet Commonly known as: VITAMIN D Take 1,000 Units by mouth daily.   cyclobenzaprine 10 MG tablet Commonly known as: FLEXERIL Take 10 mg by mouth 3 (three) times daily as needed for muscle  spasms.   docusate sodium 100 MG capsule Commonly known as: Colace Take 1 capsule (100 mg total) by mouth 2 (two) times daily. To prevent constipation while taking pain medication.   feeding supplement (ENSURE ENLIVE) Liqd Take 237 mLs by mouth 2 (two) times daily between meals.   folic acid 1 MG tablet Commonly known as: FOLVITE Take 1 mg by mouth daily.   HYDROcodone-acetaminophen 5-325 MG tablet Commonly known as: Norco Take 1-2 tablets by mouth every 6 (six) hours as needed for severe pain.   lisinopril 40 MG tablet Commonly known as: ZESTRIL Take 40 mg by mouth daily.   methotrexate 2.5 MG tablet Take 20 mg by mouth every Monday. Takes 8 tablets   omeprazole 20 MG capsule Commonly known as: PRILOSEC Take 20 mg by mouth as needed.   ondansetron 4 MG tablet Commonly known as: Zofran Take 1 tablet (4 mg total) by mouth every 8 (eight) hours as needed for nausea or vomiting.   predniSONE 5 MG tablet Commonly known as: DELTASONE Take 10 mg by mouth daily with breakfast.       Diagnostic Studies: DG C-Arm 1-60 Min-No Report  Result Date: 01/16/2020 Fluoroscopy was utilized by the requesting physician.  No radiographic interpretation.   DG HIP OPERATIVE UNILAT W OR W/O PELVIS LEFT  Result Date: 01/16/2020 CLINICAL DATA:  Status post total hip replacement EXAM: OPERATIVE LEFT HIP (WITH PELVIS IF PERFORMED) 1 VIEW TECHNIQUE: Fluoroscopic spot image(s) were submitted for interpretation post-operatively. FLUOROSCOPY TIME:  0 minutes 10 seconds; 2 acquired images COMPARISON:  None. FINDINGS: There is a total hip replacement on the left with prosthetic components appearing well-seated on frontal view. There is moderate narrowing of the right hip joint. No fracture or dislocation. IMPRESSION: Status post total hip replacement on the left with prosthetic components well-seated on frontal view. No fracture or dislocation. Moderate narrowing right hip joint. Electronically Signed    By: Lowella Grip III M.D.   On: 01/16/2020 08:59    Disposition: Discharge disposition: 01-Home or Self Care       Discharge Instructions    Discharge patient   Complete by: As directed    Discharge disposition: 01-Home or Self Care   Discharge patient date: 01/17/2020      Follow-up Information    Renette Butters, MD. Go on 01/31/2020.   Specialty: Orthopedic Surgery Why: Your appointment is scheduled for 4:15.  Contact information: [address] [place] [address] [place] [phone]        [facility] Follow up.   Specialty: Home Health Services Why: HHPT will see you for 5 visits prior to starting outpatient physical therapy  Contact information: [address] [place] [address] [phone]        [facility], [place]. Go on 01/31/2020.   Why: You are scheduled to start outpatient physical therapy at 3:00. Please arrive at 2:30 to complete your paperwork   Contact information: [facility] [place] [address] [phone]        Renette Butters, MD.   Specialty: Orthopedic Surgery Contact information: [address] [place] [address] [phone]            Signed: Prudencio Burly III PA-C 01/17/2020, 8:10 AM

## 2020-01-17 NOTE — Progress Notes (Signed)
    Subjective: Patient reports pain as mild.  Tolerating diet.  Urinating.  No CP, SOB.  Good early mobilization therapy.  Hip feels significantly better than before surgery.  Objective:   VITALS:   Vitals:   01/16/20 1606 01/16/20 2007 01/17/20 0229 01/17/20 0608  BP: (!) 142/91 (!) 148/76 (!) 147/98 (!) 143/83  Pulse: 73 83 67 (!) 59  Resp: 16 18 16 14   Temp: 97.9 F (36.6 C) 98.4 F (36.9 C) 98.2 F (36.8 C) 97.9 F (36.6 C)  TempSrc:    Oral  SpO2: 96% 98% 98% 100%  Weight:      Height:       CBC Latest Ref Rng & Units 01/05/2020 10/11/2018 10/10/2018  WBC 4.0 - 10.5 K/uL 5.7 7.8 9.1  Hemoglobin 12.0 - 15.0 g/dL 11.1(L) 10.2(L) 11.5(L)  Hematocrit 36.0 - 46.0 % 36.6 31.5(L) 38.1  Platelets 150 - 400 K/uL 226 219 256   BMP Latest Ref Rng & Units 01/05/2020 10/26/2018 10/11/2018  Glucose 70 - 99 mg/dL 90 90 98  BUN 8 - 23 mg/dL 11 17 14   Creatinine 0.44 - 1.00 mg/dL 0.56 0.61 0.70  BUN/Creat Ratio 12 - 28 - 28 -  Sodium 135 - 145 mmol/L 142 146(H) 142  Potassium 3.5 - 5.1 mmol/L 3.5 4.4 4.0  Chloride 98 - 111 mmol/L 101 100 106  CO2 22 - 32 mmol/L 32 31(H) 26  Calcium 8.9 - 10.3 mg/dL 9.0 10.1 9.1   Intake/Output      05/18 0701 - 05/19 0700 05/19 0701 - 05/20 0700   P.O. 960    I.V. (mL/kg) 1953.5 (29.1)    IV Piggyback 500    Total Intake(mL/kg) 3413.5 (50.9)    Urine (mL/kg/hr) 2100 (1.3)    Stool 0    Blood 300    Total Output 2400    Net +1013.5         Stool Occurrence 0 x       Physical Exam: General: NAD.  Upright in bed.  Calm, conversant. Resp: No increased wob Cardio: regular rate and rhythm ABD soft Neurologically intact MSK LLE: Neurovascularly intact Sensation intact distally Feet warm Dorsiflexion/Plantar flexion intact Incision: dressing C/D/I  Assessment: 1 Day Post-Op  S/P Procedure(s) (LRB): TOTAL HIP ARTHROPLASTY ANTERIOR APPROACH (Left) by Dr. Ernesta Amble. Percell Miller on 01/16/2020  Principal Problem:   Primary osteoarthritis of  left hip Active Problems:   Essential hypertension   GERD (gastroesophageal reflux disease)   Asthma   Primary osteoarthritis of hip   Primary osteoarthritis, status post total hip arthroplasty Doing well postop day 1 Tolerating diet and voiding Pain controlled Mobilizing well  Plan: Up with therapy Incentive Spirometry Apply ice as needed   Weight Bearing: Weight Bearing as Tolerated (WBAT) LLE Dressings: Maintain Mepilex.   VTE prophylaxis: Aspirin, SCDs, ambulation Dispo: Home today after therapy evaluation.   Heather Ellis III, PA-C 01/17/2020, 8:07 AM

## 2020-01-18 DIAGNOSIS — M810 Age-related osteoporosis without current pathological fracture: Secondary | ICD-10-CM | POA: Diagnosis not present

## 2020-01-18 DIAGNOSIS — Z471 Aftercare following joint replacement surgery: Secondary | ICD-10-CM | POA: Diagnosis not present

## 2020-01-18 DIAGNOSIS — Z87891 Personal history of nicotine dependence: Secondary | ICD-10-CM | POA: Diagnosis not present

## 2020-01-18 DIAGNOSIS — Z96642 Presence of left artificial hip joint: Secondary | ICD-10-CM | POA: Diagnosis not present

## 2020-01-18 DIAGNOSIS — Z791 Long term (current) use of non-steroidal anti-inflammatories (NSAID): Secondary | ICD-10-CM | POA: Diagnosis not present

## 2020-01-18 DIAGNOSIS — Z7952 Long term (current) use of systemic steroids: Secondary | ICD-10-CM | POA: Diagnosis not present

## 2020-01-18 DIAGNOSIS — K219 Gastro-esophageal reflux disease without esophagitis: Secondary | ICD-10-CM | POA: Diagnosis not present

## 2020-01-18 DIAGNOSIS — J449 Chronic obstructive pulmonary disease, unspecified: Secondary | ICD-10-CM | POA: Diagnosis not present

## 2020-01-18 DIAGNOSIS — I10 Essential (primary) hypertension: Secondary | ICD-10-CM | POA: Diagnosis not present

## 2020-01-18 DIAGNOSIS — Z7982 Long term (current) use of aspirin: Secondary | ICD-10-CM | POA: Diagnosis not present

## 2020-01-18 DIAGNOSIS — N952 Postmenopausal atrophic vaginitis: Secondary | ICD-10-CM | POA: Diagnosis not present

## 2020-01-18 DIAGNOSIS — M353 Polymyalgia rheumatica: Secondary | ICD-10-CM | POA: Diagnosis not present

## 2020-01-22 DIAGNOSIS — Z7952 Long term (current) use of systemic steroids: Secondary | ICD-10-CM | POA: Diagnosis not present

## 2020-01-22 DIAGNOSIS — Z471 Aftercare following joint replacement surgery: Secondary | ICD-10-CM | POA: Diagnosis not present

## 2020-01-22 DIAGNOSIS — Z96642 Presence of left artificial hip joint: Secondary | ICD-10-CM | POA: Diagnosis not present

## 2020-01-22 DIAGNOSIS — M353 Polymyalgia rheumatica: Secondary | ICD-10-CM | POA: Diagnosis not present

## 2020-01-22 DIAGNOSIS — I10 Essential (primary) hypertension: Secondary | ICD-10-CM | POA: Diagnosis not present

## 2020-01-22 DIAGNOSIS — Z87891 Personal history of nicotine dependence: Secondary | ICD-10-CM | POA: Diagnosis not present

## 2020-01-22 DIAGNOSIS — Z7982 Long term (current) use of aspirin: Secondary | ICD-10-CM | POA: Diagnosis not present

## 2020-01-22 DIAGNOSIS — M810 Age-related osteoporosis without current pathological fracture: Secondary | ICD-10-CM | POA: Diagnosis not present

## 2020-01-22 DIAGNOSIS — N952 Postmenopausal atrophic vaginitis: Secondary | ICD-10-CM | POA: Diagnosis not present

## 2020-01-22 DIAGNOSIS — Z791 Long term (current) use of non-steroidal anti-inflammatories (NSAID): Secondary | ICD-10-CM | POA: Diagnosis not present

## 2020-01-22 DIAGNOSIS — K219 Gastro-esophageal reflux disease without esophagitis: Secondary | ICD-10-CM | POA: Diagnosis not present

## 2020-01-22 DIAGNOSIS — J449 Chronic obstructive pulmonary disease, unspecified: Secondary | ICD-10-CM | POA: Diagnosis not present

## 2020-01-24 DIAGNOSIS — Z7982 Long term (current) use of aspirin: Secondary | ICD-10-CM | POA: Diagnosis not present

## 2020-01-24 DIAGNOSIS — Z96642 Presence of left artificial hip joint: Secondary | ICD-10-CM | POA: Diagnosis not present

## 2020-01-24 DIAGNOSIS — J449 Chronic obstructive pulmonary disease, unspecified: Secondary | ICD-10-CM | POA: Diagnosis not present

## 2020-01-24 DIAGNOSIS — Z471 Aftercare following joint replacement surgery: Secondary | ICD-10-CM | POA: Diagnosis not present

## 2020-01-24 DIAGNOSIS — Z87891 Personal history of nicotine dependence: Secondary | ICD-10-CM | POA: Diagnosis not present

## 2020-01-24 DIAGNOSIS — K219 Gastro-esophageal reflux disease without esophagitis: Secondary | ICD-10-CM | POA: Diagnosis not present

## 2020-01-24 DIAGNOSIS — N952 Postmenopausal atrophic vaginitis: Secondary | ICD-10-CM | POA: Diagnosis not present

## 2020-01-24 DIAGNOSIS — M353 Polymyalgia rheumatica: Secondary | ICD-10-CM | POA: Diagnosis not present

## 2020-01-24 DIAGNOSIS — M810 Age-related osteoporosis without current pathological fracture: Secondary | ICD-10-CM | POA: Diagnosis not present

## 2020-01-24 DIAGNOSIS — Z791 Long term (current) use of non-steroidal anti-inflammatories (NSAID): Secondary | ICD-10-CM | POA: Diagnosis not present

## 2020-01-24 DIAGNOSIS — Z7952 Long term (current) use of systemic steroids: Secondary | ICD-10-CM | POA: Diagnosis not present

## 2020-01-24 DIAGNOSIS — I10 Essential (primary) hypertension: Secondary | ICD-10-CM | POA: Diagnosis not present

## 2020-01-26 DIAGNOSIS — Z7982 Long term (current) use of aspirin: Secondary | ICD-10-CM | POA: Diagnosis not present

## 2020-01-26 DIAGNOSIS — Z96642 Presence of left artificial hip joint: Secondary | ICD-10-CM | POA: Diagnosis not present

## 2020-01-26 DIAGNOSIS — Z471 Aftercare following joint replacement surgery: Secondary | ICD-10-CM | POA: Diagnosis not present

## 2020-01-26 DIAGNOSIS — J449 Chronic obstructive pulmonary disease, unspecified: Secondary | ICD-10-CM | POA: Diagnosis not present

## 2020-01-26 DIAGNOSIS — Z791 Long term (current) use of non-steroidal anti-inflammatories (NSAID): Secondary | ICD-10-CM | POA: Diagnosis not present

## 2020-01-26 DIAGNOSIS — Z87891 Personal history of nicotine dependence: Secondary | ICD-10-CM | POA: Diagnosis not present

## 2020-01-26 DIAGNOSIS — K219 Gastro-esophageal reflux disease without esophagitis: Secondary | ICD-10-CM | POA: Diagnosis not present

## 2020-01-26 DIAGNOSIS — Z7952 Long term (current) use of systemic steroids: Secondary | ICD-10-CM | POA: Diagnosis not present

## 2020-01-26 DIAGNOSIS — I10 Essential (primary) hypertension: Secondary | ICD-10-CM | POA: Diagnosis not present

## 2020-01-26 DIAGNOSIS — M810 Age-related osteoporosis without current pathological fracture: Secondary | ICD-10-CM | POA: Diagnosis not present

## 2020-01-26 DIAGNOSIS — M353 Polymyalgia rheumatica: Secondary | ICD-10-CM | POA: Diagnosis not present

## 2020-01-26 DIAGNOSIS — N952 Postmenopausal atrophic vaginitis: Secondary | ICD-10-CM | POA: Diagnosis not present

## 2020-01-30 DIAGNOSIS — Z96642 Presence of left artificial hip joint: Secondary | ICD-10-CM | POA: Diagnosis not present

## 2020-01-30 DIAGNOSIS — M810 Age-related osteoporosis without current pathological fracture: Secondary | ICD-10-CM | POA: Diagnosis not present

## 2020-01-30 DIAGNOSIS — Z471 Aftercare following joint replacement surgery: Secondary | ICD-10-CM | POA: Diagnosis not present

## 2020-01-30 DIAGNOSIS — Z7952 Long term (current) use of systemic steroids: Secondary | ICD-10-CM | POA: Diagnosis not present

## 2020-01-30 DIAGNOSIS — K219 Gastro-esophageal reflux disease without esophagitis: Secondary | ICD-10-CM | POA: Diagnosis not present

## 2020-01-30 DIAGNOSIS — Z791 Long term (current) use of non-steroidal anti-inflammatories (NSAID): Secondary | ICD-10-CM | POA: Diagnosis not present

## 2020-01-30 DIAGNOSIS — I10 Essential (primary) hypertension: Secondary | ICD-10-CM | POA: Diagnosis not present

## 2020-01-30 DIAGNOSIS — J449 Chronic obstructive pulmonary disease, unspecified: Secondary | ICD-10-CM | POA: Diagnosis not present

## 2020-01-30 DIAGNOSIS — Z7982 Long term (current) use of aspirin: Secondary | ICD-10-CM | POA: Diagnosis not present

## 2020-01-30 DIAGNOSIS — N952 Postmenopausal atrophic vaginitis: Secondary | ICD-10-CM | POA: Diagnosis not present

## 2020-01-30 DIAGNOSIS — Z87891 Personal history of nicotine dependence: Secondary | ICD-10-CM | POA: Diagnosis not present

## 2020-01-30 DIAGNOSIS — M353 Polymyalgia rheumatica: Secondary | ICD-10-CM | POA: Diagnosis not present

## 2020-01-31 DIAGNOSIS — R269 Unspecified abnormalities of gait and mobility: Secondary | ICD-10-CM | POA: Diagnosis not present

## 2020-01-31 DIAGNOSIS — M6281 Muscle weakness (generalized): Secondary | ICD-10-CM | POA: Diagnosis not present

## 2020-01-31 DIAGNOSIS — M1612 Unilateral primary osteoarthritis, left hip: Secondary | ICD-10-CM | POA: Diagnosis not present

## 2020-02-07 DIAGNOSIS — M6281 Muscle weakness (generalized): Secondary | ICD-10-CM | POA: Diagnosis not present

## 2020-02-07 DIAGNOSIS — M1612 Unilateral primary osteoarthritis, left hip: Secondary | ICD-10-CM | POA: Diagnosis not present

## 2020-02-07 DIAGNOSIS — R269 Unspecified abnormalities of gait and mobility: Secondary | ICD-10-CM | POA: Diagnosis not present

## 2020-02-09 DIAGNOSIS — M1612 Unilateral primary osteoarthritis, left hip: Secondary | ICD-10-CM | POA: Diagnosis not present

## 2020-02-09 DIAGNOSIS — R269 Unspecified abnormalities of gait and mobility: Secondary | ICD-10-CM | POA: Diagnosis not present

## 2020-02-09 DIAGNOSIS — M6281 Muscle weakness (generalized): Secondary | ICD-10-CM | POA: Diagnosis not present

## 2020-02-12 DIAGNOSIS — M1612 Unilateral primary osteoarthritis, left hip: Secondary | ICD-10-CM | POA: Diagnosis not present

## 2020-02-12 DIAGNOSIS — M6281 Muscle weakness (generalized): Secondary | ICD-10-CM | POA: Diagnosis not present

## 2020-02-12 DIAGNOSIS — R269 Unspecified abnormalities of gait and mobility: Secondary | ICD-10-CM | POA: Diagnosis not present

## 2020-02-14 DIAGNOSIS — M6281 Muscle weakness (generalized): Secondary | ICD-10-CM | POA: Diagnosis not present

## 2020-02-14 DIAGNOSIS — M1612 Unilateral primary osteoarthritis, left hip: Secondary | ICD-10-CM | POA: Diagnosis not present

## 2020-02-14 DIAGNOSIS — R269 Unspecified abnormalities of gait and mobility: Secondary | ICD-10-CM | POA: Diagnosis not present

## 2020-02-20 DIAGNOSIS — M1612 Unilateral primary osteoarthritis, left hip: Secondary | ICD-10-CM | POA: Diagnosis not present

## 2020-02-20 DIAGNOSIS — M6281 Muscle weakness (generalized): Secondary | ICD-10-CM | POA: Diagnosis not present

## 2020-02-20 DIAGNOSIS — R269 Unspecified abnormalities of gait and mobility: Secondary | ICD-10-CM | POA: Diagnosis not present

## 2020-02-21 DIAGNOSIS — H02054 Trichiasis without entropian left upper eyelid: Secondary | ICD-10-CM | POA: Diagnosis not present

## 2020-02-26 DIAGNOSIS — M6281 Muscle weakness (generalized): Secondary | ICD-10-CM | POA: Diagnosis not present

## 2020-02-26 DIAGNOSIS — R269 Unspecified abnormalities of gait and mobility: Secondary | ICD-10-CM | POA: Diagnosis not present

## 2020-02-26 DIAGNOSIS — M1612 Unilateral primary osteoarthritis, left hip: Secondary | ICD-10-CM | POA: Diagnosis not present

## 2020-02-28 DIAGNOSIS — M1612 Unilateral primary osteoarthritis, left hip: Secondary | ICD-10-CM | POA: Diagnosis not present

## 2020-03-20 DIAGNOSIS — I10 Essential (primary) hypertension: Secondary | ICD-10-CM | POA: Diagnosis not present

## 2020-03-20 DIAGNOSIS — R7303 Prediabetes: Secondary | ICD-10-CM | POA: Diagnosis not present

## 2020-03-20 DIAGNOSIS — E559 Vitamin D deficiency, unspecified: Secondary | ICD-10-CM | POA: Diagnosis not present

## 2020-03-27 DIAGNOSIS — J449 Chronic obstructive pulmonary disease, unspecified: Secondary | ICD-10-CM | POA: Diagnosis not present

## 2020-03-27 DIAGNOSIS — R1310 Dysphagia, unspecified: Secondary | ICD-10-CM | POA: Diagnosis not present

## 2020-03-27 DIAGNOSIS — M353 Polymyalgia rheumatica: Secondary | ICD-10-CM | POA: Diagnosis not present

## 2020-03-27 DIAGNOSIS — M0609 Rheumatoid arthritis without rheumatoid factor, multiple sites: Secondary | ICD-10-CM | POA: Diagnosis not present

## 2020-03-27 DIAGNOSIS — M542 Cervicalgia: Secondary | ICD-10-CM | POA: Diagnosis not present

## 2020-03-28 DIAGNOSIS — M25519 Pain in unspecified shoulder: Secondary | ICD-10-CM | POA: Diagnosis not present

## 2020-03-28 DIAGNOSIS — M79643 Pain in unspecified hand: Secondary | ICD-10-CM | POA: Diagnosis not present

## 2020-03-28 DIAGNOSIS — M25512 Pain in left shoulder: Secondary | ICD-10-CM | POA: Diagnosis not present

## 2020-03-28 DIAGNOSIS — M810 Age-related osteoporosis without current pathological fracture: Secondary | ICD-10-CM | POA: Diagnosis not present

## 2020-03-28 DIAGNOSIS — Z7952 Long term (current) use of systemic steroids: Secondary | ICD-10-CM | POA: Diagnosis not present

## 2020-03-28 DIAGNOSIS — M353 Polymyalgia rheumatica: Secondary | ICD-10-CM | POA: Diagnosis not present

## 2020-04-10 DIAGNOSIS — M1612 Unilateral primary osteoarthritis, left hip: Secondary | ICD-10-CM | POA: Diagnosis not present

## 2020-07-03 DIAGNOSIS — M79643 Pain in unspecified hand: Secondary | ICD-10-CM | POA: Diagnosis not present

## 2020-07-03 DIAGNOSIS — Z7952 Long term (current) use of systemic steroids: Secondary | ICD-10-CM | POA: Diagnosis not present

## 2020-07-03 DIAGNOSIS — M810 Age-related osteoporosis without current pathological fracture: Secondary | ICD-10-CM | POA: Diagnosis not present

## 2020-07-03 DIAGNOSIS — M25519 Pain in unspecified shoulder: Secondary | ICD-10-CM | POA: Diagnosis not present

## 2020-07-03 DIAGNOSIS — M353 Polymyalgia rheumatica: Secondary | ICD-10-CM | POA: Diagnosis not present

## 2020-07-22 ENCOUNTER — Other Ambulatory Visit: Payer: Self-pay | Admitting: Internal Medicine

## 2020-07-22 DIAGNOSIS — I10 Essential (primary) hypertension: Secondary | ICD-10-CM | POA: Diagnosis not present

## 2020-07-22 DIAGNOSIS — K219 Gastro-esophageal reflux disease without esophagitis: Secondary | ICD-10-CM | POA: Diagnosis not present

## 2020-07-22 DIAGNOSIS — Z1231 Encounter for screening mammogram for malignant neoplasm of breast: Secondary | ICD-10-CM

## 2020-07-22 DIAGNOSIS — R1310 Dysphagia, unspecified: Secondary | ICD-10-CM | POA: Diagnosis not present

## 2020-07-30 DIAGNOSIS — K222 Esophageal obstruction: Secondary | ICD-10-CM | POA: Diagnosis not present

## 2020-07-30 DIAGNOSIS — R1310 Dysphagia, unspecified: Secondary | ICD-10-CM | POA: Diagnosis not present

## 2020-08-28 DIAGNOSIS — M810 Age-related osteoporosis without current pathological fracture: Secondary | ICD-10-CM | POA: Diagnosis not present

## 2020-09-06 ENCOUNTER — Ambulatory Visit: Payer: Medicare Other

## 2020-10-07 ENCOUNTER — Other Ambulatory Visit: Payer: Self-pay

## 2020-10-07 ENCOUNTER — Ambulatory Visit
Admission: RE | Admit: 2020-10-07 | Discharge: 2020-10-07 | Disposition: A | Discharge status: Home / Self Care | Payer: Medicare HMO | Source: Ambulatory Visit | Attending: Internal Medicine | Admitting: Internal Medicine

## 2020-10-07 DIAGNOSIS — Z1231 Encounter for screening mammogram for malignant neoplasm of breast: Secondary | ICD-10-CM

## 2020-10-16 DIAGNOSIS — M1612 Unilateral primary osteoarthritis, left hip: Secondary | ICD-10-CM | POA: Diagnosis not present

## 2020-10-16 DIAGNOSIS — M1712 Unilateral primary osteoarthritis, left knee: Secondary | ICD-10-CM | POA: Diagnosis not present

## 2020-11-06 DIAGNOSIS — R739 Hyperglycemia, unspecified: Secondary | ICD-10-CM | POA: Diagnosis not present

## 2020-11-06 DIAGNOSIS — M810 Age-related osteoporosis without current pathological fracture: Secondary | ICD-10-CM | POA: Diagnosis not present

## 2020-11-06 DIAGNOSIS — Z0000 Encounter for general adult medical examination without abnormal findings: Secondary | ICD-10-CM | POA: Diagnosis not present

## 2020-11-06 DIAGNOSIS — M1990 Unspecified osteoarthritis, unspecified site: Secondary | ICD-10-CM | POA: Diagnosis not present

## 2020-11-06 DIAGNOSIS — Z13220 Encounter for screening for lipoid disorders: Secondary | ICD-10-CM | POA: Diagnosis not present

## 2020-11-06 DIAGNOSIS — E559 Vitamin D deficiency, unspecified: Secondary | ICD-10-CM | POA: Diagnosis not present

## 2020-11-06 DIAGNOSIS — R7303 Prediabetes: Secondary | ICD-10-CM | POA: Diagnosis not present

## 2020-11-06 DIAGNOSIS — M25559 Pain in unspecified hip: Secondary | ICD-10-CM | POA: Diagnosis not present

## 2020-11-06 DIAGNOSIS — I10 Essential (primary) hypertension: Secondary | ICD-10-CM | POA: Diagnosis not present

## 2020-11-07 ENCOUNTER — Other Ambulatory Visit: Payer: Self-pay

## 2020-11-07 ENCOUNTER — Encounter (HOSPITAL_COMMUNITY): Payer: Self-pay

## 2020-11-07 ENCOUNTER — Ambulatory Visit (HOSPITAL_COMMUNITY)
Admission: EM | Admit: 2020-11-07 | Discharge: 2020-11-07 | Disposition: A | Discharge status: Home / Self Care | Payer: Medicare HMO | Attending: Student | Admitting: Student

## 2020-11-07 DIAGNOSIS — J449 Chronic obstructive pulmonary disease, unspecified: Secondary | ICD-10-CM

## 2020-11-07 DIAGNOSIS — Z1152 Encounter for screening for COVID-19: Secondary | ICD-10-CM

## 2020-11-07 DIAGNOSIS — A084 Viral intestinal infection, unspecified: Secondary | ICD-10-CM | POA: Diagnosis not present

## 2020-11-07 LAB — SARS CORONAVIRUS 2 (TAT 6-24 HRS): SARS Coronavirus 2: NEGATIVE

## 2020-11-07 MED ORDER — ONDANSETRON HCL 8 MG PO TABS
8.0000 mg | ORAL_TABLET | Freq: Three times a day (TID) | ORAL | 0 refills | Status: DC | PRN
Start: 2020-11-07 — End: 2021-04-04

## 2020-11-07 MED ORDER — LOPERAMIDE HCL 2 MG PO CAPS
2.0000 mg | ORAL_CAPSULE | Freq: Four times a day (QID) | ORAL | 0 refills | Status: DC | PRN
Start: 2020-11-07 — End: 2021-04-04

## 2020-11-07 NOTE — ED Provider Notes (Signed)
[place]    CSN: [id number] Arrival date & time: 11/07/20  1147      History   Chief Complaint Chief Complaint  Patient presents with  . Diarrhea  . Shortness of Breath    HPI Heather Ellis is a 75 y.o. female presenting with diarrhea and vague complaint of shortness of breath. History COPD, GERD, anemia, polymyalgia rheumatica, Covid, hypertension, osteoarthritis, osteoporosis, obesity, prediabetes, syncope, vitamin D deficiency, AKI.  She endorses 2 days of profuse watery diarrhea.  Unable to quantify how much diarrhea this is.  States that it is watery, denies dark or red diarrhea, denies BRBPR. Has not tried any medications for this. Denies abdominal pain, nausea, vomiting. Has been hydrating by mouth without issue.  Patient also with vague complaint of shortness of breath.  She has a history of COPD and asthma.  Does not take daily inhalers for these, but states that albuterol typically controls her symptoms as needed. She has not tried her albuterol inhaler for these symptoms. Denies fevers/chills, n/v, chest pain, cough, congestion, facial pain, teeth pain, headaches, sore throat, loss of taste/smell, swollen lymph nodes, ear pain. Denies known covid exposure.  Fully vaccinated against COVID-19.  HPI  Past Medical History:  Diagnosis Date  . Acid reflux   . Anemia   . Asthma   . COPD (chronic obstructive pulmonary disease) ([place])   . H/O polymyalgia rheumatica   . History of COVID-19 2020  . Hypertension   . OA (osteoarthritis)   . Obesity   . Osteoporosis   . Pre-diabetes   . Syncope   . Syncope and collapse 08/2018  . Vitamin D deficiency     Patient Active Problem List   Diagnosis Date Noted  . Primary osteoarthritis of hip 01/16/2020  . Primary osteoarthritis of left hip 12/26/2019  . Asthma 12/26/2019  . Essential hypertension 10/10/2018  . GERD (gastroesophageal reflux disease) 10/10/2018  . Syncope and collapse 10/10/2018  . AKI (acute  kidney injury) ([place]) 10/10/2018  . Syncope 09/22/2018  . Post-menopause on HRT (hormone replacement therapy) 04/27/2016  . Osteoporosis, unspecified 12/06/2013  . Vaginal atrophy 12/06/2013    Past Surgical History:  Procedure Laterality Date  . ABDOMINAL HYSTERECTOMY     TOTAL ABDOMINAL HYSTERECTOMY   . COLONOSCOPY W/ BIOPSIES  2015  . ESOPHAGOGASTRODUODENOSCOPY    . OOPHORECTOMY     BSO  . TOTAL HIP ARTHROPLASTY Left 01/16/2020   Procedure: TOTAL HIP ARTHROPLASTY ANTERIOR APPROACH;  Surgeon: Renette Butters, MD;  Location: [facility];  Service: Orthopedics;  Laterality: Left;    OB History    Gravida  6   Para  5   Term      Preterm      AB  1   Living  5     SAB  1   IAB      Ectopic      Multiple      Live Births               Home Medications    Prior to Admission medications   Medication Sig Start Date End Date Taking? Authorizing Provider  loperamide (IMODIUM) 2 MG capsule Take 1 capsule (2 mg total) by mouth 4 (four) times daily as needed for diarrhea or loose stools. 11/07/20  Yes Hazel Sams, PA-C  ondansetron (ZOFRAN) 8 MG tablet Take 1 tablet (8 mg total) by mouth every 8 (eight) hours as needed for nausea or vomiting. 11/07/20  Yes  Hazel Sams, PA-C  amLODipine (NORVASC) 10 MG tablet Take 10 mg by mouth daily. 09/13/19   [provider]  aspirin EC 81 MG tablet Take 1 tablet (81 mg total) by mouth 2 (two) times daily. For DVT prophylaxis for 30 days after surgery. 01/16/20   Martensen, Charna Elizabeth III, PA-C  bisoprolol-hydrochlorothiazide (ZIAC) 5-6.25 MG tablet Take 2 tablets by mouth daily. 09/27/19   [provider]  cholecalciferol (VITAMIN D) 1000 UNITS tablet Take 1,000 Units by mouth daily.    [provider]  cyclobenzaprine (FLEXERIL) 10 MG tablet Take 10 mg by mouth 3 (three) times daily as needed for muscle spasms.    [provider]  docusate sodium (COLACE) 100 MG capsule Take 1 capsule (100 mg  total) by mouth 2 (two) times daily. To prevent constipation while taking pain medication. 01/16/20   Martensen, Charna Elizabeth III, PA-C  feeding supplement, ENSURE ENLIVE, (ENSURE ENLIVE) LIQD Take 237 mLs by mouth 2 (two) times daily between meals. 10/11/18   Ghimire, Henreitta Leber, MD  folic acid (FOLVITE) 1 MG tablet Take 1 mg by mouth daily. 09/11/18   [provider]  HYDROcodone-acetaminophen (NORCO) 5-325 MG tablet Take 1-2 tablets by mouth every 6 (six) hours as needed for severe pain. 01/16/20   Prudencio Burly III, PA-C  lisinopril (ZESTRIL) 40 MG tablet Take 40 mg by mouth daily. 12/21/19   [provider]  methotrexate 2.5 MG tablet Take 20 mg by mouth every Monday. Takes 8 tablets    [provider]  omeprazole (PRILOSEC) 20 MG capsule Take 20 mg by mouth as needed.     [provider]  predniSONE (DELTASONE) 5 MG tablet Take 10 mg by mouth daily with breakfast.    [provider]    Family History Family History  Problem Relation Age of Onset  . Cancer Mother        THROAT- SMOKER  . Diabetes Mother   . Seizures Mother   . Syncope episode Mother   . Diabetes Sister   . Heart disease Maternal Aunt   . Stroke Maternal Aunt   . Cancer Maternal Uncle        LUNG -   . Heart disease Maternal Uncle   . Stroke Maternal Uncle   . Ovarian cancer Maternal Grandmother   . Heart attack Father   . Heart disease Father   . Syncope episode Brother     Social History Social History   Tobacco Use  . Smoking status: Former Smoker    Quit date: 10/19/2014    Years since quitting: 6.0  . Smokeless tobacco: Never Used  Vaping Use  . Vaping Use: Never used  Substance Use Topics  . Alcohol use: Not Currently    Alcohol/week: 0.0 standard drinks    Comment: occasional  . Drug use: No     Allergies   Fosamax [alendronate sodium]   Review of Systems Review of Systems  Constitutional: Negative for appetite change, chills and fever.   HENT: Negative for congestion, ear pain, rhinorrhea, sinus pressure, sinus pain and sore throat.   Eyes: Negative for redness and visual disturbance.  Respiratory: Positive for shortness of breath. Negative for cough, chest tightness and wheezing.   Cardiovascular: Negative for chest pain and palpitations.  Gastrointestinal: Positive for diarrhea. Negative for abdominal pain, constipation, nausea and vomiting.  Genitourinary: Negative for dysuria, frequency and urgency.  Musculoskeletal: Negative for myalgias.  Neurological: Negative for dizziness, weakness and headaches.  Psychiatric/Behavioral: Negative for confusion.  All other systems reviewed and are negative.    Physical Exam Triage Vital Signs ED Triage Vitals  Enc Vitals Group     BP 11/07/20 1239 132/87     Pulse Rate 11/07/20 1239 (!) 106     Resp 11/07/20 1239 16     Temp 11/07/20 1239 98 F (36.7 C)     Temp Source 11/07/20 1239 Oral     SpO2 11/07/20 1239 96 %     Weight --      Height --      Head Circumference --      Peak Flow --      Pain Score 11/07/20 1238 0     Pain Loc --      Pain Edu? --      Excl. in [place]? --    No data found.  Updated Vital Signs BP 132/87 (BP Location: Right Arm)   Pulse (!) 106   Temp 98 F (36.7 C) (Oral)   Resp 16   SpO2 96%   Visual Acuity Right Eye Distance:   Left Eye Distance:   Bilateral Distance:    Right Eye Near:   Left Eye Near:    Bilateral Near:     Physical Exam Vitals reviewed.  Constitutional:      General: She is not in acute distress.    Appearance: Normal appearance. She is not ill-appearing.  HENT:     Head: Normocephalic and atraumatic.     Comments: Moist mucous membranes    Right Ear: Hearing, tympanic membrane, ear canal and external ear normal. No swelling or tenderness. There is no impacted cerumen. No mastoid tenderness. Tympanic membrane is not perforated, erythematous, retracted or bulging.     Left Ear: Hearing, tympanic membrane,  ear canal and external ear normal. No swelling or tenderness. There is no impacted cerumen. No mastoid tenderness. Tympanic membrane is not perforated, erythematous, retracted or bulging.     Nose:     Right Sinus: No maxillary sinus tenderness or frontal sinus tenderness.     Left Sinus: No maxillary sinus tenderness or frontal sinus tenderness.     Mouth/Throat:     Mouth: Mucous membranes are moist.     Pharynx: Uvula midline. No oropharyngeal exudate or posterior oropharyngeal erythema.     Tonsils: No tonsillar exudate.  Cardiovascular:     Rate and Rhythm: Normal rate and regular rhythm.     Heart sounds: Normal heart sounds.  Pulmonary:     Breath sounds: Normal breath sounds and air entry. No decreased breath sounds, wheezing, rhonchi or rales.  Chest:     Chest wall: No tenderness.  Abdominal:     General: Abdomen is flat. Bowel sounds are increased.     Tenderness: There is no abdominal tenderness. There is no guarding or rebound. Negative signs include Murphy's sign, Rovsing's sign and McBurney's sign.  Lymphadenopathy:     Cervical: No cervical adenopathy.  Skin:    Capillary Refill: Capillary refill takes less than 2 seconds.  Neurological:     General: No focal deficit present.     Mental Status: She is alert and oriented to person, place, and time.  Psychiatric:        Attention and Perception: Attention and perception normal.        Mood and Affect: Mood and affect normal.        Behavior: Behavior normal. Behavior is cooperative.  Thought Content: Thought content normal.        Judgment: Judgment normal.      UC Treatments / Results  Labs (all labs ordered are listed, but only abnormal results are displayed) Labs Reviewed  SARS CORONAVIRUS 2 (TAT 6-24 HRS)    EKG   Radiology No results found.  Procedures Procedures (including critical care time)  Medications Ordered in UC Medications - No data to display  Initial Impression / Assessment and  Plan / UC Course  I have reviewed the triage vital signs and the nursing notes.  Pertinent labs & imaging results that were available during my care of the patient were reviewed by me and considered in my medical decision making (see chart for details).     This patient is a 75 year old female presenting with viral gastroenteritis. Today she is  afebrile nontachycardic nontachypneic, oxygenating well on room air. Appears to be well hydrated.  Zofran and imodium sent. rec good hydration, BRAT diet.  Patient also with vague complaints of shortness of breath. History of asthma and COPD. She is  afebrile nontachycardic nontachypneic, oxygenating well on room air, no wheezes rhonchi or rales, no coughing or congestion; lungs clear throughout. I have a low suspicion for acute bronchitis or pneumonia. She has not used her albuterol inhaler; recommended using this as needed.   Covid PCR sent. isolation as per CDC guidelines.  Return precautions discussed.  This chart was dictated using voice recognition software, Dragon. Despite the best efforts of this provider to proofread and correct errors, errors may still occur which can change documentation meaning.   Final Clinical Impressions(s) / UC Diagnoses   Final diagnoses:  Viral gastroenteritis  Encounter for screening for COVID-19  COPD with asthma [facility])     Discharge Instructions     -For your diarrhea, start the Imodium (loperamide), 1 pill up to 4 times daily as needed for diarrhea. -If you develop nausea, I also sent a nausea medication-Zofran (ondansetron).  You can take this up to 3 times daily for nausea and vomiting. -Make sure to drink plenty of fluids and eat a bland diet as tolerated. -Come back and see [place] if you develop inability to hydrate by mouth, abdominal pain, new fever/chills, shortness of breath at rest, chest pain.  -Continue albuterol inhaler for COPD and asthma    ED Prescriptions    Medication Sig Dispense  Auth. Provider   ondansetron (ZOFRAN) 8 MG tablet Take 1 tablet (8 mg total) by mouth every 8 (eight) hours as needed for nausea or vomiting. 20 tablet Hazel Sams, PA-C   loperamide (IMODIUM) 2 MG capsule Take 1 capsule (2 mg total) by mouth 4 (four) times daily as needed for diarrhea or loose stools. 20 capsule Hazel Sams, PA-C     PDMP not reviewed this encounter.   Hazel Sams, PA-C 11/07/20 1411

## 2020-11-07 NOTE — ED Triage Notes (Signed)
Pt c/o diarrhea and SOB X 2 days. Pt states she thinks she was exposed to someone that has COVID.

## 2020-11-07 NOTE — Discharge Instructions (Addendum)
-  For your diarrhea, start the Imodium (loperamide), 1 pill up to 4 times daily as needed for diarrhea. -If you develop nausea, I also sent a nausea medication-Zofran (ondansetron).  You can take this up to 3 times daily for nausea and vomiting. -Make sure to drink plenty of fluids and eat a bland diet as tolerated. -Come back and see [place] if you develop inability to hydrate by mouth, abdominal pain, new fever/chills, shortness of breath at rest, chest pain.  -Continue albuterol inhaler for COPD and asthma

## 2020-11-13 DIAGNOSIS — E559 Vitamin D deficiency, unspecified: Secondary | ICD-10-CM | POA: Diagnosis not present

## 2020-11-13 DIAGNOSIS — I10 Essential (primary) hypertension: Secondary | ICD-10-CM | POA: Diagnosis not present

## 2020-11-13 DIAGNOSIS — D72819 Decreased white blood cell count, unspecified: Secondary | ICD-10-CM | POA: Diagnosis not present

## 2020-11-13 DIAGNOSIS — K219 Gastro-esophageal reflux disease without esophagitis: Secondary | ICD-10-CM | POA: Diagnosis not present

## 2020-11-13 DIAGNOSIS — Z0000 Encounter for general adult medical examination without abnormal findings: Secondary | ICD-10-CM | POA: Diagnosis not present

## 2020-12-02 DIAGNOSIS — Z79899 Other long term (current) drug therapy: Secondary | ICD-10-CM | POA: Diagnosis not present

## 2020-12-02 DIAGNOSIS — M353 Polymyalgia rheumatica: Secondary | ICD-10-CM | POA: Diagnosis not present

## 2020-12-02 DIAGNOSIS — D72819 Decreased white blood cell count, unspecified: Secondary | ICD-10-CM | POA: Diagnosis not present

## 2020-12-02 DIAGNOSIS — M170 Bilateral primary osteoarthritis of knee: Secondary | ICD-10-CM | POA: Diagnosis not present

## 2020-12-02 DIAGNOSIS — M0609 Rheumatoid arthritis without rheumatoid factor, multiple sites: Secondary | ICD-10-CM | POA: Diagnosis not present

## 2020-12-02 DIAGNOSIS — M79643 Pain in unspecified hand: Secondary | ICD-10-CM | POA: Diagnosis not present

## 2020-12-02 DIAGNOSIS — Z7952 Long term (current) use of systemic steroids: Secondary | ICD-10-CM | POA: Diagnosis not present

## 2020-12-02 DIAGNOSIS — M25512 Pain in left shoulder: Secondary | ICD-10-CM | POA: Diagnosis not present

## 2020-12-02 DIAGNOSIS — M1990 Unspecified osteoarthritis, unspecified site: Secondary | ICD-10-CM | POA: Diagnosis not present

## 2020-12-02 DIAGNOSIS — M810 Age-related osteoporosis without current pathological fracture: Secondary | ICD-10-CM | POA: Diagnosis not present

## 2021-02-19 ENCOUNTER — Other Ambulatory Visit: Payer: Self-pay | Admitting: Orthopedic Surgery

## 2021-02-19 DIAGNOSIS — R1032 Left lower quadrant pain: Secondary | ICD-10-CM

## 2021-02-19 DIAGNOSIS — M25552 Pain in left hip: Secondary | ICD-10-CM | POA: Diagnosis not present

## 2021-02-19 DIAGNOSIS — M545 Low back pain, unspecified: Secondary | ICD-10-CM | POA: Diagnosis not present

## 2021-02-19 DIAGNOSIS — S7001XA Contusion of right hip, initial encounter: Secondary | ICD-10-CM | POA: Diagnosis not present

## 2021-03-05 DIAGNOSIS — M353 Polymyalgia rheumatica: Secondary | ICD-10-CM | POA: Diagnosis not present

## 2021-03-05 DIAGNOSIS — M810 Age-related osteoporosis without current pathological fracture: Secondary | ICD-10-CM | POA: Diagnosis not present

## 2021-03-05 DIAGNOSIS — M0609 Rheumatoid arthritis without rheumatoid factor, multiple sites: Secondary | ICD-10-CM | POA: Diagnosis not present

## 2021-03-05 DIAGNOSIS — M79643 Pain in unspecified hand: Secondary | ICD-10-CM | POA: Diagnosis not present

## 2021-03-05 DIAGNOSIS — M25512 Pain in left shoulder: Secondary | ICD-10-CM | POA: Diagnosis not present

## 2021-03-05 DIAGNOSIS — M170 Bilateral primary osteoarthritis of knee: Secondary | ICD-10-CM | POA: Diagnosis not present

## 2021-03-05 DIAGNOSIS — Z7952 Long term (current) use of systemic steroids: Secondary | ICD-10-CM | POA: Diagnosis not present

## 2021-03-05 DIAGNOSIS — Z79899 Other long term (current) drug therapy: Secondary | ICD-10-CM | POA: Diagnosis not present

## 2021-03-05 DIAGNOSIS — M1990 Unspecified osteoarthritis, unspecified site: Secondary | ICD-10-CM | POA: Diagnosis not present

## 2021-03-05 DIAGNOSIS — M7582 Other shoulder lesions, left shoulder: Secondary | ICD-10-CM | POA: Diagnosis not present

## 2021-03-06 ENCOUNTER — Ambulatory Visit
Admission: RE | Admit: 2021-03-06 | Discharge: 2021-03-06 | Disposition: A | Discharge status: Home / Self Care | Payer: Medicare HMO | Source: Ambulatory Visit | Attending: Orthopedic Surgery | Admitting: Orthopedic Surgery

## 2021-03-06 ENCOUNTER — Other Ambulatory Visit: Payer: Self-pay

## 2021-03-06 DIAGNOSIS — R1032 Left lower quadrant pain: Secondary | ICD-10-CM

## 2021-03-06 DIAGNOSIS — R1030 Lower abdominal pain, unspecified: Secondary | ICD-10-CM | POA: Diagnosis not present

## 2021-03-17 ENCOUNTER — Other Ambulatory Visit: Payer: Self-pay | Admitting: Orthopedic Surgery

## 2021-03-17 DIAGNOSIS — R1032 Left lower quadrant pain: Secondary | ICD-10-CM

## 2021-03-19 DIAGNOSIS — M1990 Unspecified osteoarthritis, unspecified site: Secondary | ICD-10-CM | POA: Diagnosis not present

## 2021-03-19 DIAGNOSIS — M810 Age-related osteoporosis without current pathological fracture: Secondary | ICD-10-CM | POA: Diagnosis not present

## 2021-03-19 DIAGNOSIS — M79643 Pain in unspecified hand: Secondary | ICD-10-CM | POA: Diagnosis not present

## 2021-03-19 DIAGNOSIS — Z7952 Long term (current) use of systemic steroids: Secondary | ICD-10-CM | POA: Diagnosis not present

## 2021-03-19 DIAGNOSIS — M25512 Pain in left shoulder: Secondary | ICD-10-CM | POA: Diagnosis not present

## 2021-03-19 DIAGNOSIS — M7582 Other shoulder lesions, left shoulder: Secondary | ICD-10-CM | POA: Diagnosis not present

## 2021-03-19 DIAGNOSIS — M0609 Rheumatoid arthritis without rheumatoid factor, multiple sites: Secondary | ICD-10-CM | POA: Diagnosis not present

## 2021-03-19 DIAGNOSIS — Z79899 Other long term (current) drug therapy: Secondary | ICD-10-CM | POA: Diagnosis not present

## 2021-03-21 ENCOUNTER — Other Ambulatory Visit: Payer: Self-pay | Admitting: Rheumatology

## 2021-03-21 DIAGNOSIS — S43422A Sprain of left rotator cuff capsule, initial encounter: Secondary | ICD-10-CM

## 2021-03-27 ENCOUNTER — Ambulatory Visit (HOSPITAL_COMMUNITY)
Admission: EM | Admit: 2021-03-27 | Discharge: 2021-03-27 | Disposition: A | Discharge status: Home / Self Care | Payer: Medicare HMO | Attending: Emergency Medicine | Admitting: Emergency Medicine

## 2021-03-27 ENCOUNTER — Other Ambulatory Visit: Payer: Self-pay

## 2021-03-27 ENCOUNTER — Encounter (HOSPITAL_COMMUNITY): Payer: Self-pay | Admitting: Emergency Medicine

## 2021-03-27 DIAGNOSIS — M7582 Other shoulder lesions, left shoulder: Secondary | ICD-10-CM

## 2021-03-27 MED ORDER — DICLOFENAC SODIUM 1 % EX GEL: 2.0000 g | Freq: Four times a day (QID) | CUTANEOUS | 0 refills | Status: DC

## 2021-03-27 MED ORDER — LIDOCAINE 5 % EX PTCH: 1.0000 | MEDICATED_PATCH | CUTANEOUS | 0 refills | Status: DC

## 2021-03-27 NOTE — ED Provider Notes (Signed)
[place]    CSN: [id number] Arrival date & time: 03/27/21  1635      History   Chief Complaint Chief Complaint  Patient presents with   Arm Pain    Left      HPI Heather Ellis is a 75 y.o. female.   Patient here for ration of left shoulder pain that radiates down her arm to elbow.  Reports pain started on Sunday and has not improved.  Reports pain is constant and worse with movement and is now unable to move her arm.  Patient does have history of rheumatoid arthritis and was recently diagnosed with rotator cuff tendinitis in the left shoulder.  Patient has MRI scheduled for further evaluation of left shoulder pain.  Denies any trauma, injury, or other precipitating event.  Denies any fevers, chest pain, shortness of breath, N/V/D, numbness, tingling, weakness, abdominal pain, or headaches.     The history is provided by the patient.  Arm Pain   Past Medical History:  Diagnosis Date   Acid reflux    Anemia    Asthma    COPD (chronic obstructive pulmonary disease) ([place])    H/O polymyalgia rheumatica    History of COVID-19 2020   Hypertension    OA (osteoarthritis)    Obesity    Osteoporosis    Pre-diabetes    Syncope    Syncope and collapse 08/2018   Vitamin D deficiency     Patient Active Problem List   Diagnosis Date Noted   Primary osteoarthritis of hip 01/16/2020   Primary osteoarthritis of left hip 12/26/2019   Asthma 12/26/2019   Essential hypertension 10/10/2018   GERD (gastroesophageal reflux disease) 10/10/2018   Syncope and collapse 10/10/2018   AKI (acute kidney injury) ([place]) 10/10/2018   Syncope 09/22/2018   Post-menopause on HRT (hormone replacement therapy) 04/27/2016   Osteoporosis, unspecified 12/06/2013   Vaginal atrophy 12/06/2013    Past Surgical History:  Procedure Laterality Date   ABDOMINAL HYSTERECTOMY     TOTAL ABDOMINAL HYSTERECTOMY    COLONOSCOPY W/ BIOPSIES  2015   ESOPHAGOGASTRODUODENOSCOPY     OOPHORECTOMY      BSO   TOTAL HIP ARTHROPLASTY Left 01/16/2020   Procedure: TOTAL HIP ARTHROPLASTY ANTERIOR APPROACH;  Surgeon: Renette Butters, MD;  Location: [facility];  Service: Orthopedics;  Laterality: Left;    OB History     Gravida  6   Para  5   Term      Preterm      AB  1   Living  5      SAB  1   IAB      Ectopic      Multiple      Live Births               Home Medications    Prior to Admission medications   Medication Sig Start Date End Date Taking? Authorizing Provider  diclofenac Sodium (VOLTAREN) 1 % GEL Apply 2 g topically 4 (four) times daily. 03/27/21  Yes Pearson Forster, NP  lidocaine (LIDODERM) 5 % Place 1 patch onto the skin daily. Remove & Discard patch within 12 hours or as directed by MD 03/27/21  Yes Pearson Forster, NP  amLODipine (NORVASC) 10 MG tablet Take 10 mg by mouth daily. 09/13/19   [provider]  aspirin EC 81 MG tablet Take 1 tablet (81 mg total) by mouth 2 (two) times daily. For DVT prophylaxis for 30 days  after surgery. 01/16/20   Martensen, Charna Elizabeth III, PA-C  bisoprolol-hydrochlorothiazide (ZIAC) 5-6.25 MG tablet Take 2 tablets by mouth daily. 09/27/19   [provider]  cholecalciferol (VITAMIN D) 1000 UNITS tablet Take 1,000 Units by mouth daily.    [provider]  cyclobenzaprine (FLEXERIL) 10 MG tablet Take 10 mg by mouth 3 (three) times daily as needed for muscle spasms.    [provider]  docusate sodium (COLACE) 100 MG capsule Take 1 capsule (100 mg total) by mouth 2 (two) times daily. To prevent constipation while taking pain medication. 01/16/20   Martensen, Charna Elizabeth III, PA-C  feeding supplement, ENSURE ENLIVE, (ENSURE ENLIVE) LIQD Take 237 mLs by mouth 2 (two) times daily between meals. 10/11/18   Ghimire, Henreitta Leber, MD  folic acid (FOLVITE) 1 MG tablet Take 1 mg by mouth daily. 09/11/18   [provider]  HYDROcodone-acetaminophen (NORCO) 5-325 MG tablet Take 1-2 tablets by mouth  every 6 (six) hours as needed for severe pain. 01/16/20   Prudencio Burly III, PA-C  lisinopril (ZESTRIL) 40 MG tablet Take 40 mg by mouth daily. 12/21/19   [provider]  loperamide (IMODIUM) 2 MG capsule Take 1 capsule (2 mg total) by mouth 4 (four) times daily as needed for diarrhea or loose stools. 11/07/20   Hazel Sams, PA-C  methotrexate 2.5 MG tablet Take 20 mg by mouth every Monday. Takes 8 tablets    [provider]  omeprazole (PRILOSEC) 20 MG capsule Take 20 mg by mouth as needed.     [provider]  ondansetron (ZOFRAN) 8 MG tablet Take 1 tablet (8 mg total) by mouth every 8 (eight) hours as needed for nausea or vomiting. 11/07/20   Hazel Sams, PA-C  predniSONE (DELTASONE) 5 MG tablet Take 10 mg by mouth daily with breakfast.    [provider]    Family History Family History  Problem Relation Age of Onset   Cancer Mother        THROAT- SMOKER   Diabetes Mother    Seizures Mother    Syncope episode Mother    Diabetes Sister    Heart disease Maternal Aunt    Stroke Maternal Aunt    Cancer Maternal Uncle        LUNG -    Heart disease Maternal Uncle    Stroke Maternal Uncle    Ovarian cancer Maternal Grandmother    Heart attack Father    Heart disease Father    Syncope episode Brother     Social History Social History   Tobacco Use   Smoking status: Former    Types: Cigarettes    Quit date: 10/19/2014    Years since quitting: 6.4   Smokeless tobacco: Never  Vaping Use   Vaping Use: Never used  Substance Use Topics   Alcohol use: Not Currently    Alcohol/week: 0.0 standard drinks    Comment: occasional   Drug use: No     Allergies   Fosamax [alendronate sodium]   Review of Systems Review of Systems  Musculoskeletal:  Positive for arthralgias and joint swelling.  All other systems reviewed and are negative.   Physical Exam Triage Vital Signs ED Triage Vitals  Enc Vitals Group     BP 03/27/21  1715 (!) 183/88     Pulse Rate 03/27/21 1715 75     Resp 03/27/21 1715 17     Temp 03/27/21 1715 98.5 F (36.9 C)  Temp Source 03/27/21 1715 Oral     SpO2 03/27/21 1715 98 %     Weight --      Height --      Head Circumference --      Peak Flow --      Pain Score 03/27/21 1713 10     Pain Loc --      Pain Edu? --      Excl. in [place]? --    No data found.  Updated Vital Signs BP (!) 183/88 (BP Location: Right Arm)   Pulse 75   Temp 98.5 F (36.9 C) (Oral)   Resp 17   SpO2 98%   Visual Acuity Right Eye Distance:   Left Eye Distance:   Bilateral Distance:    Right Eye Near:   Left Eye Near:    Bilateral Near:     Physical Exam Vitals and nursing note reviewed.  Constitutional:      General: She is not in acute distress.    Appearance: Normal appearance. She is not ill-appearing, toxic-appearing or diaphoretic.  HENT:     Head: Normocephalic and atraumatic.  Eyes:     Conjunctiva/sclera: Conjunctivae normal.  Cardiovascular:     Rate and Rhythm: Normal rate.     Pulses: Normal pulses.  Pulmonary:     Effort: Pulmonary effort is normal.  Abdominal:     General: Abdomen is flat.  Musculoskeletal:     Right shoulder: Tenderness and bony tenderness present. No swelling. Decreased range of motion. Normal pulse.     Cervical back: Normal range of motion.  Skin:    General: Skin is warm and dry.  Neurological:     General: No focal deficit present.     Mental Status: She is alert and oriented to person, place, and time.  Psychiatric:        Mood and Affect: Mood normal.     UC Treatments / Results  Labs (all labs ordered are listed, but only abnormal results are displayed) Labs Reviewed - No data to display  EKG   Radiology No results found.  Procedures Procedures (including critical care time)  Medications Ordered in UC Medications - No data to display  Initial Impression / Assessment and Plan / UC Course  I have reviewed the triage vital signs  and the nursing notes.  Pertinent labs & imaging results that were available during my care of the patient were reviewed by me and considered in my medical decision making (see chart for details).    Assessment negative for red flags or concerns.  This is likely just inflammation from her rotator cuff tendinitis.  May try lidocaine patches and Voltaren gel.  Recommend continuing medications as previously prescribed.  Follow-up with primary care for further evaluation. Final Clinical Impressions(s) / UC Diagnoses   Final diagnoses:  Rotator cuff tendinitis, left     Discharge Instructions      You can use the Lidocaine patches and Voltaren gel as needed for shoulder pain.   Follow up with your primary care for further evaluation.       ED Prescriptions     Medication Sig Dispense Auth. Provider   lidocaine (LIDODERM) 5 % Place 1 patch onto the skin daily. Remove & Discard patch within 12 hours or as directed by MD 30 patch Pearson Forster, NP   diclofenac Sodium (VOLTAREN) 1 % GEL Apply 2 g topically 4 (four) times daily. 50 g Pearson Forster, NP  PDMP not reviewed this encounter.   Pearson Forster, NP 03/27/21 1827

## 2021-03-27 NOTE — ED Triage Notes (Signed)
Pt is present today with left shoulder pain that radiates down to the elbow. Pt states that her pain started Sunday. Pt denies any injury

## 2021-03-27 NOTE — Discharge Instructions (Addendum)
You can use the Lidocaine patches and Voltaren gel as needed for shoulder pain.   Follow up with your primary care for further evaluation.

## 2021-03-28 ENCOUNTER — Emergency Department (HOSPITAL_COMMUNITY): Payer: Medicare HMO

## 2021-03-28 ENCOUNTER — Encounter (HOSPITAL_COMMUNITY): Payer: Self-pay

## 2021-03-28 ENCOUNTER — Other Ambulatory Visit: Payer: Self-pay

## 2021-03-28 ENCOUNTER — Emergency Department (HOSPITAL_COMMUNITY)
Admission: EM | Admit: 2021-03-28 | Discharge: 2021-03-28 | Disposition: A | Discharge status: Home / Self Care | Payer: Medicare HMO | Attending: Emergency Medicine | Admitting: Emergency Medicine

## 2021-03-28 DIAGNOSIS — J45909 Unspecified asthma, uncomplicated: Secondary | ICD-10-CM | POA: Insufficient documentation

## 2021-03-28 DIAGNOSIS — Z8616 Personal history of COVID-19: Secondary | ICD-10-CM | POA: Diagnosis not present

## 2021-03-28 DIAGNOSIS — Z7982 Long term (current) use of aspirin: Secondary | ICD-10-CM | POA: Diagnosis not present

## 2021-03-28 DIAGNOSIS — R079 Chest pain, unspecified: Secondary | ICD-10-CM | POA: Diagnosis not present

## 2021-03-28 DIAGNOSIS — M19012 Primary osteoarthritis, left shoulder: Secondary | ICD-10-CM | POA: Diagnosis not present

## 2021-03-28 DIAGNOSIS — Z87891 Personal history of nicotine dependence: Secondary | ICD-10-CM | POA: Insufficient documentation

## 2021-03-28 DIAGNOSIS — J449 Chronic obstructive pulmonary disease, unspecified: Secondary | ICD-10-CM | POA: Insufficient documentation

## 2021-03-28 DIAGNOSIS — Z96642 Presence of left artificial hip joint: Secondary | ICD-10-CM | POA: Insufficient documentation

## 2021-03-28 DIAGNOSIS — I700 Atherosclerosis of aorta: Secondary | ICD-10-CM | POA: Diagnosis not present

## 2021-03-28 DIAGNOSIS — R55 Syncope and collapse: Secondary | ICD-10-CM | POA: Diagnosis not present

## 2021-03-28 DIAGNOSIS — I10 Essential (primary) hypertension: Secondary | ICD-10-CM

## 2021-03-28 DIAGNOSIS — R918 Other nonspecific abnormal finding of lung field: Secondary | ICD-10-CM

## 2021-03-28 DIAGNOSIS — R911 Solitary pulmonary nodule: Secondary | ICD-10-CM | POA: Diagnosis not present

## 2021-03-28 DIAGNOSIS — M25512 Pain in left shoulder: Secondary | ICD-10-CM

## 2021-03-28 LAB — BASIC METABOLIC PANEL
Anion gap: 10 (ref 5–15)
BUN: 5 mg/dL — ABNORMAL LOW (ref 8–23)
CO2: 29 mmol/L (ref 22–32)
Calcium: 9.5 mg/dL (ref 8.9–10.3)
Chloride: 96 mmol/L — ABNORMAL LOW (ref 98–111)
Creatinine, Ser: 0.63 mg/dL (ref 0.44–1.00)
GFR, Estimated: >60 mL/min (ref >60)
Glucose, Bld: 97 mg/dL (ref 70–99)
Potassium: 3.3 mmol/L — ABNORMAL LOW (ref 3.5–5.1)
Sodium: 135 mmol/L (ref 135–145)

## 2021-03-28 LAB — TROPONIN I (HIGH SENSITIVITY)
Troponin I (High Sensitivity): 10 ng/L (ref <18)
Troponin I (High Sensitivity): 10 ng/L (ref <18)

## 2021-03-28 LAB — CBC
HCT: 38.1 % (ref 36.0–46.0)
Hemoglobin: 12 g/dL (ref 12.0–15.0)
MCH: 29.6 pg (ref 26.0–34.0)
MCHC: 31.5 g/dL (ref 30.0–36.0)
MCV: 94.1 fL (ref 80.0–100.0)
Platelets: 244 10*3/uL (ref 150–400)
RBC: 4.05 MIL/uL (ref 3.87–5.11)
RDW: 15.4 % (ref 11.5–15.5)
WBC: 5.1 10*3/uL (ref 4.0–10.5)
nRBC: 0 % (ref 0.0–0.2)

## 2021-03-28 MED ORDER — AMLODIPINE BESYLATE 5 MG PO TABS
10.0000 mg | ORAL_TABLET | Freq: Once | ORAL | Status: AC
  Administered 2021-03-28: 10 mg via ORAL
  Filled 2021-03-28: qty 2

## 2021-03-28 MED ORDER — IOHEXOL 300 MG/ML  SOLN
75.0000 mL | Freq: Once | INTRAMUSCULAR | Status: AC | PRN
  Administered 2021-03-28: 75 mL via INTRAVENOUS

## 2021-03-28 MED ORDER — TRAMADOL HCL 50 MG PO TABS: 50.0000 mg | ORAL_TABLET | Freq: Three times a day (TID) | ORAL | 0 refills | Status: DC | PRN

## 2021-03-28 MED ORDER — TRAMADOL HCL 50 MG PO TABS
50.0000 mg | ORAL_TABLET | Freq: Once | ORAL | Status: AC
  Administered 2021-03-28: 50 mg via ORAL
  Filled 2021-03-28: qty 1

## 2021-03-28 NOTE — ED Notes (Signed)
Pt c/o pain in L arm. Asked Dr Ashok Cordia for order for pain med.

## 2021-03-28 NOTE — ED Notes (Signed)
ED MD aware of pt BP. Clear for d/c, to resume home doses of BP meds. Pt denies questions, verbalizes understanding of d/c instructions, called her daughter to pick up.

## 2021-03-28 NOTE — ED Provider Notes (Signed)
Heather Ellis   CSN: [id number] Arrival date & time: 03/28/21  8416     History Chief Complaint  Patient presents with   Shoulder Pain   Chest Pain    Heather Ellis is a 75 y.o. female.  Patient c/o left shoulder pain in the past week. Symptoms acute onset, moderate, constant, persistent, localized to left shoulder, worse w movement of shoulder. Denies specific injury or strain. No hx chronic shoulder pain. Denies fall. Pt is right hand dominant. Denies neck pain or radicular pain down arm. No numbness/weakness. No arm swelling. No skin changes, redness, rash or lesions.  Denies chest pain or sob - states shoulder pain gave her a twinge of left cp at rest last pm, lasting a couple seconds. No other current or recent chest pain or discomfort.  Recent went to UC for same, was given rx prednisone.  The history is provided by the patient and medical records.  Shoulder Pain Associated symptoms: no back pain, no fever and no neck pain   Chest Pain Associated symptoms: no abdominal pain, no back pain, no cough, no fever, no headache, no nausea, no numbness, no shortness of breath, no vomiting and no weakness       Past Medical History:  Diagnosis Date   Acid reflux    Anemia    Asthma    COPD (chronic obstructive pulmonary disease) ([place])    H/O polymyalgia rheumatica    History of COVID-19 2020   Hypertension    OA (osteoarthritis)    Obesity    Osteoporosis    Pre-diabetes    Syncope    Syncope and collapse 08/2018   Vitamin D deficiency     Patient Active Problem List   Diagnosis Date Noted   Primary osteoarthritis of hip 01/16/2020   Primary osteoarthritis of left hip 12/26/2019   Asthma 12/26/2019   Essential hypertension 10/10/2018   GERD (gastroesophageal reflux disease) 10/10/2018   Syncope and collapse 10/10/2018   AKI (acute kidney injury) ([place]) 10/10/2018   Syncope 09/22/2018   Post-menopause on HRT  (hormone replacement therapy) 04/27/2016   Osteoporosis, unspecified 12/06/2013   Vaginal atrophy 12/06/2013    Past Surgical History:  Procedure Laterality Date   ABDOMINAL HYSTERECTOMY     TOTAL ABDOMINAL HYSTERECTOMY    COLONOSCOPY W/ BIOPSIES  2015   ESOPHAGOGASTRODUODENOSCOPY     OOPHORECTOMY     BSO   TOTAL HIP ARTHROPLASTY Left 01/16/2020   Procedure: TOTAL HIP ARTHROPLASTY ANTERIOR APPROACH;  Surgeon: Renette Butters, MD;  Location: [facility];  Service: Orthopedics;  Laterality: Left;     OB History     Gravida  6   Para  5   Term      Preterm      AB  1   Living  5      SAB  1   IAB      Ectopic      Multiple      Live Births              Family History  Problem Relation Age of Onset   Cancer Mother        THROAT- SMOKER   Diabetes Mother    Seizures Mother    Syncope episode Mother    Diabetes Sister    Heart disease Maternal Aunt    Stroke Maternal Aunt    Cancer Maternal Uncle        LUNG -  Heart disease Maternal Uncle    Stroke Maternal Uncle    Ovarian cancer Maternal Grandmother    Heart attack Father    Heart disease Father    Syncope episode Brother     Social History   Tobacco Use   Smoking status: Former    Types: Cigarettes    Quit date: 10/19/2014    Years since quitting: 6.4   Smokeless tobacco: Never  Vaping Use   Vaping Use: Never used  Substance Use Topics   Alcohol use: Not Currently    Alcohol/week: 0.0 standard drinks    Comment: occasional   Drug use: No    Home Medications Prior to Admission medications   Medication Sig Start Date End Date Taking? Authorizing Provider  amLODipine (NORVASC) 10 MG tablet Take 10 mg by mouth daily. 09/13/19   [provider]  aspirin EC 81 MG tablet Take 1 tablet (81 mg total) by mouth 2 (two) times daily. For DVT prophylaxis for 30 days after surgery. 01/16/20   Martensen, Charna Elizabeth III, PA-C  bisoprolol-hydrochlorothiazide (ZIAC) 5-6.25 MG tablet Take 2  tablets by mouth daily. 09/27/19   [provider]  cholecalciferol (VITAMIN D) 1000 UNITS tablet Take 1,000 Units by mouth daily.    [provider]  cyclobenzaprine (FLEXERIL) 10 MG tablet Take 10 mg by mouth 3 (three) times daily as needed for muscle spasms.    [provider]  diclofenac Sodium (VOLTAREN) 1 % GEL Apply 2 g topically 4 (four) times daily. 03/27/21   Pearson Forster, NP  docusate sodium (COLACE) 100 MG capsule Take 1 capsule (100 mg total) by mouth 2 (two) times daily. To prevent constipation while taking pain medication. 01/16/20   Martensen, Charna Elizabeth III, PA-C  feeding supplement, ENSURE ENLIVE, (ENSURE ENLIVE) LIQD Take 237 mLs by mouth 2 (two) times daily between meals. 10/11/18   Ghimire, Henreitta Leber, MD  folic acid (FOLVITE) 1 MG tablet Take 1 mg by mouth daily. 09/11/18   [provider]  HYDROcodone-acetaminophen (NORCO) 5-325 MG tablet Take 1-2 tablets by mouth every 6 (six) hours as needed for severe pain. 01/16/20   Prudencio Burly III, PA-C  lidocaine (LIDODERM) 5 % Place 1 patch onto the skin daily. Remove & Discard patch within 12 hours or as directed by MD 03/27/21   Pearson Forster, NP  lisinopril (ZESTRIL) 40 MG tablet Take 40 mg by mouth daily. 12/21/19   [provider]  loperamide (IMODIUM) 2 MG capsule Take 1 capsule (2 mg total) by mouth 4 (four) times daily as needed for diarrhea or loose stools. 11/07/20   Hazel Sams, PA-C  methotrexate 2.5 MG tablet Take 20 mg by mouth every Monday. Takes 8 tablets    [provider]  omeprazole (PRILOSEC) 20 MG capsule Take 20 mg by mouth as needed.     [provider]  ondansetron (ZOFRAN) 8 MG tablet Take 1 tablet (8 mg total) by mouth every 8 (eight) hours as needed for nausea or vomiting. 11/07/20   Hazel Sams, PA-C  predniSONE (DELTASONE) 5 MG tablet Take 10 mg by mouth daily with breakfast.    [provider]    Allergies    Fosamax  [alendronate sodium]  Review of Systems   Review of Systems  Constitutional:  Negative for chills and fever.  HENT:  Negative for sore throat.   Eyes:  Negative for redness.  Respiratory:  Negative for cough and shortness of breath.  Cardiovascular:  Negative for leg swelling.  Gastrointestinal:  Negative for abdominal pain, nausea and vomiting.  Genitourinary:  Negative for flank pain.  Musculoskeletal:  Negative for back pain and neck pain.  Skin:  Negative for rash.  Neurological:  Negative for weakness, numbness and headaches.  Hematological:  Does not bruise/bleed easily.  Psychiatric/Behavioral:  Negative for confusion.    Physical Exam Updated Vital Signs BP (!) 179/103   Pulse 79   Temp 98.1 F (36.7 C) (Oral)   Resp 18   Ht 1.6 m (5\' 3" )   Wt 65.8 kg   SpO2 98%   BMI 25.69 kg/m   Physical Exam Vitals and nursing Ellis reviewed.  Constitutional:      Appearance: Normal appearance. She is well-developed.  HENT:     Head: Atraumatic.     Nose: Nose normal.     Mouth/Throat:     Mouth: Mucous membranes are moist.  Eyes:     General: No scleral icterus.    Conjunctiva/sclera: Conjunctivae normal.  Neck:     Trachea: No tracheal deviation.  Cardiovascular:     Rate and Rhythm: Normal rate and regular rhythm.     Pulses: Normal pulses.     Heart sounds: Normal heart sounds. No murmur heard.   No friction rub. No gallop.  Pulmonary:     Effort: Pulmonary effort is normal. No respiratory distress.     Breath sounds: Normal breath sounds.  Chest:     Chest wall: No tenderness.  Abdominal:     General: Bowel sounds are normal. There is no distension.     Palpations: Abdomen is soft.     Tenderness: There is no abdominal tenderness. There is no guarding.  Genitourinary:    Comments: No cva tenderness.  Musculoskeletal:        General: No swelling.     Cervical back: Normal range of motion and neck supple. No rigidity. No muscular tenderness.     Comments:  Left shoulder tenderness anteriorly, including at right [facility] region. No sts noted. No erythema or skin changes. Good slow passive rom shoulder without pain - no findings of septic joint. Radial pulse 2+. No LUE swelling. C/T spine non tender, aligned, no step off, no mass.   Skin:    General: Skin is warm and dry.     Findings: No rash.  Neurological:     Mental Status: She is alert.     Comments: Alert, speech normal. LUE motor intact, stre 5/5. Sens grossly intact.   Psychiatric:        Mood and Affect: Mood normal.    ED Results / Procedures / Treatments   Labs (all labs ordered are listed, but only abnormal results are displayed) Results for orders placed or performed during the hospital encounter of 16/10/96  Basic metabolic panel  Result Value Ref Range   Sodium 135 135 - 145 mmol/L   Potassium 3.3 (L) 3.5 - 5.1 mmol/L   Chloride 96 (L) 98 - 111 mmol/L   CO2 29 22 - 32 mmol/L   Glucose, Bld 97 70 - 99 mg/dL   BUN 5 (L) 8 - 23 mg/dL   Creatinine, Ser 0.63 0.44 - 1.00 mg/dL   Calcium 9.5 8.9 - 10.3 mg/dL   GFR, Estimated >60 >60 mL/min   Anion gap 10 5 - 15  CBC  Result Value Ref Range   WBC 5.1 4.0 - 10.5 K/uL   RBC 4.05 3.87 - 5.11 MIL/uL  Hemoglobin 12.0 12.0 - 15.0 g/dL   HCT 38.1 36.0 - 46.0 %   MCV 94.1 80.0 - 100.0 fL   MCH 29.6 26.0 - 34.0 pg   MCHC 31.5 30.0 - 36.0 g/dL   RDW 15.4 11.5 - 15.5 %   Platelets 244 150 - 400 K/uL   nRBC 0.0 0.0 - 0.2 %  Troponin I (High Sensitivity)  Result Value Ref Range   Troponin I (High Sensitivity) 10 <18 ng/L  Troponin I (High Sensitivity)  Result Value Ref Range   Troponin I (High Sensitivity) 10 <18 ng/L   DG Chest 2 View  Result Date: 03/28/2021 CLINICAL DATA:  Left chest and shoulder pain since last Sunday, pain has gotten worse as week has went on - no known injury - hx of exsmoker, asthma, COPD, htn, COVID, acid reflux EXAM: CHEST - 2 VIEW COMPARISON:  none FINDINGS: 1.6 cm nodule projects in the mid right lung, not  localized on the lateral projection. Left lung clear. Heart size and mediastinal contours are within normal limits. Aortic Atherosclerosis (ICD10-170.0). No effusion.  No pneumothorax. Visualized bones unremarkable. IMPRESSION: 1.6 cm right lung nodule. Recommend CT chest with contrast for further characterization. Electronically Signed   By: Lucrezia Europe M.D.   On: 03/28/2021 10:00   CT Chest W Contrast  Result Date: 03/28/2021 CLINICAL DATA:  Evaluate pulmonary nodule EXAM: CT CHEST WITH CONTRAST TECHNIQUE: Multidetector CT imaging of the chest was performed during intravenous contrast administration. CONTRAST:  9mL OMNIPAQUE IOHEXOL 300 MG/ML  SOLN COMPARISON:  He plain film radiograph from earlier today. FINDINGS: Cardiovascular: Normal heart size. No pericardial effusion. Aortic atherosclerosis. Mediastinum/Nodes: No enlarged mediastinal, hilar, or axillary lymph nodes. Thyroid gland, trachea, and esophagus demonstrate no significant findings. Lungs/Pleura: No pleural effusion. No airspace consolidation, atelectasis or pneumothorax. Within the periphery of the right upper lobe there is a 1.8 cm nodule with spiculated margins, image 60/4. Within the right lower lobe there is a 5 mm nodule, image 82/4. Peripheral ground-glass attenuating nodule within the left upper lobe measures 0.9 cm, image 34/4. Additional tiny less than 5 mm nodules are scattered within both lungs with a nonspecific appearance Upper Abdomen: No acute abnormality within the imaged portions of the upper abdomen. Musculoskeletal: No chest wall abnormality. No acute or significant osseous findings. IMPRESSION: 1. There is a 1.8 cm spiculated nodule with spiculated margins within the periphery of the right upper lobe. Suspicious for primary bronchogenic carcinoma. Recommend referral to thoracic surgery or pulmonary medicine for further management. 2. Nonspecific ground-glass attenuating nodule in the left upper lobe measures 0.9 cm. Attention  to this nodule on subsequent imaging is advised. 3. Additional small nonspecific pulmonary nodules are scattered within both lungs measuring up to 5 mm and are nonspecific. 4. Aortic atherosclerosis. Aortic Atherosclerosis (ICD10-I70.0). Electronically Signed   By: Kerby Moors M.D.   On: 03/28/2021 13:51   US PELVIS LIMITED (TRANSABDOMINAL ONLY)  Result Date: 03/07/2021 CLINICAL DATA:  Left groin pain.  Evaluate for femoral hernia. EXAM: US PELVIS LIMITED TECHNIQUE: Ultrasound examination of the pelvic soft tissues was performed in the area of clinical concern. COMPARISON:  Pelvic CT 05/11/2018 FINDINGS: Imaging was targeted to the left groin and suprapubic area where the patient has focal tenderness. There is an asymmetric ill-defined hypoechoic area in this region which measures 3.1 x 1.1 x 1.6 cm. This demonstrates no abnormal blood flow or reduction with transducer pressure. Assessment for peristalsis limited by transducer movement. Normal size inguinal lymph nodes are  noted bilaterally. IMPRESSION: Indeterminate hypoechoic area in the left groin at the area of the patient's pain could reflect a hernia, although is suboptimally localized by ultrasound. Consider further evaluation with pelvic CT. Electronically Signed   By: Richardean Sale M.D.   On: 03/07/2021 14:05   DG Shoulder Left  Result Date: 03/28/2021 CLINICAL DATA:  Left shoulder pain EXAM: LEFT SHOULDER - 2+ VIEW COMPARISON:  None. FINDINGS: No acute fracture or dislocation. Mild degenerative changes of the left shoulder and acromioclavicular joint. Visualized lung is clear. IMPRESSION: No acute osseous abnormalities. Mild degenerative changes of the left shoulder and acromioclavicular joints. Electronically Signed   By: Yetta Glassman MD   On: 03/28/2021 12:14     EKG EKG Interpretation  Date/Time:  Friday March 28 2021 11:30:35 EDT Ventricular Rate:  77 PR Interval:  167 QRS Duration: 74 QT Interval:  424 QTC Calculation: 480 R  Axis:   71 Text Interpretation: Sinus rhythm Atrial premature complex Consider left ventricular hypertrophy Confirmed by Lajean Saver [phone]) on 03/28/2021 1:41:59 PM  Radiology DG Chest 2 View  Result Date: 03/28/2021 CLINICAL DATA:  Left chest and shoulder pain since last Sunday, pain has gotten worse as week has went on - no known injury - hx of exsmoker, asthma, COPD, htn, COVID, acid reflux EXAM: CHEST - 2 VIEW COMPARISON:  none FINDINGS: 1.6 cm nodule projects in the mid right lung, not localized on the lateral projection. Left lung clear. Heart size and mediastinal contours are within normal limits. Aortic Atherosclerosis (ICD10-170.0). No effusion.  No pneumothorax. Visualized bones unremarkable. IMPRESSION: 1.6 cm right lung nodule. Recommend CT chest with contrast for further characterization. Electronically Signed   By: Lucrezia Europe M.D.   On: 03/28/2021 10:00   CT Chest W Contrast  Result Date: 03/28/2021 CLINICAL DATA:  Evaluate pulmonary nodule EXAM: CT CHEST WITH CONTRAST TECHNIQUE: Multidetector CT imaging of the chest was performed during intravenous contrast administration. CONTRAST:  41mL OMNIPAQUE IOHEXOL 300 MG/ML  SOLN COMPARISON:  He plain film radiograph from earlier today. FINDINGS: Cardiovascular: Normal heart size. No pericardial effusion. Aortic atherosclerosis. Mediastinum/Nodes: No enlarged mediastinal, hilar, or axillary lymph nodes. Thyroid gland, trachea, and esophagus demonstrate no significant findings. Lungs/Pleura: No pleural effusion. No airspace consolidation, atelectasis or pneumothorax. Within the periphery of the right upper lobe there is a 1.8 cm nodule with spiculated margins, image 60/4. Within the right lower lobe there is a 5 mm nodule, image 82/4. Peripheral ground-glass attenuating nodule within the left upper lobe measures 0.9 cm, image 34/4. Additional tiny less than 5 mm nodules are scattered within both lungs with a nonspecific appearance Upper Abdomen: No  acute abnormality within the imaged portions of the upper abdomen. Musculoskeletal: No chest wall abnormality. No acute or significant osseous findings. IMPRESSION: 1. There is a 1.8 cm spiculated nodule with spiculated margins within the periphery of the right upper lobe. Suspicious for primary bronchogenic carcinoma. Recommend referral to thoracic surgery or pulmonary medicine for further management. 2. Nonspecific ground-glass attenuating nodule in the left upper lobe measures 0.9 cm. Attention to this nodule on subsequent imaging is advised. 3. Additional small nonspecific pulmonary nodules are scattered within both lungs measuring up to 5 mm and are nonspecific. 4. Aortic atherosclerosis. Aortic Atherosclerosis (ICD10-I70.0). Electronically Signed   By: Kerby Moors M.D.   On: 03/28/2021 13:51   DG Shoulder Left  Result Date: 03/28/2021 CLINICAL DATA:  Left shoulder pain EXAM: LEFT SHOULDER - 2+ VIEW COMPARISON:  None. FINDINGS: No acute  fracture or dislocation. Mild degenerative changes of the left shoulder and acromioclavicular joint. Visualized lung is clear. IMPRESSION: No acute osseous abnormalities. Mild degenerative changes of the left shoulder and acromioclavicular joints. Electronically Signed   By: Yetta Glassman MD   On: 03/28/2021 12:14    Procedures Procedures   Medications Ordered in ED Medications  traMADol (ULTRAM) tablet 50 mg (has no administration in time range)    ED Course  I have reviewed the triage vital signs and the nursing notes.  Pertinent labs & imaging results that were available during my care of the patient were reviewed by me and considered in my medical decision making (see chart for details).    MDM Rules/Calculators/A&P                          Labs and xrays ordered by staff.   Reviewed nursing notes and prior charts for additional history.   Xrays reviewed/interpreted by me - shoulder w no fx. Incidental Ellis of chest nodule on cxr - CT  obtained - spiculated 1.8 cm lesion and other nodules noted - discussed w pt, concern for possible lung cancer, and need for close outpatient follow up to rule in or out lung cancer - pt voiced understanding.  Labs reviewed/interpreted by me - trop normal. 2-3 seconds cp/single 'twinge' of cp felt not c/w acs.   Pt requests something for pain - did not drive, has a ride. Ultram po.  BP is high, pt notes hx same. Indicates has not yet taken her bp meds today - will provide.  BP improved on recheck. No headache, no cp, no sob.   Pt currently appears stable for d/c w close outpt f/u plan.      Final Clinical Impression(s) / ED Diagnoses Final diagnoses:  None    Rx / DC Orders ED Discharge Orders     None        Lajean Saver, MD 03/28/21 1431

## 2021-03-28 NOTE — ED Notes (Signed)
Sent troponin, re-vitaled, EKG monitor in place. Pt to xray

## 2021-03-28 NOTE — ED Notes (Signed)
Pt returned from X-ray.  

## 2021-03-28 NOTE — Discharge Instructions (Addendum)
It was our pleasure to provide your ER care today - we hope that you feel better.  Your xrays/scans were read as showing  1. There is a 1.8 cm spiculated nodule with spiculated margins within the periphery of the right upper lobe. Suspicious for primary bronchogenic cancer. Recommend referral to thoracic surgery or pulmonary medicine for further management. 2. Non- specific ground-glass attenuating nodule in the left upper lobe measures 0.9 cm. Attention to this nodule on subsequent imaging is advised. 3. Additional small nonspecific pulmonary nodules are scattered within both lungs measuring up to 5 mm and are nonspecific.   Regarding the lung nodules noted above - you MUST follow up with lung specialist in the next 1-2 weeks - call office Monday AM to arrange appointment.  Also discuss above results with your primary care doctor this week, so that they can help coordinate your follow up care.  Your blood pressure is high today - continue your meds, and follow up with primary care doctor in 1 week.   For shoulder pain, take ibuprofen or acetaminophen as need. You may also take ultram as need for pain - no driving for the next 6 hours, or if taking ultram.  Follow up with orthopedic doctor in the next 1-2 weeks - call office to arrange appointment.   Return to ER if worse, new symptoms, fevers, new/severe pain, numbness/weakness, chest pain, trouble breathing, or other concern.

## 2021-03-28 NOTE — ED Triage Notes (Signed)
Pt reports left shoulder pain that started Sunday, the pain now radiates to her chest and back. Pt was seen at [facility] yesterday. Denies any fall or trauma to shoulder. Pt unable to raise her arm up due to the pain. Denies sob or dizziness.

## 2021-04-02 ENCOUNTER — Ambulatory Visit
Admission: RE | Admit: 2021-04-02 | Discharge: 2021-04-02 | Disposition: A | Discharge status: Home / Self Care | Payer: Medicare HMO | Source: Ambulatory Visit | Attending: Orthopedic Surgery | Admitting: Orthopedic Surgery

## 2021-04-02 DIAGNOSIS — I700 Atherosclerosis of aorta: Secondary | ICD-10-CM | POA: Diagnosis not present

## 2021-04-02 DIAGNOSIS — R1032 Left lower quadrant pain: Secondary | ICD-10-CM

## 2021-04-02 DIAGNOSIS — K4020 Bilateral inguinal hernia, without obstruction or gangrene, not specified as recurrent: Secondary | ICD-10-CM | POA: Diagnosis not present

## 2021-04-02 DIAGNOSIS — Z90710 Acquired absence of both cervix and uterus: Secondary | ICD-10-CM | POA: Diagnosis not present

## 2021-04-02 DIAGNOSIS — Z96642 Presence of left artificial hip joint: Secondary | ICD-10-CM | POA: Diagnosis not present

## 2021-04-02 MED ORDER — IOPAMIDOL (ISOVUE-300) INJECTION 61%
100.0000 mL | Freq: Once | INTRAVENOUS | Status: AC | PRN
  Administered 2021-04-02: 100 mL via INTRAVENOUS

## 2021-04-04 ENCOUNTER — Ambulatory Visit: Payer: Medicare HMO | Admitting: Emergency Medicine

## 2021-04-04 ENCOUNTER — Encounter: Payer: Self-pay | Admitting: Emergency Medicine

## 2021-04-04 ENCOUNTER — Other Ambulatory Visit: Payer: Self-pay

## 2021-04-04 DIAGNOSIS — R911 Solitary pulmonary nodule: Secondary | ICD-10-CM | POA: Insufficient documentation

## 2021-04-04 DIAGNOSIS — J4520 Mild intermittent asthma, uncomplicated: Secondary | ICD-10-CM

## 2021-04-04 LAB — PROTIME-INR
INR: 1.1 ratio — ABNORMAL HIGH (ref 0.8–1.0)
Prothrombin Time: 12.3 s (ref 9.6–13.1)

## 2021-04-04 NOTE — Assessment & Plan Note (Signed)
Carries a long history of asthma and has albuterol that she can use as needed.  Does not appear that she is on scheduled bronchodilator therapy although she tells me she does have 2 inhalers at home, question what the other inhaler is.  She needs pulmonary function testing to quantify her degree of obstruction and may benefit from scheduled therapy.  We will review, consider starting ICS/LABA next time.

## 2021-04-04 NOTE — Patient Instructions (Addendum)
We will perform a PET scan to further evaluate your right upper lobe pulmonary nodule We will check blood work today We will perform pulmonary function testing at your next office visit. Follow with Dr Lamonte Sakai next available after your PET scan so that we can review the results together.

## 2021-04-04 NOTE — Assessment & Plan Note (Signed)
Spiculated right upper lobe pulmonary nodule that was found spuriously when she was being evaluated for shoulder pain.  The appearance is quite concerning for malignancy.  She has a minimal tobacco history.  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  We will perform PET scan to further evaluate the nodule, look for any lymphadenopathy or distant disease.  She has scattered other pulmonary nodules but they are all small and below the sensitivity threshold of PET.  I suspect ultimately she will need navigational bronchoscopy to sample the lesion.  I did discuss the role for bronchoscopy with her today.  I will check an INR.  Follow-up ASAP after the PET scan to discuss next steps including possible diagnostics.

## 2021-04-04 NOTE — Progress Notes (Signed)
Subjective:    Patient ID: Heather Ellis, female    DOB: February 24, 1946, 75 y.o.   MRN: [id number]  HPI 75 year old former smoker (10 pack years) with a history of obesity, prediabetes, polymyalgia rheumatica, hypertension, GERD.  She carries a history of COPD/asthma that was made in the early 80's. She is on pred 10mg  for her joint pain. She has albuterol, uses rarely when she has SOB. She has exertional SOB, is able to shop at slower pace. Can do some housework. Has trouble with stairs. She has minimal cough, no sputum.   She is referred today for evaluation of an abnormal CT scan of the chest.  She was seen in urgent care 03/28/2021 for shoulder pain.  Chest x-ray showed a suspected pulmonary nodule prompting CT scan of the chest.   Chest x-ray 03/28/2021 reviewed by me shows a 1.6 cm right midlung pulmonary nodule  CT scan of the chest 03/28/2021 reviewed by me, shows no evidence for mediastinal or hilar lymphadenopathy, and a 1.8 cm right upper lobe pulmonary nodule with spiculated margins.  There is a 5 mm right lower lobe nodule, 9 mm groundglass left upper lobe nodule.  Several scattered smaller nodules as well.   Review of Systems As per HPI  Past Medical History:  Diagnosis Date   Acid reflux    Anemia    Asthma    COPD (chronic obstructive pulmonary disease) ([place])    H/O polymyalgia rheumatica    History of COVID-19 2020   Hypertension    OA (osteoarthritis)    Obesity    Osteoporosis    Pre-diabetes    Syncope    Syncope and collapse 08/2018   Vitamin D deficiency      Family History  Problem Relation Age of Onset   Cancer Mother        THROAT- SMOKER   Diabetes Mother    Seizures Mother    Syncope episode Mother    Diabetes Sister    Heart disease Maternal Aunt    Stroke Maternal Aunt    Cancer Maternal Uncle        LUNG -    Heart disease Maternal Uncle    Stroke Maternal Uncle    Ovarian cancer Maternal Grandmother    Heart attack Father    Heart  disease Father    Syncope episode Brother     Mother > throat CA No lung CA in 1st degree relatives  Social History   Socioeconomic History   Marital status: Divorced    Spouse name: Not on file   Number of children: Not on file   Years of education: Not on file   Highest education level: Not on file  Occupational History   Not on file  Tobacco Use   Smoking status: Former    Types: Cigarettes    Quit date: 10/19/2014    Years since quitting: 6.4   Smokeless tobacco: Never  Vaping Use   Vaping Use: Never used  Substance and Sexual Activity   Alcohol use: Not Currently    Alcohol/week: 0.0 standard drinks    Comment: occasional   Drug use: No   Sexual activity: Never    Birth control/protection: Surgical    Comment: 1st intercourse 72 yo-1 partner  Other Topics Concern   Not on file  Social History Narrative   Not on file   Social Determinants of Health   Financial Resource Strain: Not on file  Food Insecurity: Not on file  Transportation Needs: Not on file  Physical Activity: Not on file  Stress: Not on file  Social Connections: Not on file  Intimate Partner Violence: Not on file    [place] native Has worked at [employer], in healthcare as a housekeeping. Exposed to some cleaning solutions. Owns a dog, never birds No known mold exposure  Allergies  Allergen Reactions   Fosamax [Alendronate Sodium] Nausea And Vomiting     Outpatient Medications Prior to Visit  Medication Sig Dispense Refill   amLODipine (NORVASC) 5 MG tablet Take 5 mg by mouth daily. Take 1 tablet by mouth once daily     aspirin EC 81 MG tablet Take 1 tablet (81 mg total) by mouth 2 (two) times daily. For DVT prophylaxis for 30 days after surgery. 60 tablet 0   bisoprolol-hydrochlorothiazide (ZIAC) 5-6.25 MG tablet Take 2 tablets by mouth daily.     cholecalciferol (VITAMIN D) 1000 UNITS tablet Take 1,000 Units by mouth daily.     cyclobenzaprine (FLEXERIL) 10 MG tablet Take 10 mg by mouth 3  (three) times daily as needed for muscle spasms.     folic acid (FOLVITE) 1 MG tablet Take 1 mg by mouth daily.     lisinopril (ZESTRIL) 40 MG tablet Take 40 mg by mouth daily.     methotrexate 2.5 MG tablet Take 20 mg by mouth every Monday. Takes 8 tablets     omeprazole (PRILOSEC) 20 MG capsule Take 20 mg by mouth as needed.      predniSONE (DELTASONE) 5 MG tablet Take 10 mg by mouth daily with breakfast.     traMADol (ULTRAM) 50 MG tablet Take 1 tablet (50 mg total) by mouth every 8 (eight) hours as needed. 15 tablet 0   diclofenac Sodium (VOLTAREN) 1 % GEL Apply 2 g topically 4 (four) times daily. (Patient not taking: Reported on 04/04/2021) 50 g 0   docusate sodium (COLACE) 100 MG capsule Take 1 capsule (100 mg total) by mouth 2 (two) times daily. To prevent constipation while taking pain medication. (Patient not taking: No sig reported) 30 capsule 0   feeding supplement, ENSURE ENLIVE, (ENSURE ENLIVE) LIQD Take 237 mLs by mouth 2 (two) times daily between meals. (Patient not taking: No sig reported) 60 Bottle 0   HYDROcodone-acetaminophen (NORCO) 5-325 MG tablet Take 1-2 tablets by mouth every 6 (six) hours as needed for severe pain. (Patient not taking: No sig reported) 30 tablet 0   lidocaine (LIDODERM) 5 % Place 1 patch onto the skin daily. Remove & Discard patch within 12 hours or as directed by MD 30 patch 0   loperamide (IMODIUM) 2 MG capsule Take 1 capsule (2 mg total) by mouth 4 (four) times daily as needed for diarrhea or loose stools. (Patient not taking: Reported on 04/04/2021) 20 capsule 0   ondansetron (ZOFRAN) 8 MG tablet Take 1 tablet (8 mg total) by mouth every 8 (eight) hours as needed for nausea or vomiting. (Patient not taking: No sig reported) 20 tablet 0   No facility-administered medications prior to visit.         Objective:   Physical Exam Vitals:   04/04/21 1048  BP: 130/88  Pulse: (!) 53  Temp: 98.4 F (36.9 C)  SpO2: 97%    Gen: Pleasant elderly woman,  overweight,  in no distress,  normal affect  ENT: No lesions,  mouth clear,  oropharynx clear, no postnasal drip  Neck: No JVD, no stridor  Lungs: No use of accessory muscles,  somewhat small breaths but no crackles or wheezing on normal respiration, no wheeze on forced expiration  Cardiovascular: RRR, heart sounds normal, no murmur or gallops, no peripheral edema  Musculoskeletal: No deformities, no cyanosis or clubbing  Neuro: alert, awake, non focal  Skin: Warm, no lesions or rash      Assessment & Plan:  Pulmonary nodule 1 cm or greater in diameter Spiculated right upper lobe pulmonary nodule that was found spuriously when she was being evaluated for shoulder pain.  The appearance is quite concerning for malignancy.  She has a minimal tobacco history.  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  We will perform PET scan to further evaluate the nodule, look for any lymphadenopathy or distant disease.  She has scattered other pulmonary nodules but they are all small and below the sensitivity threshold of PET.  I suspect ultimately she will need navigational bronchoscopy to sample the lesion.  I did discuss the role for bronchoscopy with her today.  I will check an INR.  Follow-up ASAP after the PET scan to discuss next steps including possible diagnostics.  Asthma Carries a long history of asthma and has albuterol that she can use as needed.  Does not appear that she is on scheduled bronchodilator therapy although she tells me she does have 2 inhalers at home, question what the other inhaler is.  She needs pulmonary function testing to quantify her degree of obstruction and may benefit from scheduled therapy.  We will review, consider starting ICS/LABA next time.   Baltazar Apo, MD, PhD 04/04/2021, 11:18 AM [facility] [phone] or if no answer before 7:00PM call  [phone] For any issues after 7:00PM please call eLink [phone]

## 2021-04-04 NOTE — Addendum Note (Signed)
Addended by: Suzzanne Cloud E on: 04/04/2021 11:36 AM   Modules accepted: Orders

## 2021-04-07 LAB — QUANTIFERON-TB GOLD PLUS
Mitogen-NIL: >10 IU/mL
NIL: 0.02 IU/mL
QuantiFERON-TB Gold Plus: NEGATIVE
TB1-NIL: 0.01 IU/mL
TB2-NIL: 0.01 IU/mL

## 2021-04-11 ENCOUNTER — Other Ambulatory Visit: Payer: Self-pay

## 2021-04-11 ENCOUNTER — Ambulatory Visit
Admission: RE | Admit: 2021-04-11 | Discharge: 2021-04-11 | Disposition: A | Discharge status: Home / Self Care | Payer: Medicare HMO | Source: Ambulatory Visit | Attending: Rheumatology | Admitting: Rheumatology

## 2021-04-11 DIAGNOSIS — M19012 Primary osteoarthritis, left shoulder: Secondary | ICD-10-CM | POA: Diagnosis not present

## 2021-04-11 DIAGNOSIS — M75122 Complete rotator cuff tear or rupture of left shoulder, not specified as traumatic: Secondary | ICD-10-CM | POA: Diagnosis not present

## 2021-04-11 DIAGNOSIS — S43422A Sprain of left rotator cuff capsule, initial encounter: Secondary | ICD-10-CM

## 2021-04-11 MED ORDER — GADOBENATE DIMEGLUMINE 529 MG/ML IV SOLN
15.0000 mL | Freq: Once | INTRAVENOUS | Status: AC | PRN
  Administered 2021-04-11: 15 mL via INTRAVENOUS

## 2021-04-17 ENCOUNTER — Ambulatory Visit (HOSPITAL_COMMUNITY)
Admission: RE | Admit: 2021-04-17 | Discharge: 2021-04-17 | Disposition: A | Discharge status: Home / Self Care | Payer: Medicare HMO | Source: Ambulatory Visit | Attending: Emergency Medicine | Admitting: Emergency Medicine

## 2021-04-17 ENCOUNTER — Other Ambulatory Visit: Payer: Self-pay

## 2021-04-17 DIAGNOSIS — I700 Atherosclerosis of aorta: Secondary | ICD-10-CM | POA: Insufficient documentation

## 2021-04-17 DIAGNOSIS — Z96642 Presence of left artificial hip joint: Secondary | ICD-10-CM | POA: Insufficient documentation

## 2021-04-17 DIAGNOSIS — R911 Solitary pulmonary nodule: Secondary | ICD-10-CM | POA: Diagnosis not present

## 2021-04-17 DIAGNOSIS — J439 Emphysema, unspecified: Secondary | ICD-10-CM | POA: Diagnosis not present

## 2021-04-17 DIAGNOSIS — I878 Other specified disorders of veins: Secondary | ICD-10-CM | POA: Diagnosis not present

## 2021-04-17 DIAGNOSIS — M19012 Primary osteoarthritis, left shoulder: Secondary | ICD-10-CM | POA: Diagnosis not present

## 2021-04-17 LAB — GLUCOSE, CAPILLARY: Glucose-Capillary: 97 mg/dL (ref 70–99)

## 2021-04-17 MED ORDER — FLUDEOXYGLUCOSE F - 18 (FDG) INJECTION
7.1000 | Freq: Once | INTRAVENOUS | Status: AC
  Administered 2021-04-17: 6.9 via INTRAVENOUS

## 2021-04-23 ENCOUNTER — Other Ambulatory Visit: Payer: Self-pay

## 2021-04-23 ENCOUNTER — Encounter: Payer: Self-pay | Admitting: Emergency Medicine

## 2021-04-23 ENCOUNTER — Ambulatory Visit: Payer: Medicare HMO | Admitting: Emergency Medicine

## 2021-04-23 ENCOUNTER — Telehealth: Payer: Self-pay | Admitting: Emergency Medicine

## 2021-04-23 ENCOUNTER — Ambulatory Visit (INDEPENDENT_AMBULATORY_CARE_PROVIDER_SITE_OTHER): Payer: Medicare HMO | Admitting: Emergency Medicine

## 2021-04-23 VITALS — BP 136/90 | HR 64 | Temp 97.7°F | Ht 63.0 in | Wt 138.0 lb

## 2021-04-23 DIAGNOSIS — J4520 Mild intermittent asthma, uncomplicated: Secondary | ICD-10-CM

## 2021-04-23 DIAGNOSIS — R911 Solitary pulmonary nodule: Secondary | ICD-10-CM

## 2021-04-23 MED ORDER — STIOLTO RESPIMAT 2.5-2.5 MCG/ACT IN AERS: 2.0000 | INHALATION_SPRAY | Freq: Every day | RESPIRATORY_TRACT | 0 refills | Status: DC

## 2021-04-23 NOTE — Patient Instructions (Addendum)
We will start Stiolto 2 puffs once daily.  Keep track of whether this medication helps you.  If so then we will continue it going forward. Keep albuterol available to use 2 puffs up to every 4 hours if needed for shortness of breath, chest tightness, wheezing.  We will work on arranging for a bronchoscopy and biopsies to further evaluate your right upper lobe pulmonary nodule.  This will be done as an outpatient at [facility] under general anesthesia.  You will need a designated driver and someone to watch you afterwards at home.  You will also need to stop your aspirin at least 2 days prior to the test. Follow with Dr Lamonte Sakai in 1 month

## 2021-04-23 NOTE — Patient Instructions (Signed)
Full PFT performed today. °

## 2021-04-23 NOTE — Telephone Encounter (Signed)
I have scheduled pt for 9/12 at 7:30.  She is to go for covid test on 9/9.  I gave appt info to pt & made her aware to stop aspirin 2 days prior.  Called [facility] & spoke to [place].  She is going to have disk made of CT done on 7/29 & send it to [facility].

## 2021-04-23 NOTE — H&P (View-Only) (Signed)
Subjective:    Patient ID: Heather Ellis, female    DOB: 1945-10-17, 75 y.o.   MRN: [id number]  HPI 75 year old former smoker (10 pack years) with a history of obesity, prediabetes, polymyalgia rheumatica, hypertension, GERD.  She carries a history of COPD/asthma that was made in the early 80's. She is on pred 10mg  for her joint pain. She has albuterol, uses rarely when she has SOB. She has exertional SOB, is able to shop at slower pace. Can do some housework. Has trouble with stairs. She has minimal cough, no sputum.   She is referred today for evaluation of an abnormal CT scan of the chest.  She was seen in urgent care 03/28/2021 for shoulder pain.  Chest x-ray showed a suspected pulmonary nodule prompting CT scan of the chest.   Chest x-ray 03/28/2021 reviewed by me shows a 1.6 cm right midlung pulmonary nodule  CT scan of the chest 03/28/2021 reviewed by me, shows no evidence for mediastinal or hilar lymphadenopathy, and a 1.8 cm right upper lobe pulmonary nodule with spiculated margins.  There is a 5 mm right lower lobe nodule, 9 mm groundglass left upper lobe nodule.  Several scattered smaller nodules as well.   ROV 04/23/21 --follow-up visit 75 year old woman carries a history of COPD/asthma.  Past medical history also significant for polymyalgia rheumatica (prednisone 10 mg), hypertension, GERD, obesity. I saw her earlier this month for an abnormal CT scan of the chest that showed a 1.8 cm right upper lobe pulmonary nodule 5 mm right lower lobe nodule, 9 mm groundglass left upper lobe nodule.  Based on this we performed a PET scan as below.  She also had pulmonary function testing. She has SABA, but rarely needs it. She believes that her breathing is pretty good unless she is significantly exerting - stairs, etc.   PFT today reviewed by me show evidence for severe obstruction without a bronchodilator response, coexisting restriction based on lung volumes, decreased diffusion capacity that  corrects to the normal range when adjusted for alveolar volume.  Her flow-volume loop is consistent with obstruction.  PET scan performed 04/17/2021 reviewed by me, shows significant hypermetabolism in the right upper lobe pulmonary nodule without any evidence of mediastinal or hilar lymphadenopathy.  Some asymmetric uptake in the posterior right nasopharynx and the tonsillar region, likely physiologic   Review of Systems As per HPI     Objective:   Physical Exam Vitals:   04/23/21 1026  BP: 136/90  Pulse: 64  Temp: 97.7 F (36.5 C)  TempSrc: Oral  SpO2: 98%  Weight: 138 lb (62.6 kg)  Height: 5\' 3"  (1.6 m)    Gen: Pleasant elderly woman, overweight,  in no distress,  normal affect  ENT: No lesions,  mouth clear,  oropharynx clear, no postnasal drip  Neck: No JVD, no stridor  Lungs: No use of accessory muscles, somewhat small breaths but no crackles or wheezing on normal respiration, no wheeze on forced expiration  Cardiovascular: RRR, heart sounds normal, no murmur or gallops, no peripheral edema  Musculoskeletal: No deformities, no cyanosis or clubbing  Neuro: alert, awake, non focal  Skin: Warm, no lesions or rash      Assessment & Plan:  Pulmonary nodule 1 cm or greater in diameter We will work on arranging for a bronchoscopy and biopsies to further evaluate your right upper lobe pulmonary nodule.  This will be done as an outpatient at [facility] under general anesthesia.  You will need a designated driver  and someone to watch you afterwards at home.  You will also need to stop your aspirin at least 2 days prior to the test. Follow with Dr Lamonte Sakai in 1 month  Asthma We will start Stiolto 2 puffs once daily.  Keep track of whether this medication helps you.  If so then we will continue it going forward. Keep albuterol available to use 2 puffs up to every 4 hours if needed for shortness of breath, chest tightness, wheezing.    Baltazar Apo, MD, PhD 05/02/2021, 12:55  PM [facility] [phone] or if no answer before 7:00PM call [phone] For any issues after 7:00PM please call eLink [phone]

## 2021-04-23 NOTE — Progress Notes (Signed)
Full PFT performed today. °

## 2021-04-23 NOTE — Progress Notes (Signed)
Subjective:    Patient ID: Heather Ellis, female    DOB: Jul 22, 1946, 75 y.o.   MRN: [id number]  HPI 75 year old former smoker (10 pack years) with a history of obesity, prediabetes, polymyalgia rheumatica, hypertension, GERD.  She carries a history of COPD/asthma that was made in the early 80's. She is on pred 10mg  for her joint pain. She has albuterol, uses rarely when she has SOB. She has exertional SOB, is able to shop at slower pace. Can do some housework. Has trouble with stairs. She has minimal cough, no sputum.   She is referred today for evaluation of an abnormal CT scan of the chest.  She was seen in urgent care 03/28/2021 for shoulder pain.  Chest x-ray showed a suspected pulmonary nodule prompting CT scan of the chest.   Chest x-ray 03/28/2021 reviewed by me shows a 1.6 cm right midlung pulmonary nodule  CT scan of the chest 03/28/2021 reviewed by me, shows no evidence for mediastinal or hilar lymphadenopathy, and a 1.8 cm right upper lobe pulmonary nodule with spiculated margins.  There is a 5 mm right lower lobe nodule, 9 mm groundglass left upper lobe nodule.  Several scattered smaller nodules as well.   ROV 04/23/21 --follow-up visit 75 year old woman carries a history of COPD/asthma.  Past medical history also significant for polymyalgia rheumatica (prednisone 10 mg), hypertension, GERD, obesity. I saw her earlier this month for an abnormal CT scan of the chest that showed a 1.8 cm right upper lobe pulmonary nodule 5 mm right lower lobe nodule, 9 mm groundglass left upper lobe nodule.  Based on this we performed a PET scan as below.  She also had pulmonary function testing. She has SABA, but rarely needs it. She believes that her breathing is pretty good unless she is significantly exerting - stairs, etc.   PFT today reviewed by me show evidence for severe obstruction without a bronchodilator response, coexisting restriction based on lung volumes, decreased diffusion capacity that  corrects to the normal range when adjusted for alveolar volume.  Her flow-volume loop is consistent with obstruction.  PET scan performed 04/17/2021 reviewed by me, shows significant hypermetabolism in the right upper lobe pulmonary nodule without any evidence of mediastinal or hilar lymphadenopathy.  Some asymmetric uptake in the posterior right nasopharynx and the tonsillar region, likely physiologic   Review of Systems As per HPI     Objective:   Physical Exam Vitals:   04/23/21 1026  BP: 136/90  Pulse: 64  Temp: 97.7 F (36.5 C)  TempSrc: Oral  SpO2: 98%  Weight: 138 lb (62.6 kg)  Height: 5\' 3"  (1.6 m)    Gen: Pleasant elderly woman, overweight,  in no distress,  normal affect  ENT: No lesions,  mouth clear,  oropharynx clear, no postnasal drip  Neck: No JVD, no stridor  Lungs: No use of accessory muscles, somewhat small breaths but no crackles or wheezing on normal respiration, no wheeze on forced expiration  Cardiovascular: RRR, heart sounds normal, no murmur or gallops, no peripheral edema  Musculoskeletal: No deformities, no cyanosis or clubbing  Neuro: alert, awake, non focal  Skin: Warm, no lesions or rash      Assessment & Plan:  Pulmonary nodule 1 cm or greater in diameter We will work on arranging for a bronchoscopy and biopsies to further evaluate your right upper lobe pulmonary nodule.  This will be done as an outpatient at [facility] under general anesthesia.  You will need a designated driver  and someone to watch you afterwards at home.  You will also need to stop your aspirin at least 2 days prior to the test. Follow with Dr Lamonte Sakai in 1 month  Asthma We will start Stiolto 2 puffs once daily.  Keep track of whether this medication helps you.  If so then we will continue it going forward. Keep albuterol available to use 2 puffs up to every 4 hours if needed for shortness of breath, chest tightness, wheezing.    Baltazar Apo, MD, PhD 05/02/2021, 12:55  PM [facility] [phone] or if no answer before 7:00PM call [phone] For any issues after 7:00PM please call eLink [phone]

## 2021-05-01 DIAGNOSIS — K4020 Bilateral inguinal hernia, without obstruction or gangrene, not specified as recurrent: Secondary | ICD-10-CM | POA: Diagnosis not present

## 2021-05-02 LAB — PULMONARY FUNCTION TEST
DL/VA % pred: 106 %
DL/VA: 4.41 ml/min/mmHg/L
DLCO cor % pred: 74 %
DLCO cor: 13.78 ml/min/mmHg
DLCO unc % pred: 71 %
DLCO unc: 13.15 ml/min/mmHg
FEF 25-75 Post: 0.27 L/sec
FEF 25-75 Pre: 0.43 L/sec
FEF2575-%Change-Post: -36 %
FEF2575-%Pred-Post: 19 %
FEF2575-%Pred-Pre: 31 %
FEV1-%Change-Post: -15 %
FEV1-%Pred-Post: 36 %
FEV1-%Pred-Pre: 43 %
FEV1-Post: 0.59 L
FEV1-Pre: 0.69 L
FEV1FVC-%Change-Post: -7 %
FEV1FVC-%Pred-Pre: 89 %
FEV6-%Change-Post: -8 %
FEV6-%Pred-Post: 47 %
FEV6-%Pred-Pre: 51 %
FEV6-Post: 0.93 L
FEV6-Pre: 1.02 L
FEV6FVC-%Pred-Post: 104 %
FEV6FVC-%Pred-Pre: 104 %
FVC-%Change-Post: -8 %
FVC-%Pred-Post: 45 %
FVC-%Pred-Pre: 49 %
FVC-Post: 0.93 L
FVC-Pre: 1.02 L
Post FEV1/FVC ratio: 63 %
Post FEV6/FVC ratio: 100 %
Pre FEV1/FVC ratio: 68 %
Pre FEV6/FVC Ratio: 100 %

## 2021-05-02 NOTE — Assessment & Plan Note (Signed)
We will start Stiolto 2 puffs once daily.  Keep track of whether this medication helps you.  If so then we will continue it going forward. Keep albuterol available to use 2 puffs up to every 4 hours if needed for shortness of breath, chest tightness, wheezing.

## 2021-05-02 NOTE — Assessment & Plan Note (Signed)
We will work on arranging for a bronchoscopy and biopsies to further evaluate your right upper lobe pulmonary nodule.  This will be done as an outpatient at [facility] under general anesthesia.  You will need a designated driver and someone to watch you afterwards at home.  You will also need to stop your aspirin at least 2 days prior to the test. Follow with Dr Lamonte Sakai in 1 month

## 2021-05-08 ENCOUNTER — Encounter (HOSPITAL_COMMUNITY): Payer: Self-pay | Admitting: Emergency Medicine

## 2021-05-08 ENCOUNTER — Other Ambulatory Visit: Payer: Self-pay

## 2021-05-08 NOTE — Progress Notes (Signed)
Spoke with pt pre-op instructions given.

## 2021-05-09 ENCOUNTER — Other Ambulatory Visit: Payer: Self-pay | Admitting: Emergency Medicine

## 2021-05-10 LAB — SARS CORONAVIRUS 2 (TAT 6-24 HRS): SARS Coronavirus 2: NEGATIVE

## 2021-05-12 ENCOUNTER — Encounter (HOSPITAL_COMMUNITY): Payer: Self-pay | Admitting: Emergency Medicine

## 2021-05-12 ENCOUNTER — Ambulatory Visit (HOSPITAL_COMMUNITY): Payer: Medicare HMO

## 2021-05-12 ENCOUNTER — Encounter (HOSPITAL_COMMUNITY)
Admission: RE | Disposition: A | Discharge status: Home / Self Care | Payer: Self-pay | Source: Home / Self Care | Attending: Emergency Medicine

## 2021-05-12 ENCOUNTER — Ambulatory Visit (HOSPITAL_COMMUNITY): Payer: Medicare HMO | Admitting: Anesthesiology

## 2021-05-12 ENCOUNTER — Ambulatory Visit (HOSPITAL_COMMUNITY)
Admission: RE | Admit: 2021-05-12 | Discharge: 2021-05-12 | Disposition: A | Discharge status: Home / Self Care | Payer: Medicare HMO | Attending: Emergency Medicine | Admitting: Emergency Medicine

## 2021-05-12 ENCOUNTER — Other Ambulatory Visit: Payer: Self-pay

## 2021-05-12 DIAGNOSIS — I10 Essential (primary) hypertension: Secondary | ICD-10-CM | POA: Diagnosis not present

## 2021-05-12 DIAGNOSIS — N179 Acute kidney failure, unspecified: Secondary | ICD-10-CM | POA: Diagnosis not present

## 2021-05-12 DIAGNOSIS — J449 Chronic obstructive pulmonary disease, unspecified: Secondary | ICD-10-CM | POA: Diagnosis not present

## 2021-05-12 DIAGNOSIS — R7303 Prediabetes: Secondary | ICD-10-CM | POA: Diagnosis not present

## 2021-05-12 DIAGNOSIS — R911 Solitary pulmonary nodule: Secondary | ICD-10-CM | POA: Diagnosis present

## 2021-05-12 DIAGNOSIS — Z87891 Personal history of nicotine dependence: Secondary | ICD-10-CM | POA: Diagnosis not present

## 2021-05-12 DIAGNOSIS — E669 Obesity, unspecified: Secondary | ICD-10-CM | POA: Diagnosis not present

## 2021-05-12 DIAGNOSIS — M353 Polymyalgia rheumatica: Secondary | ICD-10-CM | POA: Diagnosis not present

## 2021-05-12 DIAGNOSIS — Z9889 Other specified postprocedural states: Secondary | ICD-10-CM

## 2021-05-12 DIAGNOSIS — E559 Vitamin D deficiency, unspecified: Secondary | ICD-10-CM | POA: Diagnosis not present

## 2021-05-12 DIAGNOSIS — Z6825 Body mass index (BMI) 25.0-25.9, adult: Secondary | ICD-10-CM | POA: Insufficient documentation

## 2021-05-12 DIAGNOSIS — K219 Gastro-esophageal reflux disease without esophagitis: Secondary | ICD-10-CM | POA: Insufficient documentation

## 2021-05-12 DIAGNOSIS — R918 Other nonspecific abnormal finding of lung field: Secondary | ICD-10-CM | POA: Diagnosis not present

## 2021-05-12 DIAGNOSIS — C3411 Malignant neoplasm of upper lobe, right bronchus or lung: Secondary | ICD-10-CM | POA: Insufficient documentation

## 2021-05-12 DIAGNOSIS — Z419 Encounter for procedure for purposes other than remedying health state, unspecified: Secondary | ICD-10-CM

## 2021-05-12 HISTORY — PX: VIDEO BRONCHOSCOPY WITH ENDOBRONCHIAL NAVIGATION: SHX6175

## 2021-05-12 HISTORY — PX: BRONCHIAL NEEDLE ASPIRATION BIOPSY: SHX5106

## 2021-05-12 HISTORY — PX: VIDEO BRONCHOSCOPY WITH RADIAL ENDOBRONCHIAL ULTRASOUND: SHX6849

## 2021-05-12 HISTORY — PX: BRONCHIAL BRUSHINGS: SHX5108

## 2021-05-12 HISTORY — PX: BRONCHIAL BIOPSY: SHX5109

## 2021-05-12 HISTORY — PX: FIDUCIAL MARKER PLACEMENT: SHX6858

## 2021-05-12 LAB — BASIC METABOLIC PANEL
Anion gap: 8 (ref 5–15)
BUN: 10 mg/dL (ref 8–23)
CO2: 30 mmol/L (ref 22–32)
Calcium: 9.8 mg/dL (ref 8.9–10.3)
Chloride: 100 mmol/L (ref 98–111)
Creatinine, Ser: 0.66 mg/dL (ref 0.44–1.00)
GFR, Estimated: >60 mL/min (ref >60)
Glucose, Bld: 102 mg/dL — ABNORMAL HIGH (ref 70–99)
Potassium: 3.7 mmol/L (ref 3.5–5.1)
Sodium: 138 mmol/L (ref 135–145)

## 2021-05-12 LAB — GLUCOSE, CAPILLARY
Glucose-Capillary: 94 mg/dL (ref 70–99)
Glucose-Capillary: 95 mg/dL (ref 70–99)

## 2021-05-12 LAB — CBC
HCT: 35.3 % — ABNORMAL LOW (ref 36.0–46.0)
Hemoglobin: 11.2 g/dL — ABNORMAL LOW (ref 12.0–15.0)
MCH: 29.6 pg (ref 26.0–34.0)
MCHC: 31.7 g/dL (ref 30.0–36.0)
MCV: 93.4 fL (ref 80.0–100.0)
Platelets: 230 10*3/uL (ref 150–400)
RBC: 3.78 MIL/uL — ABNORMAL LOW (ref 3.87–5.11)
RDW: 14.9 % (ref 11.5–15.5)
WBC: 5 10*3/uL (ref 4.0–10.5)
nRBC: 0 % (ref 0.0–0.2)

## 2021-05-12 LAB — SURGICAL PCR SCREEN
MRSA, PCR: POSITIVE — AB
Staphylococcus aureus: POSITIVE — AB

## 2021-05-12 SURGERY — VIDEO BRONCHOSCOPY WITH ENDOBRONCHIAL NAVIGATION
Anesthesia: General | Laterality: Right

## 2021-05-12 MED ORDER — PROPOFOL 10 MG/ML IV BOLUS
INTRAVENOUS | Status: DC | PRN
  Administered 2021-05-12: 10 mg via INTRAVENOUS
  Administered 2021-05-12: 160 mg via INTRAVENOUS

## 2021-05-12 MED ORDER — FENTANYL CITRATE (PF) 250 MCG/5ML IJ SOLN
INTRAMUSCULAR | Status: AC
  Filled 2021-05-12: qty 5

## 2021-05-12 MED ORDER — LABETALOL HCL 5 MG/ML IV SOLN
INTRAVENOUS | Status: DC | PRN
  Administered 2021-05-12 (×3): 5 mg via INTRAVENOUS

## 2021-05-12 MED ORDER — PHENYLEPHRINE 40 MCG/ML (10ML) SYRINGE FOR IV PUSH (FOR BLOOD PRESSURE SUPPORT)
PREFILLED_SYRINGE | INTRAVENOUS | Status: DC | PRN
  Administered 2021-05-12: 80 ug via INTRAVENOUS

## 2021-05-12 MED ORDER — LACTATED RINGERS IV SOLN: INTRAVENOUS | Status: DC

## 2021-05-12 MED ORDER — SUGAMMADEX SODIUM 200 MG/2ML IV SOLN
INTRAVENOUS | Status: DC | PRN
  Administered 2021-05-12: 200 mg via INTRAVENOUS

## 2021-05-12 MED ORDER — CHLORHEXIDINE GLUCONATE 0.12 % MT SOLN
OROMUCOSAL | Status: AC
  Administered 2021-05-12: 15 mL via OROMUCOSAL
  Filled 2021-05-12: qty 15

## 2021-05-12 MED ORDER — LIDOCAINE 2% (20 MG/ML) 5 ML SYRINGE
INTRAMUSCULAR | Status: DC | PRN
Start: 2021-05-12 — End: 2021-05-12
  Administered 2021-05-12: 50 mg via INTRAVENOUS

## 2021-05-12 MED ORDER — FENTANYL CITRATE (PF) 250 MCG/5ML IJ SOLN
INTRAMUSCULAR | Status: DC | PRN
  Administered 2021-05-12: 100 ug via INTRAVENOUS

## 2021-05-12 MED ORDER — CHLORHEXIDINE GLUCONATE 0.12 % MT SOLN: 15.0000 mL | Freq: Once | OROMUCOSAL | Status: AC

## 2021-05-12 MED ORDER — ONDANSETRON HCL 4 MG/2ML IJ SOLN
INTRAMUSCULAR | Status: DC | PRN
  Administered 2021-05-12: 4 mg via INTRAVENOUS

## 2021-05-12 MED ORDER — DEXAMETHASONE SODIUM PHOSPHATE 10 MG/ML IJ SOLN
INTRAMUSCULAR | Status: DC | PRN
  Administered 2021-05-12: 10 mg via INTRAVENOUS

## 2021-05-12 MED ORDER — ROCURONIUM BROMIDE 10 MG/ML (PF) SYRINGE
PREFILLED_SYRINGE | INTRAVENOUS | Status: DC | PRN
Start: 2021-05-12 — End: 2021-05-12
  Administered 2021-05-12: 40 mg via INTRAVENOUS

## 2021-05-12 SURGICAL SUPPLY — 1 items: fiducial marker ×1 IMPLANT

## 2021-05-12 NOTE — Anesthesia Preprocedure Evaluation (Signed)
Anesthesia Evaluation  Patient identified by MRN, date of birth, ID band Patient awake    Reviewed: Allergy & Precautions, NPO status , Patient's Chart, lab work & pertinent test results  Airway Mallampati: II  TM Distance: >3 FB Neck ROM: Full    Dental  (+) Dental Advisory Given   Pulmonary asthma , COPD, former smoker,    breath sounds clear to auscultation       Cardiovascular hypertension, Pt. on medications and Pt. on home beta blockers  Rhythm:Regular Rate:Normal     Neuro/Psych negative neurological ROS     GI/Hepatic Neg liver ROS, GERD  ,  Endo/Other  negative endocrine ROS  Renal/GU Renal disease     Musculoskeletal  (+) Arthritis ,   Abdominal   Peds  Hematology negative hematology ROS (+)   Anesthesia Other Findings   Reproductive/Obstetrics                             Anesthesia Physical Anesthesia Plan  ASA: 3  Anesthesia Plan: General   Post-op Pain Management:    Induction: Intravenous  PONV Risk Score and Plan: 3 and Dexamethasone, Ondansetron, Treatment may vary due to age or medical condition and Midazolam  Airway Management Planned: Oral ETT  Additional Equipment: None  Intra-op Plan:   Post-operative Plan: Extubation in OR  Informed Consent: I have reviewed the patients History and Physical, chart, labs and discussed the procedure including the risks, benefits and alternatives for the proposed anesthesia with the patient or authorized representative who has indicated his/her understanding and acceptance.     Dental advisory given  Plan Discussed with: CRNA  Anesthesia Plan Comments:         Anesthesia Quick Evaluation

## 2021-05-12 NOTE — Interval H&P Note (Signed)
History and Physical Interval Note:  05/12/2021 7:29 AM  Heather Ellis  has presented today for surgery, with the diagnosis of PULMONARY NODULE RIGHT.  The various methods of treatment have been discussed with the patient and family. After consideration of risks, benefits and other options for treatment, the patient has consented to  Procedure(s): ROBOTIC [place] (Right) as a surgical intervention.  The patient's history has been reviewed, patient examined, no change in status, stable for surgery.  I have reviewed the patient's chart and labs.  Questions were answered to the patient's satisfaction.     Collene Gobble

## 2021-05-12 NOTE — Progress Notes (Signed)
CRITICAL RESULT PROVIDER NOTIFICATION  Test performed and critical result:  positive MRSA    Date and time result received:  9/12 1040   Provider name/title: Lamonte Sakai   Date and time provider notified: 9/12 1040   Date and time provider responded: 9/12 1040   Provider response:No new orders

## 2021-05-12 NOTE — Discharge Instructions (Signed)
Flexible Bronchoscopy, Care After This sheet gives you information about how to care for yourself after your test. Your doctor may also give you more specific instructions. If you have problems or questions, contact your doctor. Follow these instructions at home: Eating and drinking Do not eat or drink anything (not even water) for 2 hours after your test, or until your numbing medicine (local anesthetic) wears off. When your numbness is gone and your cough and gag reflexes have come back, you may: Eat only soft foods. Slowly drink liquids. The day after the test, go back to your normal diet. Driving Do not drive for 24 hours if you were given a medicine to help you relax (sedative). Do not drive or use heavy machinery while taking prescription pain medicine. General instructions  Take over-the-counter and prescription medicines only as told by your doctor. Return to your normal activities as told. Ask what activities are safe for you. Do not use any products that have nicotine or tobacco in them. This includes cigarettes and e-cigarettes. If you need help quitting, ask your doctor. Keep all follow-up visits as told by your doctor. This is important. It is very important if you had a tissue sample (biopsy) taken. Get help right away if: You have shortness of breath that gets worse. You get light-headed. You feel like you are going to pass out (faint). You have chest pain. You cough up: More than a little blood. More blood than before. Summary Do not eat or drink anything (not even water) for 2 hours after your test, or until your numbing medicine wears off. Do not use cigarettes. Do not use e-cigarettes. Get help right away if you have chest pain.  Please call our office for any questions or concerns.  [phone].  This information is not intended to replace advice given to you by your health care provider. Make sure you discuss any questions you have with your health care  provider. Document Released: 06/14/2009 Document Revised: 07/30/2017 Document Reviewed: 09/04/2016 Elsevier Patient Education  2020 [employer].

## 2021-05-12 NOTE — Anesthesia Procedure Notes (Signed)
Procedure Name: Intubation Date/Time: 05/12/2021 9:07 AM Performed by: Betha Loa, CRNA Pre-anesthesia Checklist: Patient identified, Emergency Drugs available, Suction available and Patient being monitored Patient Re-evaluated:Patient Re-evaluated prior to induction Oxygen Delivery Method: Circle System Utilized Preoxygenation: Pre-oxygenation with 100% oxygen Induction Type: IV induction Ventilation: Mask ventilation without difficulty Laryngoscope Size: Mac Grade View: Grade I Tube type: Oral Tube size: 8.5 mm Number of attempts: 1 Airway Equipment and Method: Stylet and Oral airway Placement Confirmation: ETT inserted through vocal cords under direct vision, positive ETCO2 and breath sounds checked- equal and bilateral Secured at: 20 cm Tube secured with: Tape Dental Injury: Teeth and Oropharynx as per pre-operative assessment

## 2021-05-12 NOTE — Transfer of Care (Signed)
Immediate Anesthesia Transfer of Care Note  Patient: Heather Ellis  Procedure(s) Performed: ROBOTIC ASSISTED BRONCHOSCOPY WITH ENDOBRONCHIAL NAVIGATION (Right) BRONCHIAL BRUSHINGS VIDEO BRONCHOSCOPY WITH RADIAL ENDOBRONCHIAL ULTRASOUND BRONCHIAL BIOPSIES BRONCHIAL NEEDLE ASPIRATION BIOPSIES FIDUCIAL MARKER PLACEMENT  Patient Location: PACU  Anesthesia Type:General  Level of Consciousness: patient cooperative and responds to stimulation  Airway & Oxygen Therapy: Patient Spontanous Breathing  Post-op Assessment: Report given to RN and Post -op Vital signs reviewed and stable  Post vital signs: Reviewed and stable  Last Vitals:  Vitals Value Taken Time  BP 136/76 05/12/21 1000  Temp    Pulse 78 05/12/21 1005  Resp 21 05/12/21 1005  SpO2 91 % 05/12/21 1005  Vitals shown include unvalidated device data.  Last Pain:  Vitals:   05/12/21 0713  TempSrc:   PainSc: 0-No pain         Complications: No notable events documented.

## 2021-05-12 NOTE — Op Note (Signed)
Video Bronchoscopy with Robotic Assisted Bronchoscopic Navigation   Date of Operation: 05/12/2021   Pre-op Diagnosis: Bilateral pulmonary nodules  Post-op Diagnosis: Same  Surgeon: Baltazar Apo  Assistants: None  Anesthesia: General endotracheal anesthesia  Operation: Flexible video fiberoptic bronchoscopy with robotic assistance and biopsies.  Estimated Blood Loss: Minimal  Complications: None  Indications and History: Heather Ellis is a 75 y.o. female with history of tobacco use.  She was found to have bilateral pulmonary nodules with the predominant solid nodule in the right upper lobe, smaller peripheral groundglass nodule in the left upper lobe.  Recommendation was made to achieve a tissue diagnosis via robotic assisted navigational bronchoscopy with biopsies. The risks, benefits, complications, treatment options and expected outcomes were discussed with the patient.  The possibilities of pneumothorax, pneumonia, reaction to medication, pulmonary aspiration, perforation of a viscus, bleeding, failure to diagnose a condition and creating a complication requiring transfusion or operation were discussed with the patient who freely signed the consent.    Description of Procedure: The patient was seen in the Preoperative Area, was examined and was deemed appropriate to proceed.  The patient was taken to [facility] endoscopy room 3, identified as Heather Ellis and the procedure verified as Flexible Video Fiberoptic Bronchoscopy.  A Time Out was held and the above information confirmed.   Prior to the date of the procedure a high-resolution CT scan of the chest was performed. Utilizing [employer] PlanPoint software program a virtual tracheobronchial tree was generated to allow the creation of distinct navigation pathways to the patient's parenchymal abnormalities. After being taken to the operating room general anesthesia was initiated and the patient  was orally intubated. The video fiberoptic  bronchoscope was introduced via the endotracheal tube and a general inspection was performed which showed normal right and left lung anatomy, aspiration of the bilateral mainstems was completed to remove any remaining secretions. Robotic catheter inserted into patient's endotracheal tube.   Target #1 right upper lobe pulmonary nodule: The distinct navigation pathways prepared prior to this procedure were then utilized to navigate to patient's lesion identified on CT scan. The robotic catheter was secured into place and the vision probe was withdrawn.  Lesion location was approximated using fluoroscopy and radial endobronchial ultrasound for peripheral targeting. Under fluoroscopic guidance transbronchial brushings, transbronchial needle biopsies, and transbronchial forceps biopsies were performed to be sent for cytology and pathology.   Target #2 left upper lobe groundglass nodule: The airway tree was poor on the left and did not provide an adequate approach to the peripheral groundglass nodule.  For this reason navigation to this area was deferred and no biopsies in that region were performed.  At the end of the procedure a general airway inspection was performed and there was no evidence of active bleeding. The bronchoscope was removed.  The patient tolerated the procedure well. There was no significant blood loss and there were no obvious complications. A post-procedural chest x-ray is pending.  Samples Target #1: 1. Transbronchial brushings from right upper lobe pulmonary nodule 2. Transbronchial Wang needle biopsies from right upper lobe pulmonary nodule 3. Transbronchial forceps biopsies from right upper lobe pulmonary nodule   Plans:  The patient will be discharged from the PACU to home when recovered from anesthesia and after chest x-ray is reviewed. We will review the cytology, pathology and microbiology results with the patient when they become available. Outpatient followup will be with  Dr. Lamonte Sakai.    Baltazar Apo, MD, PhD 05/12/2021, 9:54 AM [facility] [phone] or if no answer before 7:00PM call [phone] For any issues after 7:00PM please call eLink [phone]

## 2021-05-12 NOTE — Anesthesia Postprocedure Evaluation (Signed)
Anesthesia Post Note  Patient: Heather Ellis  Procedure(s) Performed: ROBOTIC ASSISTED BRONCHOSCOPY WITH ENDOBRONCHIAL NAVIGATION (Right) BRONCHIAL BRUSHINGS VIDEO BRONCHOSCOPY WITH RADIAL ENDOBRONCHIAL ULTRASOUND BRONCHIAL BIOPSIES BRONCHIAL NEEDLE ASPIRATION BIOPSIES FIDUCIAL MARKER PLACEMENT     Patient location during evaluation: PACU Anesthesia Type: General Level of consciousness: awake and alert Pain management: pain level controlled Vital Signs Assessment: post-procedure vital signs reviewed and stable Respiratory status: spontaneous breathing, nonlabored ventilation, respiratory function stable and patient connected to nasal cannula oxygen Cardiovascular status: blood pressure returned to baseline and stable Postop Assessment: no apparent nausea or vomiting Anesthetic complications: no   No notable events documented.  Last Vitals:  Vitals:   05/12/21 1015 05/12/21 1030  BP: 135/75 136/80  Pulse: 78 72  Resp: 19 17  Temp:  36.8 C  SpO2: 93% 96%    Last Pain:  Vitals:   05/12/21 1030  TempSrc:   PainSc: 0-No pain                 Tiajuana Amass

## 2021-05-12 NOTE — Progress Notes (Signed)
Dr. Ola Spurr made aware of patient's elevated BP upon arrival to short stay.  No new orders received.

## 2021-05-13 ENCOUNTER — Encounter (HOSPITAL_COMMUNITY): Payer: Self-pay | Admitting: Emergency Medicine

## 2021-05-15 ENCOUNTER — Telehealth: Payer: Self-pay | Admitting: Emergency Medicine

## 2021-05-15 DIAGNOSIS — C3490 Malignant neoplasm of unspecified part of unspecified bronchus or lung: Secondary | ICD-10-CM

## 2021-05-15 NOTE — Telephone Encounter (Signed)
Attempted to call patient to discuss pathology results.  No answer we will try her again.  The biopsies did show non-small cell lung cancer.

## 2021-05-16 ENCOUNTER — Telehealth: Payer: Self-pay | Admitting: *Deleted

## 2021-05-16 DIAGNOSIS — I10 Essential (primary) hypertension: Secondary | ICD-10-CM | POA: Diagnosis not present

## 2021-05-16 DIAGNOSIS — K219 Gastro-esophageal reflux disease without esophagitis: Secondary | ICD-10-CM | POA: Diagnosis not present

## 2021-05-16 NOTE — Telephone Encounter (Signed)
I was able to reach the patient this am. Explained pathology results to her.  I will make a referral to [facility] at the [facility]. MRI brain ordered

## 2021-05-16 NOTE — Telephone Encounter (Signed)
I received referral on Ms. Sennett today.  I called to set her up with an appt. I was unable to reach nor leave vm message for her to call me.

## 2021-05-19 ENCOUNTER — Telehealth: Payer: Self-pay | Admitting: *Deleted

## 2021-05-19 NOTE — Telephone Encounter (Signed)
I received referral on Heather Ellis.  I called but was unable to reach, it was busy.

## 2021-05-21 ENCOUNTER — Other Ambulatory Visit: Payer: Self-pay

## 2021-05-21 ENCOUNTER — Ambulatory Visit (HOSPITAL_COMMUNITY)
Admission: RE | Admit: 2021-05-21 | Discharge: 2021-05-21 | Disposition: A | Discharge status: Home / Self Care | Payer: Medicare HMO | Source: Ambulatory Visit | Attending: Emergency Medicine | Admitting: Emergency Medicine

## 2021-05-21 DIAGNOSIS — I739 Peripheral vascular disease, unspecified: Secondary | ICD-10-CM | POA: Diagnosis not present

## 2021-05-21 DIAGNOSIS — C3490 Malignant neoplasm of unspecified part of unspecified bronchus or lung: Secondary | ICD-10-CM | POA: Diagnosis not present

## 2021-05-21 DIAGNOSIS — G9389 Other specified disorders of brain: Secondary | ICD-10-CM | POA: Diagnosis not present

## 2021-05-21 DIAGNOSIS — I613 Nontraumatic intracerebral hemorrhage in brain stem: Secondary | ICD-10-CM | POA: Diagnosis not present

## 2021-05-21 MED ORDER — GADOBUTROL 1 MMOL/ML IV SOLN
7.0000 mL | Freq: Once | INTRAVENOUS | Status: AC | PRN
  Administered 2021-05-21: 7 mL via INTRAVENOUS

## 2021-05-22 ENCOUNTER — Telehealth: Payer: Self-pay | Admitting: *Deleted

## 2021-05-22 ENCOUNTER — Telehealth: Payer: Self-pay | Admitting: Emergency Medicine

## 2021-05-22 DIAGNOSIS — R911 Solitary pulmonary nodule: Secondary | ICD-10-CM

## 2021-05-22 NOTE — Telephone Encounter (Signed)
FMLA form was hand carried to [facility] [employer] [facility] office for patient's daughter, Laureen Ochs, to get FMLA from her employer [employer].  Emailed form to [employer] for processing. There will be a preparation fee for this service.

## 2021-05-22 NOTE — Telephone Encounter (Signed)
I received referral on Heather Ellis.  I called and update her on appt next week with Dr. Julien Nordmann. She verbalized understanding of appt.

## 2021-05-29 ENCOUNTER — Other Ambulatory Visit: Payer: Self-pay

## 2021-05-29 ENCOUNTER — Encounter: Payer: Self-pay | Admitting: Internal Medicine

## 2021-05-29 ENCOUNTER — Inpatient Hospital Stay: Payer: Medicare HMO | Attending: Internal Medicine

## 2021-05-29 ENCOUNTER — Encounter: Payer: Self-pay | Admitting: *Deleted

## 2021-05-29 ENCOUNTER — Other Ambulatory Visit: Payer: Self-pay | Admitting: Medical Oncology

## 2021-05-29 ENCOUNTER — Inpatient Hospital Stay (HOSPITAL_BASED_OUTPATIENT_CLINIC_OR_DEPARTMENT_OTHER): Payer: Medicare HMO | Admitting: Internal Medicine

## 2021-05-29 VITALS — BP 222/108 | HR 84 | Temp 98.0°F | Resp 19 | Ht 63.0 in | Wt 145.9 lb

## 2021-05-29 DIAGNOSIS — Z801 Family history of malignant neoplasm of trachea, bronchus and lung: Secondary | ICD-10-CM | POA: Insufficient documentation

## 2021-05-29 DIAGNOSIS — J449 Chronic obstructive pulmonary disease, unspecified: Secondary | ICD-10-CM | POA: Diagnosis not present

## 2021-05-29 DIAGNOSIS — Z87891 Personal history of nicotine dependence: Secondary | ICD-10-CM | POA: Diagnosis not present

## 2021-05-29 DIAGNOSIS — Z79899 Other long term (current) drug therapy: Secondary | ICD-10-CM

## 2021-05-29 DIAGNOSIS — R911 Solitary pulmonary nodule: Secondary | ICD-10-CM

## 2021-05-29 DIAGNOSIS — C3490 Malignant neoplasm of unspecified part of unspecified bronchus or lung: Secondary | ICD-10-CM

## 2021-05-29 DIAGNOSIS — I10 Essential (primary) hypertension: Secondary | ICD-10-CM

## 2021-05-29 DIAGNOSIS — Z8249 Family history of ischemic heart disease and other diseases of the circulatory system: Secondary | ICD-10-CM | POA: Diagnosis not present

## 2021-05-29 DIAGNOSIS — Z8041 Family history of malignant neoplasm of ovary: Secondary | ICD-10-CM | POA: Insufficient documentation

## 2021-05-29 DIAGNOSIS — Z7982 Long term (current) use of aspirin: Secondary | ICD-10-CM

## 2021-05-29 DIAGNOSIS — Z7952 Long term (current) use of systemic steroids: Secondary | ICD-10-CM | POA: Insufficient documentation

## 2021-05-29 DIAGNOSIS — C3411 Malignant neoplasm of upper lobe, right bronchus or lung: Secondary | ICD-10-CM

## 2021-05-29 DIAGNOSIS — C3491 Malignant neoplasm of unspecified part of right bronchus or lung: Secondary | ICD-10-CM

## 2021-05-29 LAB — CBC WITH DIFFERENTIAL (CANCER CENTER ONLY)
Abs Immature Granulocytes: 0.01 10*3/uL (ref 0.00–0.07)
Basophils Absolute: 0 10*3/uL (ref 0.0–0.1)
Basophils Relative: 1 %
Eosinophils Absolute: 0.1 10*3/uL (ref 0.0–0.5)
Eosinophils Relative: 2 %
HCT: 34 % — ABNORMAL LOW (ref 36.0–46.0)
Hemoglobin: 10.8 g/dL — ABNORMAL LOW (ref 12.0–15.0)
Immature Granulocytes: 0 %
Lymphocytes Relative: 44 %
Lymphs Abs: 1.8 10*3/uL (ref 0.7–4.0)
MCH: 29.5 pg (ref 26.0–34.0)
MCHC: 31.8 g/dL (ref 30.0–36.0)
MCV: 92.9 fL (ref 80.0–100.0)
Monocytes Absolute: 0.4 10*3/uL (ref 0.1–1.0)
Monocytes Relative: 11 %
Neutro Abs: 1.7 10*3/uL (ref 1.7–7.7)
Neutrophils Relative %: 42 %
Platelet Count: 228 10*3/uL (ref 150–400)
RBC: 3.66 MIL/uL — ABNORMAL LOW (ref 3.87–5.11)
RDW: 15.4 % (ref 11.5–15.5)
WBC Count: 4 10*3/uL (ref 4.0–10.5)
nRBC: 0 % (ref 0.0–0.2)

## 2021-05-29 LAB — CMP (CANCER CENTER ONLY)
ALT: 6 U/L (ref 0–44)
AST: 14 U/L — ABNORMAL LOW (ref 15–41)
Albumin: 3.7 g/dL (ref 3.5–5.0)
Alkaline Phosphatase: 93 U/L (ref 38–126)
Anion gap: 10 (ref 5–15)
BUN: 12 mg/dL (ref 8–23)
CO2: 31 mmol/L (ref 22–32)
Calcium: 10.4 mg/dL — ABNORMAL HIGH (ref 8.9–10.3)
Chloride: 102 mmol/L (ref 98–111)
Creatinine: 0.7 mg/dL (ref 0.44–1.00)
GFR, Estimated: >60 mL/min (ref >60)
Glucose, Bld: 87 mg/dL (ref 70–99)
Potassium: 3.9 mmol/L (ref 3.5–5.1)
Sodium: 143 mmol/L (ref 135–145)
Total Bilirubin: 0.6 mg/dL (ref 0.3–1.2)
Total Protein: 7.3 g/dL (ref 6.5–8.1)

## 2021-05-29 NOTE — Progress Notes (Signed)
I spoke to patient and her son in law today during her first visit with Dr. Julien Nordmann.  Patient is newly diagnosed with NSCLC. Treatment plan is for her to be evaluated with thoracic surgery.  Referral completed. I help to explain diagnosis and treatment plan.

## 2021-05-29 NOTE — Progress Notes (Signed)
[place] [phone]   [phone]  CONSULT NOTE  REFERRING PHYSICIAN: Dr. Baltazar Apo  REASON FOR CONSULTATION:  75 years old African-American female recently diagnosed with lung cancer.  HPI Heather Ellis is a 75 y.o. female with past medical history significant for COPD, hypertension, prediabetes, osteoarthritis, polymyalgia rheumatica, asthma, anemia, GERD, syncopal episodes as well as vitamin D deficiency and long history of smoking.  The was complaining of shoulder pain and she presented to an urgent care center for evaluation.  She had chest x-ray 03/28/2021 at that time that showed a suspected 1.6 cm right upper lobe pulmonary nodule.  She had CT scan of the chest on 03/28/2021 and that showed a 1.8 cm spiculated nodule with spiculated margins within the periphery of the right upper lobe suspicious for primary bronchogenic carcinoma.  There was nonspecific groundglass attenuation nodule in the left upper lobe measuring 0.9 cm with additional small nonspecific pulmonary nodules scattered within both lungs measuring up to 0.5 cm and are nonspecific.  The patient was referred to Dr. Lamonte Sakai and a PET scan performed on 04/17/2021 showed markedly hypermetabolic right upper lobe pulmonary lesion consistent with primary bronchogenic neoplasm.  There was no evidence for hypermetabolic metastatic disease. On 05/12/2021 the patient underwent video bronchoscopy with robotic assisted bronchoscopic navigation and biopsy of the right upper lobe lung nodule The final pathology ([id number]) showed malignant cells consistent with non-small cell carcinoma. Dr. Lamonte Sakai kindly referred the patient to me today for evaluation and recommendation regarding her condition. When seen today the patient is feeling fine with no concerning complaints except for the left shoulder pain as well as cough and shortness of breath with exertion but no significant chest pain or hemoptysis.   She denied having any nausea, vomiting, diarrhea or constipation.  She has no headache or visual changes.  She has no weight loss or night sweats. Family history significant for mother with throat cancer.  Father had heart disease.  2 uncles with cancer including colon and lung.  Sister had bone cancer and daughter had ovarian cancer. The patient is separated and has 4 living children and 1 deceased daughter from the ovarian cancer.  She works at [employer].  She has a history of smoking 1 pack/day for around 55 years quit in 2016.  She also used to drink alcohol at regular basis and quit in 2016.  She has no history of drug abuse.  HPI  Past Medical History:  Diagnosis Date   Acid reflux    Anemia    Asthma    COPD (chronic obstructive pulmonary disease) (HCC)    H/O polymyalgia rheumatica    History of COVID-19 2020   Hypertension    OA (osteoarthritis)    Obesity    Osteoporosis    Pre-diabetes    Syncope    Syncope and collapse 08/2018   Vitamin D deficiency     Past Surgical History:  Procedure Laterality Date   ABDOMINAL HYSTERECTOMY     TOTAL ABDOMINAL HYSTERECTOMY    BRONCHIAL BIOPSY  05/12/2021   Procedure: BRONCHIAL BIOPSIES;  Surgeon: Collene Gobble, MD;  Location: [facility];  Service: Pulmonary;;   BRONCHIAL BRUSHINGS  05/12/2021   Procedure: BRONCHIAL BRUSHINGS;  Surgeon: Collene Gobble, MD;  Location: [facility];  Service: Pulmonary;;   BRONCHIAL NEEDLE ASPIRATION BIOPSY  05/12/2021   Procedure: BRONCHIAL NEEDLE ASPIRATION BIOPSIES;  Surgeon: Collene Gobble, MD;  Location: [facility];  Service: Pulmonary;;  COLONOSCOPY W/ BIOPSIES  2015   ESOPHAGOGASTRODUODENOSCOPY     FIDUCIAL MARKER PLACEMENT  05/12/2021   Procedure: FIDUCIAL MARKER PLACEMENT;  Surgeon: Collene Gobble, MD;  Location: [facility] ENDOSCOPY;  Service: Pulmonary;;   OOPHORECTOMY     BSO   TOTAL HIP ARTHROPLASTY Left 01/16/2020   Procedure: TOTAL HIP ARTHROPLASTY ANTERIOR APPROACH;   Surgeon: Renette Butters, MD;  Location: [facility];  Service: Orthopedics;  Laterality: Left;   VIDEO BRONCHOSCOPY WITH ENDOBRONCHIAL NAVIGATION Right 05/12/2021   Procedure: ROBOTIC ASSISTED BRONCHOSCOPY WITH ENDOBRONCHIAL NAVIGATION;  Surgeon: Collene Gobble, MD;  Location: [facility];  Service: Pulmonary;  Laterality: Right;   VIDEO BRONCHOSCOPY WITH RADIAL ENDOBRONCHIAL ULTRASOUND  05/12/2021   Procedure: VIDEO BRONCHOSCOPY WITH RADIAL ENDOBRONCHIAL ULTRASOUND;  Surgeon: Collene Gobble, MD;  Location: [facility] ENDOSCOPY;  Service: Pulmonary;;    Family History  Problem Relation Age of Onset   Cancer Mother        THROAT- SMOKER   Diabetes Mother    Seizures Mother    Syncope episode Mother    Diabetes Sister    Heart disease Maternal Aunt    Stroke Maternal Aunt    Cancer Maternal Uncle        LUNG -    Heart disease Maternal Uncle    Stroke Maternal Uncle    Ovarian cancer Maternal Grandmother    Heart attack Father    Heart disease Father    Syncope episode Brother     Social History Social History   Tobacco Use   Smoking status: Former    Types: Cigarettes    Quit date: 10/19/2014    Years since quitting: 6.6   Smokeless tobacco: Never  Vaping Use   Vaping Use: Never used  Substance Use Topics   Alcohol use: Not Currently    Alcohol/week: 0.0 standard drinks    Comment: occasional   Drug use: No    Allergies  Allergen Reactions   Fosamax [Alendronate Sodium] Nausea And Vomiting    Current Outpatient Medications  Medication Sig Dispense Refill   amLODipine (NORVASC) 5 MG tablet Take 5 mg by mouth daily.     aspirin EC 81 MG tablet Take 1 tablet (81 mg total) by mouth 2 (two) times daily. For DVT prophylaxis for 30 days after surgery. 60 tablet 0   bisoprolol-hydrochlorothiazide (ZIAC) 5-6.25 MG tablet Take 2 tablets by mouth daily.     cholecalciferol (VITAMIN D) 1000 UNITS tablet Take 1,000 Units by mouth daily.     cyclobenzaprine (FLEXERIL) 10 MG tablet Take  10 mg by mouth 3 (three) times daily as needed for muscle spasms.     folic acid (FOLVITE) 1 MG tablet Take 1 mg by mouth daily.     lisinopril (ZESTRIL) 40 MG tablet Take 40 mg by mouth daily.     methotrexate 2.5 MG tablet Take 20 mg by mouth every Monday. Takes 8 tablets     omeprazole (PRILOSEC) 20 MG capsule Take 20 mg by mouth as needed.      predniSONE (DELTASONE) 10 MG tablet Take 10 mg by mouth daily with breakfast.     Tiotropium Bromide-Olodaterol (STIOLTO RESPIMAT) 2.5-2.5 MCG/ACT AERS Inhale 2 puffs into the lungs daily. (Patient taking differently: Inhale 2 puffs into the lungs daily as needed (Shortness of breath).) 4 g 0   traMADol (ULTRAM) 50 MG tablet Take 1 tablet (50 mg total) by mouth every 8 (eight) hours as needed. 15 tablet 0   No current facility-administered  medications for this visit.    Review of Systems  Constitutional: negative Eyes: negative Ears, nose, mouth, throat, and face: negative Respiratory: positive for cough and dyspnea on exertion Cardiovascular: negative Gastrointestinal: negative Genitourinary:negative Integument/breast: negative Hematologic/lymphatic: negative Musculoskeletal:positive for arthralgias Neurological: negative Behavioral/Psych: negative Endocrine: negative Allergic/Immunologic: negative  Physical Exam  HQP:RFFMB, healthy, no distress, well nourished, well developed, and anxious SKIN: skin color, texture, turgor are normal, no rashes or significant lesions HEAD: Normocephalic, No masses, lesions, tenderness or abnormalities EYES: normal, PERRLA EARS: External ears normal, Canals clear OROPHARYNX:no exudate, no erythema, and lips, buccal mucosa, and tongue normal  NECK: supple, no adenopathy, no JVD LYMPH:  no palpable lymphadenopathy, no hepatosplenomegaly BREAST:not examined LUNGS: clear to auscultation , and palpation HEART: regular rate & rhythm, no murmurs, and no gallops ABDOMEN:abdomen soft, non-tender, normal  bowel sounds, and no masses or organomegaly BACK: No CVA tenderness, Range of motion is normal EXTREMITIES:no joint deformities, effusion, or inflammation, no edema  NEURO: alert & oriented x 3 with fluent speech, no focal motor/sensory deficits  PERFORMANCE STATUS: ECOG 1  LABORATORY DATA: Lab Results  Component Value Date   WBC 5.0 05/12/2021   HGB 11.2 (L) 05/12/2021   HCT 35.3 (L) 05/12/2021   MCV 93.4 05/12/2021   PLT 230 05/12/2021      Chemistry      Component Value Date/Time   NA 138 05/12/2021 0656   NA 146 (H) 10/26/2018 0816   K 3.7 05/12/2021 0656   CL 100 05/12/2021 0656   CO2 30 05/12/2021 0656   BUN 10 05/12/2021 0656   BUN 17 10/26/2018 0816   CREATININE 0.66 05/12/2021 0656      Component Value Date/Time   CALCIUM 9.8 05/12/2021 0656   ALKPHOS 47 09/23/2018 0528   AST 15 09/23/2018 0528   ALT 13 09/23/2018 0528   BILITOT 1.3 (H) 09/23/2018 0528       RADIOGRAPHIC STUDIES: MR BRAIN W WO CONTRAST  Result Date: 05/21/2021 CLINICAL DATA:  Non-small-cell lung cancer, staging EXAM: MRI HEAD WITHOUT AND WITH CONTRAST TECHNIQUE: Multiplanar, multiecho pulse sequences of the brain and surrounding structures were obtained without and with intravenous contrast. CONTRAST:  78mL GADAVIST GADOBUTROL 1 MMOL/ML IV SOLN COMPARISON:  Brain MRI 11/22/2018 FINDINGS: Brain: There is no evidence of acute intracranial hemorrhage, extra-axial fluid collection, or acute infarct. There is mild parenchymal volume loss with commensurate enlargement of the ventricular system, unchanged. Confluent areas of FLAIR signal abnormality in the subcortical and periventricular white matter are nonspecific but likely reflect sequela of chronic white matter microangiopathy, unchanged. There is a remote lacunar infarct in the right thalamus, unchanged. Multiple punctate chronic microhemorrhages are seen in the brainstem and basal ganglia in a distribution suggesting hypertensive etiology, unchanged.  There is no mass lesion. There is no abnormal enhancement. There is no midline shift. Vascular: Normal flow voids. Skull and upper cervical spine: Normal marrow signal. Sinuses/Orbits: The paranasal sinuses are clear. Bilateral lens implants are in place. The globes and orbits are otherwise unremarkable. Other: None. IMPRESSION: 1. No evidence of intracranial metastatic disease. 2. Stable parenchymal volume loss, remote right thalamic lacunar infarct, and chronic white matter microangiopathy. 3. Unchanged foci of chronic microhemorrhage in a distribution suggesting hypertensive etiology. Electronically Signed   By: Valetta Mole M.D.   On: 05/21/2021 12:09   DG Chest Port 1 View  Result Date: 05/12/2021 CLINICAL DATA:  Post bronch today in a 75 year old female. EXAM: PORTABLE CHEST 1 VIEW COMPARISON:  March 28, 2021.  FINDINGS: Cardiomediastinal contours and hilar structures are stable. EKG leads project over the chest. Nodule again seen in the RIGHT mid chest. Marker for biopsy and or fiducial marker has been placed likely via bronchoscopy by report in the lung medial to the nodule. No pneumothorax. No consolidation. No pleural effusion. On limited assessment no acute skeletal process. IMPRESSION: No pneumothorax status post bronchoscopy and fiducial marker placement. Nodule again seen in the RIGHT mid chest. Electronically Signed   By: Zetta Bills M.D.   On: 05/12/2021 10:32   DG C-ARM BRONCHOSCOPY  Result Date: 05/12/2021 C-ARM BRONCHOSCOPY: Fluoroscopy was utilized by the requesting physician.  No radiographic interpretation.    ASSESSMENT: This is a very pleasant 75 years old African-American female with stage Ia (T1b, N0, M0) non-small cell lung cancer presented with right upper lobe lung nodule diagnosed in September 2022.   PLAN: I had a lengthy discussion with the patient and her son-in-law today about her current disease stage, prognosis and treatment options. I personally and independently  reviewed the scan images and discussed the result and showed the images to the patient and her son-in-law. The patient has a history of COPD but she quit smoking 6 years ago and potentially could be a surgical candidate for resection. I will refer her to cardiothoracic surgery first for evaluation and discussion of the surgical option. If the patient is not a good surgical candidate because of her comorbidities or COPD, she will be referred to radiation oncology for consideration of SBRT to the right upper lobe lung nodule. I will arrange for the patient to come back for follow-up visit in 4 months for evaluation and repeat CT scan of the chest for restaging of her disease. For the hypertension she has whitecoat syndrome and her blood pressure is significantly elevated today.  We will repeat her measurement today and if it is still very high, we will send the patient to the emergency department or consider her for treatment with clonidine 0.2 mg x 1 before leaving the clinic today with recommendation to follow-up with her primary care physician as soon as possible for adjustment of her medication and close monitoring. The patient and her son-in-law agreed to the current plan. She was advised to call immediately if she has any other concerning symptoms in the interval. The patient voices understanding of current disease status and treatment options and is in agreement with the current care plan.  All questions were answered. The patient knows to call the clinic with any problems, questions or concerns. We can certainly see the patient much sooner if necessary.  Thank you so much for allowing me to participate in the care of Heather Ellis. I will continue to follow up the patient with you and assist in her care.  The total time spent in the appointment was 60 minutes.  Disclaimer: This note was dictated with voice recognition software. Similar sounding words can inadvertently be transcribed and  may not be corrected upon review.   Eilleen Kempf May 29, 2021, 1:53 PM

## 2021-05-30 ENCOUNTER — Telehealth: Payer: Self-pay | Admitting: Internal Medicine

## 2021-05-30 NOTE — Telephone Encounter (Signed)
Scheduled appt per 9/29 los - mailed letter with appt date and time

## 2021-06-02 DIAGNOSIS — Z0289 Encounter for other administrative examinations: Secondary | ICD-10-CM

## 2021-06-07 DIAGNOSIS — Z8249 Family history of ischemic heart disease and other diseases of the circulatory system: Secondary | ICD-10-CM | POA: Diagnosis not present

## 2021-06-07 DIAGNOSIS — D8481 Immunodeficiency due to conditions classified elsewhere: Secondary | ICD-10-CM | POA: Diagnosis not present

## 2021-06-07 DIAGNOSIS — J45909 Unspecified asthma, uncomplicated: Secondary | ICD-10-CM | POA: Diagnosis not present

## 2021-06-07 DIAGNOSIS — C3490 Malignant neoplasm of unspecified part of unspecified bronchus or lung: Secondary | ICD-10-CM | POA: Diagnosis not present

## 2021-06-07 DIAGNOSIS — I10 Essential (primary) hypertension: Secondary | ICD-10-CM | POA: Diagnosis not present

## 2021-06-07 DIAGNOSIS — M069 Rheumatoid arthritis, unspecified: Secondary | ICD-10-CM | POA: Diagnosis not present

## 2021-06-07 DIAGNOSIS — Z008 Encounter for other general examination: Secondary | ICD-10-CM | POA: Diagnosis not present

## 2021-06-07 DIAGNOSIS — Z809 Family history of malignant neoplasm, unspecified: Secondary | ICD-10-CM | POA: Diagnosis not present

## 2021-06-07 DIAGNOSIS — M1990 Unspecified osteoarthritis, unspecified site: Secondary | ICD-10-CM | POA: Diagnosis not present

## 2021-06-07 DIAGNOSIS — Z79631 Long term (current) use of antimetabolite agent: Secondary | ICD-10-CM | POA: Diagnosis not present

## 2021-06-07 DIAGNOSIS — R69 Illness, unspecified: Secondary | ICD-10-CM | POA: Diagnosis not present

## 2021-06-10 ENCOUNTER — Ambulatory Visit: Payer: Medicare HMO | Admitting: Emergency Medicine

## 2021-06-10 ENCOUNTER — Other Ambulatory Visit: Payer: Self-pay

## 2021-06-10 ENCOUNTER — Encounter: Payer: Self-pay | Admitting: Emergency Medicine

## 2021-06-10 DIAGNOSIS — J4520 Mild intermittent asthma, uncomplicated: Secondary | ICD-10-CM

## 2021-06-10 DIAGNOSIS — C3491 Malignant neoplasm of unspecified part of right bronchus or lung: Secondary | ICD-10-CM | POA: Diagnosis not present

## 2021-06-10 LAB — CYTOLOGY - NON PAP

## 2021-06-10 NOTE — Progress Notes (Signed)
Subjective:    Patient ID: Heather Ellis, female    DOB: [birth year], 75 y.o.   MRN: [id number]  HPI  ROV 04/23/21 --follow-up visit 75 year old woman carries a history of COPD/asthma.  Past medical history also significant for polymyalgia rheumatica (prednisone 10 mg), hypertension, GERD, obesity. I saw her earlier this month for an abnormal CT scan of the chest that showed a 1.8 cm right upper lobe pulmonary nodule 5 mm right lower lobe nodule, 9 mm groundglass left upper lobe nodule.  Based on this we performed a PET scan as below.  She also had pulmonary function testing. She has SABA, but rarely needs it. She believes that her breathing is pretty good unless she is significantly exerting - stairs, etc.   PFT today reviewed by me show evidence for severe obstruction without a bronchodilator response, coexisting restriction based on lung volumes, decreased diffusion capacity that corrects to the normal range when adjusted for alveolar volume.  Her flow-volume loop is consistent with obstruction.  PET scan performed 04/17/2021 reviewed by me, shows significant hypermetabolism in the right upper lobe pulmonary nodule without any evidence of mediastinal or hilar lymphadenopathy.  Some asymmetric uptake in the posterior right nasopharynx and the tonsillar region, likely physiologic  ROV 06/10/21 --this follow-up visit for patient with a history of COPD/asthma, polymyalgia rheumatica (prednisone 10 mg), hypertension, GERD.  Her pulmonary function testing shows severe obstruction without a bronchodilator response.  She underwent navigational bronchoscopy 05/12/2021 for a right upper lobe pulmonary nodule.  Pathology showed non-small cell lung cancer.  She has seen oncology and is being considered for primary resection -will see Dr. Koleen Nimrod tomorrow. She denies any significant dyspnea at rest but does have SOB with rapid walking or stairs. Has been active. Rarely needs to use albuterol. Has not been on  a scheduled BD, wants to hold off on this.   Her pulmonary function testing shows an FEV1 of 0.69 L (43% predicted)   Review of Systems As per HPI     Objective:   Physical Exam Vitals:   06/10/21 1037  BP: 120/84  Pulse: 67  Temp: 98.2 F (36.8 C)  TempSrc: Oral  SpO2: 100%  Weight: 143 lb 6.4 oz (65 kg)  Height: 5\' 3"  (1.6 m)    Gen: Pleasant elderly woman, overweight,  in no distress,  normal affect  ENT: No lesions,  mouth clear,  oropharynx clear, no postnasal drip  Neck: No JVD, no stridor  Lungs: No use of accessory muscles, somewhat small breaths but no crackles or wheezing on normal respiration, no wheeze on forced expiration  Cardiovascular: RRR, heart sounds normal, no murmur or gallops, no peripheral edema  Musculoskeletal: No deformities, no cyanosis or clubbing  Neuro: alert, awake, non focal  Skin: Warm, no lesions or rash      Assessment & Plan:  Adenocarcinoma of right lung, stage 1 (HCC) Stage I adenocarcinoma.  She has a consult with Dr. Roxan Hockey 10/12.  She has minimal symptoms, some exertional shortness of breath, but I suspect her pulmonary function testing will make her a poor candidate for resection.  Alternatively she could pursue SBRT.  Asthma Asthma with COPD.  Talk to her today about possibly starting scheduled bronchodilator therapy.  She does have exertional symptoms, uses albuterol intermittently and it is effective.  She wants to hold off on scheduled BD therapy for now.  We will continue to discuss going forward   Baltazar Apo, MD, PhD 06/10/2021, 10:52 AM [facility] [phone] or if no answer before 7:00PM call [phone] For any issues after 7:00PM please call eLink [phone]

## 2021-06-10 NOTE — Assessment & Plan Note (Signed)
Asthma with COPD.  Talk to her today about possibly starting scheduled bronchodilator therapy.  She does have exertional symptoms, uses albuterol intermittently and it is effective.  She wants to hold off on scheduled BD therapy for now.  We will continue to discuss going forward

## 2021-06-10 NOTE — Assessment & Plan Note (Signed)
Stage I adenocarcinoma.  She has a consult with Dr. Roxan Hockey 10/12.  She has minimal symptoms, some exertional shortness of breath, but I suspect her pulmonary function testing will make her a poor candidate for resection.  Alternatively she could pursue SBRT.

## 2021-06-10 NOTE — Patient Instructions (Signed)
We reviewed your bronchoscopy results, pulmonary function testing results today. Keep your appointment on 10/12 with Dr. Roxan Hockey and your follow-up with Dr. Julien Nordmann Keep your albuterol available to use 2 puffs when needed for shortness of breath, chest tightness, wheezing We talked today about possibly starting an every day inhaler medication to see if it will help your overall breathing.  For now we will hold off but we may consider to try this at some point in the future. Follow with Dr Lamonte Sakai in 6 months or sooner if you have any problems

## 2021-06-11 ENCOUNTER — Telehealth: Payer: Self-pay | Admitting: *Deleted

## 2021-06-11 ENCOUNTER — Institutional Professional Consult (permissible substitution): Payer: Medicare HMO | Admitting: Thoracic Surgery (Cardiothoracic Vascular Surgery)

## 2021-06-11 VITALS — BP 192/102 | HR 73 | Resp 20 | Ht 63.0 in | Wt 143.0 lb

## 2021-06-11 DIAGNOSIS — L723 Sebaceous cyst: Secondary | ICD-10-CM | POA: Insufficient documentation

## 2021-06-11 DIAGNOSIS — G8929 Other chronic pain: Secondary | ICD-10-CM | POA: Insufficient documentation

## 2021-06-11 DIAGNOSIS — R911 Solitary pulmonary nodule: Secondary | ICD-10-CM | POA: Diagnosis not present

## 2021-06-11 DIAGNOSIS — R7303 Prediabetes: Secondary | ICD-10-CM | POA: Insufficient documentation

## 2021-06-11 DIAGNOSIS — J449 Chronic obstructive pulmonary disease, unspecified: Secondary | ICD-10-CM | POA: Insufficient documentation

## 2021-06-11 DIAGNOSIS — R739 Hyperglycemia, unspecified: Secondary | ICD-10-CM | POA: Insufficient documentation

## 2021-06-11 DIAGNOSIS — E559 Vitamin D deficiency, unspecified: Secondary | ICD-10-CM | POA: Insufficient documentation

## 2021-06-11 DIAGNOSIS — M5430 Sciatica, unspecified side: Secondary | ICD-10-CM | POA: Insufficient documentation

## 2021-06-11 NOTE — Telephone Encounter (Signed)
6 minute walk test:  Patient ambulated 664ft at a steady pace. Stopped twice r/t SOB. O2 saturation remained greater than 90% during walk.  Post walk VS: 192/102 73HR 97% O2

## 2021-06-11 NOTE — Progress Notes (Signed)
PCP is Heather Pretty, MD Referring Provider is Heather Bears, MD  Chief Complaint  Patient presents with   Lung Lesion    Surgical consult, Chest CT 03/28/21, PET Scan 04/17/21, MR Brain 05/21/21    HPI: [name] is sent for consultation regarding a right upper lobe lung nodule.  Heather Ellis is a 75 year old woman with a past history of tobacco abuse, COPD, asthma, anemia, reflux, osteoporosis and osteoarthritis, syncope, prediabetes, and polymyalgia rheumatica.  In July she was evaluated for shoulder pain.  A chest x-ray showed a 1.6 cm right upper lobe lung nodule.  CT on 03/28/2021 showed a 1.8 cm spiculated nodule peripheral right upper lobe there was no adenopathy.  PET/CT showed the nodule is markedly hypermetabolic.  Dr. Lamonte Ellis performed navigational bronchoscopy.  Brushings needle aspiration showed non-small cell carcinoma.  She saw Dr. Julien Ellis.  She is now referred for consideration for surgical resection.  She had pulmonary function testing which showed significant obstruction with an FEV1 of 0.69.  The FEV1 actually deteriorated with bronchodilators.  She states that she is short of breath if she walks fast.  She says if she walks slow she can go a good distance without having to stop.  She does get short of breath if she tries to walk up an incline or stairs.  She is not having any chest pain, pressure, or tightness.  Zubrod Score: At the time of surgery this patient's most appropriate activity status/level should be described as: []     0    Normal activity, no symptoms [x]     1    Restricted in physical strenuous activity but ambulatory, able to do out light work []     2    Ambulatory and capable of self care, unable to do work activities, up and about >50 % of waking hours                              []     3    Only limited self care, in bed greater than 50% of waking hours []     4    Completely disabled, no self care, confined to bed or chair []     5    Moribund  Past  Medical History:  Diagnosis Date   Acid reflux    Anemia    Asthma    COPD (chronic obstructive pulmonary disease) ([place])    H/O polymyalgia rheumatica    History of COVID-19 2020   Hypertension    OA (osteoarthritis)    Obesity    Osteoporosis    Pre-diabetes    Syncope    Syncope and collapse 08/2018   Vitamin D deficiency     Past Surgical History:  Procedure Laterality Date   ABDOMINAL HYSTERECTOMY     TOTAL ABDOMINAL HYSTERECTOMY    BRONCHIAL BIOPSY  05/12/2021   Procedure: BRONCHIAL BIOPSIES;  Surgeon: Heather Gobble, MD;  Location: [facility];  Service: Pulmonary;;   BRONCHIAL BRUSHINGS  05/12/2021   Procedure: BRONCHIAL BRUSHINGS;  Surgeon: Heather Gobble, MD;  Location: [facility];  Service: Pulmonary;;   BRONCHIAL NEEDLE ASPIRATION BIOPSY  05/12/2021   Procedure: BRONCHIAL NEEDLE ASPIRATION BIOPSIES;  Surgeon: Heather Gobble, MD;  Location: [facility];  Service: Pulmonary;;   COLONOSCOPY W/ BIOPSIES  2015   ESOPHAGOGASTRODUODENOSCOPY     FIDUCIAL MARKER PLACEMENT  05/12/2021   Procedure: FIDUCIAL MARKER PLACEMENT;  Surgeon: Heather Gobble, MD;  Location:  [facility];  Service: Pulmonary;;   OOPHORECTOMY     BSO   TOTAL HIP ARTHROPLASTY Left 01/16/2020   Procedure: TOTAL HIP ARTHROPLASTY ANTERIOR APPROACH;  Surgeon: Heather Butters, MD;  Location: [facility];  Service: Orthopedics;  Laterality: Left;   VIDEO BRONCHOSCOPY WITH ENDOBRONCHIAL NAVIGATION Right 05/12/2021   Procedure: ROBOTIC ASSISTED BRONCHOSCOPY WITH ENDOBRONCHIAL NAVIGATION;  Surgeon: Heather Gobble, MD;  Location: [facility];  Service: Pulmonary;  Laterality: Right;   VIDEO BRONCHOSCOPY WITH RADIAL ENDOBRONCHIAL ULTRASOUND  05/12/2021   Procedure: VIDEO BRONCHOSCOPY WITH RADIAL ENDOBRONCHIAL ULTRASOUND;  Surgeon: Heather Gobble, MD;  Location: [facility];  Service: Pulmonary;;    Family History  Problem Relation Age of Onset   Cancer Mother        THROAT- SMOKER   Diabetes Mother     Seizures Mother    Syncope episode Mother    Diabetes Sister    Heart disease Maternal Aunt    Stroke Maternal Aunt    Cancer Maternal Uncle        LUNG -    Heart disease Maternal Uncle    Stroke Maternal Uncle    Ovarian cancer Maternal Grandmother    Heart attack Father    Heart disease Father    Syncope episode Brother     Social History Social History   Tobacco Use   Smoking status: Former    Types: Cigarettes    Quit date: 10/19/2014    Years since quitting: 6.6   Smokeless tobacco: Never  Vaping Use   Vaping Use: Never used  Substance Use Topics   Alcohol use: Not Currently    Alcohol/week: 0.0 standard drinks    Comment: occasional   Drug use: No    Current Outpatient Medications  Medication Sig Dispense Refill   amLODipine (NORVASC) 5 MG tablet Take 5 mg by mouth daily.     aspirin EC 81 MG tablet Take 1 tablet (81 mg total) by mouth 2 (two) times daily. For DVT prophylaxis for 30 days after surgery. 60 tablet 0   bisoprolol-hydrochlorothiazide (ZIAC) 5-6.25 MG tablet Take 2 tablets by mouth daily.     cholecalciferol (VITAMIN D) 1000 UNITS tablet Take 1,000 Units by mouth daily.     cyclobenzaprine (FLEXERIL) 10 MG tablet Take 10 mg by mouth 3 (three) times daily as needed for muscle spasms.     folic acid (FOLVITE) 1 MG tablet Take 1 mg by mouth daily.     lisinopril (ZESTRIL) 40 MG tablet Take 40 mg by mouth daily.     methotrexate 2.5 MG tablet Take 20 mg by mouth every Monday. Takes 8 tablets     omeprazole (PRILOSEC) 20 MG capsule Take 20 mg by mouth as needed.      Tiotropium Bromide-Olodaterol (STIOLTO RESPIMAT) 2.5-2.5 MCG/ACT AERS Inhale 2 puffs into the lungs daily. 4 g 0   traMADol (ULTRAM) 50 MG tablet Take 1 tablet (50 mg total) by mouth every 8 (eight) hours as needed. 15 tablet 0   No current facility-administered medications for this visit.    Allergies  Allergen Reactions   Fosamax [Alendronate Sodium] Nausea And Vomiting    Review of  Systems  Constitutional:  Positive for fatigue. Negative for activity change and appetite change.  Eyes:  Positive for visual disturbance (Waiting for appointment with ophthalmologist).  Respiratory:  Positive for shortness of breath.   Cardiovascular:  Negative for chest pain and leg swelling.  Musculoskeletal:  Positive for arthralgias and joint swelling.  All other systems reviewed and are negative.  BP (!) 192/102 (BP Location: Left Arm, Patient Position: Sitting)   Pulse 73   Resp 20   Ht 5\' 3"  (1.6 m)   Wt 143 lb (64.9 kg)   SpO2 97% Comment: RA  BMI 25.33 kg/m  Physical Exam Constitutional:      General: She is not in acute distress.    Appearance: Normal appearance.  HENT:     Head: Normocephalic and atraumatic.  Eyes:     General: No scleral icterus.    Extraocular Movements: Extraocular movements intact.  Neck:     Vascular: No carotid bruit.  Cardiovascular:     Rate and Rhythm: Normal rate and regular rhythm.     Heart sounds: Normal heart sounds. No murmur heard. Pulmonary:     Effort: Pulmonary effort is normal. No respiratory distress.     Breath sounds: No wheezing or rales.     Comments: Diminished breath sounds bilaterally Abdominal:     General: There is no distension.     Palpations: Abdomen is soft.  Musculoskeletal:     Cervical back: Neck supple.  Lymphadenopathy:     Cervical: No cervical adenopathy.  Skin:    General: Skin is warm and dry.  Neurological:     General: No focal deficit present.     Mental Status: She is alert and oriented to person, place, and time.     Cranial Nerves: No cranial nerve deficit.    Diagnostic Tests: NUCLEAR MEDICINE PET SKULL BASE TO THIGH   TECHNIQUE: mCi F-18 FDG was injected intravenously. Full-ring PET imaging was performed from the skull base to thigh after the radiotracer. CT data was obtained and used for attenuation correction and anatomic localization.   Fasting blood glucose: 97 mg/dl    COMPARISON:  Chest CT 03/28/2021   FINDINGS: Mediastinal blood pool activity: SUV max 2.8   Liver activity: SUV max NA   NECK: No hypermetabolic lymph nodes in the neck. Asymmetric hypermetabolism identified right posterior nasopharynx and upper oropharynx with symmetric uptake in the tonsillar regions bilaterally.   Incidental CT findings: none   CHEST: Right upper lobe pulmonary lesion is markedly hypermetabolic with SUV max = 69.6. No evidence for hypermetabolic metastatic lymphadenopathy in the mediastinum or hilar regions.   Incidental CT findings: Ascending thoracic aorta measures 4 cm diameter. Mild atherosclerotic calcification is noted in the wall of the thoracic aorta. Changes of emphysema noted. Tiny pulmonary nodules identified on recent diagnostic CT chest are below size threshold for reliable resolution on PET imaging.   ABDOMEN/PELVIS: No abnormal hypermetabolic activity within the liver, pancreas, adrenal glands, or spleen. No hypermetabolic lymph nodes in the abdomen or pelvis.   Incidental CT findings: Prominent phleboliths noted in both gonadal veins. Diverticular changes noted left colon.   SKELETON: Uptake in the left shoulder region compatible with degenerative change. Nonspecific FDG uptake associated with left total hip replacement.   Incidental CT findings: none   IMPRESSION: 1. Markedly hypermetabolic right upper lobe pulmonary lesion consistent with primary bronchogenic neoplasm. No evidence for hypermetabolic metastatic disease on today's exam. 2. Asymmetric uptake in the posterior right nasopharynx with symmetric uptake uptake in the tonsillar region bilaterally. Findings are probably physiologic, but direct visualization may be warranted. 3. Degenerative uptake identified in the left shoulder consistent with degenerative disease. There is some uptake associated with the left hip replacement is well. 4.  Aortic Atherosclerois  (ICD10-170.0) .     Electronically Signed  By: Misty Stanley M.D.   On: 04/18/2021 12:04 I personally reviewed the CT and PET/CT images.  There is a 1.8 cm spiculated nodule in the posterior segment of the right upper lobe.  Markedly hypermetabolic on PET/CT.  Pulmonary function testing 04/23/2021 FVC 1.02 (49%) FEV1 0.69 (43%) FEV1 0.59 (36%) postbronchodilator DLCO 13.78 (74%) Consistent with severe COPD  6-minute walk test-672 feet (200 m), stop twice due to shortness of breath.  Oxygen levels remained greater than 90% but did become hypotensive  Impression: Heather Ellis is a 75 year old woman with a past history of tobacco abuse, COPD, asthma, anemia, reflux, osteoporosis and osteoarthritis, syncope, prediabetes, and polymyalgia rheumatica.   She was being evaluated for shoulder pain.  She was incidentally found to have a right upper lobe lung nodule.  The nodule is markedly hypermetabolic on PET/CT.  Navigational bronchoscopy and biopsy revealed non-small cell carcinoma.  She has clinical stage Ia disease although there is a slight tilt of the pleura and is possible this could be Ib.  Although technically resectable, I have significant concerns about her ability to tolerate thoracic surgery.  Her FEV1 is only 0.69 and does not improve with bronchodilators, in fact it got worse.  She also was only able to do about 200 m with a 6-minute walk test.  Both of those indicate high risk for pulmonary complications.  I think in her case a better option would be stereotactic radiation.  Plan: Follow-up with Dr. Lamonte Ellis and Dr. Julien Ellis Referral to radiation oncology  Melrose Nakayama, MD [facility] [phone]

## 2021-06-12 ENCOUNTER — Telehealth: Payer: Self-pay | Admitting: *Deleted

## 2021-06-12 DIAGNOSIS — C3491 Malignant neoplasm of unspecified part of right bronchus or lung: Secondary | ICD-10-CM

## 2021-06-12 NOTE — Telephone Encounter (Signed)
I received a notification from Dr. Roxan Hockey that patient is not a surgical candidate and to refer to [place]. Will complete referral.  I called patient with an update that rad onc will call with an appt. She verbalized understanding.

## 2021-06-16 NOTE — Progress Notes (Signed)
Thoracic Location of Tumor / Histology: non small cell carcinoma right upper lobe  Patient presented 3 months ago with symptoms of: with shoulder pain CT showed right upper lobe mass  Biopsies of right upper lobe (if applicable) revealed: non small cell carcinoma 05/12/2021   Tobacco/Marijuana/Snuff/ETOH use: Former smoker, quit 2016  Past/Anticipated interventions by cardiothoracic surgery, if any:  Dr. Roxan Hockey 06/11/2021 - She was incidentally found to have a right upper lobe lung nodule.  The nodule is markedly hypermetabolic on PET/CT.   -Navigational bronchoscopy and biopsy revealed non-small cell carcinoma.  She has clinical stage Ia disease although there is a slight tilt of the pleura and is possible this could be Ib. -Although technically resectable, I have significant concerns about her ability to tolerate thoracic surgery.  Her FEV1 is only 0.69 and does not improve with bronchodilators, in fact it got worse.  She also was only able to do about 200 m with a 6-minute walk test.  Both of those indicate high risk for pulmonary complications. -I think in her case a better option would be stereotactic radiation.    Past/Anticipated interventions by medical oncology, if any:  Dr. Julien Nordmann 05/29/2021 -I will refer her to cardiothoracic surgery first for evaluation and discussion of the surgical option. -If the patient is not a good surgical candidate because of her comorbidities or COPD, she will be referred to radiation oncology for consideration of SBRT to the right upper lobe lung nodule. -I will arrange for the patient to come back for follow-up visit in 4 months for evaluation and repeat CT scan of the chest for restaging of her disease. Follow-up 09/24/2021  Signs/Symptoms Weight changes, if any: Stable Respiratory complaints, if any: Notes some SOB with prolonged walking, longer distances. Hemoptysis, if any: Denies Pain issues, if any: Notes some chest tightness on occasion.    SAFETY ISSUES: Prior radiation? No Pacemaker/ICD?  No Possible current pregnancy? Hysterectomy Is the patient on methotrexate? Yes, polymyalgia rheumatica, Dr. Lahoma Rocker at [facility].  Current Complaints / other details:   - Son lives with her.

## 2021-06-17 ENCOUNTER — Ambulatory Visit
Admission: RE | Admit: 2021-06-17 | Discharge: 2021-06-17 | Disposition: A | Discharge status: Home / Self Care | Payer: Medicare HMO | Source: Ambulatory Visit | Attending: Radiation Oncology | Admitting: Radiation Oncology

## 2021-06-17 ENCOUNTER — Encounter: Payer: Self-pay | Admitting: Radiation Oncology

## 2021-06-17 ENCOUNTER — Other Ambulatory Visit: Payer: Self-pay

## 2021-06-17 VITALS — BP 183/81 | HR 69 | Temp 98.0°F | Resp 20 | Ht 63.0 in | Wt 144.4 lb

## 2021-06-17 DIAGNOSIS — E559 Vitamin D deficiency, unspecified: Secondary | ICD-10-CM | POA: Insufficient documentation

## 2021-06-17 DIAGNOSIS — Z7982 Long term (current) use of aspirin: Secondary | ICD-10-CM | POA: Diagnosis not present

## 2021-06-17 DIAGNOSIS — Z87891 Personal history of nicotine dependence: Secondary | ICD-10-CM | POA: Insufficient documentation

## 2021-06-17 DIAGNOSIS — M1990 Unspecified osteoarthritis, unspecified site: Secondary | ICD-10-CM | POA: Diagnosis not present

## 2021-06-17 DIAGNOSIS — Z8041 Family history of malignant neoplasm of ovary: Secondary | ICD-10-CM | POA: Insufficient documentation

## 2021-06-17 DIAGNOSIS — J449 Chronic obstructive pulmonary disease, unspecified: Secondary | ICD-10-CM | POA: Diagnosis not present

## 2021-06-17 DIAGNOSIS — Z923 Personal history of irradiation: Secondary | ICD-10-CM | POA: Insufficient documentation

## 2021-06-17 DIAGNOSIS — Z79899 Other long term (current) drug therapy: Secondary | ICD-10-CM | POA: Insufficient documentation

## 2021-06-17 DIAGNOSIS — Z8616 Personal history of COVID-19: Secondary | ICD-10-CM | POA: Diagnosis not present

## 2021-06-17 DIAGNOSIS — C3411 Malignant neoplasm of upper lobe, right bronchus or lung: Secondary | ICD-10-CM

## 2021-06-17 DIAGNOSIS — M353 Polymyalgia rheumatica: Secondary | ICD-10-CM | POA: Insufficient documentation

## 2021-06-17 DIAGNOSIS — M810 Age-related osteoporosis without current pathological fracture: Secondary | ICD-10-CM | POA: Insufficient documentation

## 2021-06-17 DIAGNOSIS — C3491 Malignant neoplasm of unspecified part of right bronchus or lung: Secondary | ICD-10-CM

## 2021-06-17 DIAGNOSIS — I10 Essential (primary) hypertension: Secondary | ICD-10-CM | POA: Diagnosis not present

## 2021-06-17 NOTE — Progress Notes (Signed)
Radiation Oncology         [phone]) [phone] ________________________________  Name: Heather Ellis        MRN: [id number]  Date of Service: 06/17/2021 DOB: Sep 11, 1945  [id number], Thayer Jew, MD  Darleen Crocker Al*     REFERRING PHYSICIAN: Darleen Crocker Al*   DIAGNOSIS: The primary encounter diagnosis was Adenocarcinoma of right lung, stage 1 ([place]). A diagnosis of Malignant neoplasm of right upper lobe of lung (HCC) was also pertinent to this visit.   HISTORY OF PRESENT ILLNESS: Heather Ellis is a 75 y.o. female seen at the request of Dr. Roxan Hockey for a new diagnosis of an early stage right upper lobe non-small cell lung cancer.  The patient was being evaluated for pain in her shoulder in the summer and the chest x-ray at that time incidentally showed a 1.6 cm right upper lobe nodule.  Further imaging with CT scan on 03/28/2021 showed a 1.8 cm spiculated nodule in the peripheral aspect of the right upper lobe with no evidence of adenopathy.  CT pelvis with contrast showed bilateral inguinal hernias and no findings of metastatic disease.  A PET scan on 04/17/2021 showed markedly hypermetabolic right upper lobe lesion consistent with primary neoplasm with an SUV max of 18.2.  No evidence of metastatic disease was identified but asymmetric uptake in the posterior right nasopharynx with symmetric changes in the tonsillar region were felt to be physiologic.  Degenerative uptake was seen in the left shoulder and left hip replacement site as well, and at bronchoscopy on 05/12/2021 with Dr. Lamonte Sakai identified malignant cells in the right upper lobe fine-needle aspirate and brushings consistent with non-small cell carcinoma favoring adenocarcinoma.  She met with Dr. Roxan Hockey and her pulmonary function testing was felt to be high risk for surgical resection.  She also has a history of polymyalgia  rheumatica and takes methotrexate under the care of Dr. Kathlene November.      PREVIOUS RADIATION THERAPY:  No   PAST MEDICAL HISTORY:  Past Medical History:  Diagnosis Date   Acid reflux    Anemia    Asthma    COPD (chronic obstructive pulmonary disease) ([place])    H/O polymyalgia rheumatica    History of COVID-19 2020   Hypertension    OA (osteoarthritis)    Obesity    Osteoporosis    Pre-diabetes    Syncope    Syncope and collapse 08/2018   Vitamin D deficiency        PAST SURGICAL HISTORY: Past Surgical History:  Procedure Laterality Date   ABDOMINAL HYSTERECTOMY     TOTAL ABDOMINAL HYSTERECTOMY    BRONCHIAL BIOPSY  05/12/2021   Procedure: BRONCHIAL BIOPSIES;  Surgeon: Collene Gobble, MD;  Location: [facility];  Service: Pulmonary;;   BRONCHIAL BRUSHINGS  05/12/2021   Procedure: BRONCHIAL BRUSHINGS;  Surgeon: Collene Gobble, MD;  Location: [facility];  Service: Pulmonary;;   BRONCHIAL NEEDLE ASPIRATION BIOPSY  05/12/2021   Procedure: BRONCHIAL NEEDLE ASPIRATION BIOPSIES;  Surgeon: Collene Gobble, MD;  Location: [facility];  Service: Pulmonary;;   COLONOSCOPY W/ BIOPSIES  2015   ESOPHAGOGASTRODUODENOSCOPY     FIDUCIAL MARKER PLACEMENT  05/12/2021   Procedure: FIDUCIAL MARKER PLACEMENT;  Surgeon: Collene Gobble, MD;  Location: [facility];  Service: Pulmonary;;   OOPHORECTOMY     BSO   TOTAL HIP ARTHROPLASTY Left 01/16/2020   Procedure: TOTAL HIP ARTHROPLASTY ANTERIOR APPROACH;  Surgeon: Renette Butters, MD;  Location: [facility];  Service: Orthopedics;  Laterality: Left;  VIDEO BRONCHOSCOPY WITH ENDOBRONCHIAL NAVIGATION Right 05/12/2021   Procedure: ROBOTIC ASSISTED BRONCHOSCOPY WITH ENDOBRONCHIAL NAVIGATION;  Surgeon: Collene Gobble, MD;  Location: [facility];  Service: Pulmonary;  Laterality: Right;   VIDEO BRONCHOSCOPY WITH RADIAL ENDOBRONCHIAL ULTRASOUND  05/12/2021   Procedure: VIDEO BRONCHOSCOPY WITH RADIAL ENDOBRONCHIAL ULTRASOUND;  Surgeon: Collene Gobble, MD;  Location: [facility];  Service: Pulmonary;;     FAMILY HISTORY:  Family History  Problem  Relation Age of Onset   Cancer Mother        THROAT- SMOKER   Diabetes Mother    Seizures Mother    Syncope episode Mother    Diabetes Sister    Heart disease Maternal Aunt    Stroke Maternal Aunt    Cancer Maternal Uncle        LUNG -    Heart disease Maternal Uncle    Stroke Maternal Uncle    Ovarian cancer Maternal Grandmother    Heart attack Father    Heart disease Father    Syncope episode Brother      SOCIAL HISTORY:  reports that she quit smoking about 6 years ago. Her smoking use included cigarettes. She has never used smokeless tobacco. She reports that she does not currently use alcohol. She reports that she does not use drugs. The patient is separated and lives in [place].    ALLERGIES: Fosamax [alendronate sodium]   MEDICATIONS:  Current Outpatient Medications  Medication Sig Dispense Refill   amLODipine (NORVASC) 5 MG tablet Take 5 mg by mouth daily.     aspirin EC 81 MG tablet Take 1 tablet (81 mg total) by mouth 2 (two) times daily. For DVT prophylaxis for 30 days after surgery. 60 tablet 0   bisoprolol-hydrochlorothiazide (ZIAC) 5-6.25 MG tablet Take 2 tablets by mouth daily.     cholecalciferol (VITAMIN D) 1000 UNITS tablet Take 1,000 Units by mouth daily.     cyclobenzaprine (FLEXERIL) 10 MG tablet Take 10 mg by mouth 3 (three) times daily as needed for muscle spasms.     folic acid (FOLVITE) 1 MG tablet Take 1 mg by mouth daily.     lisinopril (ZESTRIL) 40 MG tablet Take 40 mg by mouth daily.     methotrexate 2.5 MG tablet Take 20 mg by mouth every Monday. Takes 8 tablets     omeprazole (PRILOSEC) 20 MG capsule Take 20 mg by mouth as needed.      Tiotropium Bromide-Olodaterol (STIOLTO RESPIMAT) 2.5-2.5 MCG/ACT AERS Inhale 2 puffs into the lungs daily. 4 g 0   traMADol (ULTRAM) 50 MG tablet Take 1 tablet (50 mg total) by mouth every 8 (eight) hours as needed. 15 tablet 0   No current facility-administered medications for this encounter.     REVIEW OF  SYSTEMS: On review of systems, the patient reports that she is doing okay. She reports her breathing is stable but if she walks long distances she has to do so very slowly due to feeling short of breath. She reports she occasionally has chest tightness but denies any current pain. She reports occasional dry cough but no mucous or hemoptysis or any recent changes in her weight. No other complaints are verbalized.      PHYSICAL EXAM:  Wt Readings from Last 3 Encounters:  06/17/21 144 lb 6.4 oz (65.5 kg)  06/11/21 143 lb (64.9 kg)  06/10/21 143 lb 6.4 oz (65 kg)   Temp Readings from Last 3 Encounters:  06/17/21 98 F (36.7 C)  06/10/21 98.2  F (36.8 C) (Oral)  05/29/21 98 F (36.7 C) (Tympanic)   BP Readings from Last 3 Encounters:  06/17/21 (!) 183/81  06/11/21 (!) 192/102  06/10/21 120/84   Pulse Readings from Last 3 Encounters:  06/17/21 69  06/11/21 73  06/10/21 67   Pain Assessment Pain Score: 0-No pain/10  In general this is a well appearing elderly African American female in no acute distress. She's alert and oriented x4 and appropriate throughout the examination. Cardiopulmonary assessment is negative for acute distress and she exhibits normal effort.     ECOG = 1  0 - Asymptomatic (Fully active, able to carry on all predisease activities without restriction)  1 - Symptomatic but completely ambulatory (Restricted in physically strenuous activity but ambulatory and able to carry out work of a light or sedentary nature. For example, light housework, office work)  2 - Symptomatic, <50% in bed during the day (Ambulatory and capable of all self care but unable to carry out any work activities. Up and about more than 50% of waking hours)  3 - Symptomatic, >50% in bed, but not bedbound (Capable of only limited self-care, confined to bed or chair 50% or more of waking hours)  4 - Bedbound (Completely disabled. Cannot carry on any self-care. Totally confined to bed or  chair)  5 - Death   Eustace Pen MM, Creech RH, Tormey DC, et al. [phone]). "Toxicity and response criteria of the [facility]". [place] Oncol. 5 (6): 649-55    LABORATORY DATA:  Lab Results  Component Value Date   WBC 4.0 05/29/2021   HGB 10.8 (L) 05/29/2021   HCT 34.0 (L) 05/29/2021   MCV 92.9 05/29/2021   PLT 228 05/29/2021   Lab Results  Component Value Date   NA 143 05/29/2021   K 3.9 05/29/2021   CL 102 05/29/2021   CO2 31 05/29/2021   Lab Results  Component Value Date   ALT 6 05/29/2021   AST 14 (L) 05/29/2021   ALKPHOS 93 05/29/2021   BILITOT 0.6 05/29/2021      RADIOGRAPHY: MR BRAIN W WO CONTRAST  Result Date: 05/21/2021 CLINICAL DATA:  Non-small-cell lung cancer, staging EXAM: MRI HEAD WITHOUT AND WITH CONTRAST TECHNIQUE: Multiplanar, multiecho pulse sequences of the brain and surrounding structures were obtained without and with intravenous contrast. CONTRAST:  56m GADAVIST GADOBUTROL 1 MMOL/ML IV SOLN COMPARISON:  Brain MRI 11/22/2018 FINDINGS: Brain: There is no evidence of acute intracranial hemorrhage, extra-axial fluid collection, or acute infarct. There is mild parenchymal volume loss with commensurate enlargement of the ventricular system, unchanged. Confluent areas of FLAIR signal abnormality in the subcortical and periventricular white matter are nonspecific but likely reflect sequela of chronic white matter microangiopathy, unchanged. There is a remote lacunar infarct in the right thalamus, unchanged. Multiple punctate chronic microhemorrhages are seen in the brainstem and basal ganglia in a distribution suggesting hypertensive etiology, unchanged. There is no mass lesion. There is no abnormal enhancement. There is no midline shift. Vascular: Normal flow voids. Skull and upper cervical spine: Normal marrow signal. Sinuses/Orbits: The paranasal sinuses are clear. Bilateral lens implants are in place. The globes and orbits are otherwise  unremarkable. Other: None. IMPRESSION: 1. No evidence of intracranial metastatic disease. 2. Stable parenchymal volume loss, remote right thalamic lacunar infarct, and chronic white matter microangiopathy. 3. Unchanged foci of chronic microhemorrhage in a distribution suggesting hypertensive etiology. Electronically Signed   By: PValetta MoleM.D.   On: 05/21/2021 12:09  IMPRESSION/PLAN: 1. Putative Stage IA2, cT1bN0M0, NSCLC, adenocarcinoma of the RUL. Dr. Lisbeth Renshaw discusses the pathology findings and reviews the nature of early stagen lung cancer. He reviews the gold standard remains surgical resection for early stage disease, however for patients who are unable to undergo surgery due to pulmonary or cardiac function, or those who decline surgery, that stereotactic body radiotherapy (SBRT) is an appropriate alternative with goals of cure. The patient is however on Methotrexate which is not compatible with radiotherapy.  We discussed that if she stopped this one week prior to, during, and until a week after treatment, this would be best. We discussed the risks, benefits, short, and long term effects of radiotherapy, as well as the curative intent, and the patient is interested in proceeding. Dr. Lisbeth Renshaw discusses the delivery and logistics of radiotherapy and anticipates a course of 3-5 fractions of radiotherapy.  Written consent is obtained and placed in the chart, a copy was provided to the patient. She will receive a call from our staff to coordinate simulation. She will also follow up in the long term with Dr. Julien Nordmann in surveillance. 2. Polymyalgia Rheumatica. Her positioning will have to depend on her range of motion for radiation and we will work with her regarding her left arm specifically. She is also in agreement to discontinue methotrexate prior to and during therapy, but can resume this a week after the conclusion of treatment. We will also reach out to Dr. Kathlene November to make sure he is in agreement with  this plan. She is also aware that if she had a flare of symptoms during radiation, she could use prednisone.    In a visit lasting 60 minutes, greater than 50% of the time was spent face to face discussing the patient's condition, in preparation for the discussion, and coordinating the patient's care.   The above documentation reflects my direct findings during this shared patient visit. Please see the separate note by Dr. Lisbeth Renshaw on this date for the remainder of the patient's plan of care.    Carola Rhine, [facility]   **Disclaimer: This note was dictated with voice recognition software. Similar sounding words can inadvertently be transcribed and this note may contain transcription errors which may not have been corrected upon publication of note.**

## 2021-06-17 NOTE — Addendum Note (Signed)
Encounter addended by: Cori Razor, RN on: 06/17/2021 11:03 AM  Actions taken: Flowsheet accepted

## 2021-06-18 DIAGNOSIS — M810 Age-related osteoporosis without current pathological fracture: Secondary | ICD-10-CM | POA: Diagnosis not present

## 2021-06-18 DIAGNOSIS — M79643 Pain in unspecified hand: Secondary | ICD-10-CM | POA: Diagnosis not present

## 2021-06-18 DIAGNOSIS — Z79899 Other long term (current) drug therapy: Secondary | ICD-10-CM | POA: Diagnosis not present

## 2021-06-18 DIAGNOSIS — Z23 Encounter for immunization: Secondary | ICD-10-CM | POA: Diagnosis not present

## 2021-06-18 DIAGNOSIS — M1990 Unspecified osteoarthritis, unspecified site: Secondary | ICD-10-CM | POA: Diagnosis not present

## 2021-06-18 DIAGNOSIS — M25512 Pain in left shoulder: Secondary | ICD-10-CM | POA: Diagnosis not present

## 2021-06-18 DIAGNOSIS — M75100 Unspecified rotator cuff tear or rupture of unspecified shoulder, not specified as traumatic: Secondary | ICD-10-CM | POA: Diagnosis not present

## 2021-06-18 DIAGNOSIS — C3490 Malignant neoplasm of unspecified part of unspecified bronchus or lung: Secondary | ICD-10-CM | POA: Diagnosis not present

## 2021-06-18 DIAGNOSIS — Z7952 Long term (current) use of systemic steroids: Secondary | ICD-10-CM | POA: Diagnosis not present

## 2021-06-18 DIAGNOSIS — M0609 Rheumatoid arthritis without rheumatoid factor, multiple sites: Secondary | ICD-10-CM | POA: Diagnosis not present

## 2021-06-19 DIAGNOSIS — K4020 Bilateral inguinal hernia, without obstruction or gangrene, not specified as recurrent: Secondary | ICD-10-CM | POA: Diagnosis not present

## 2021-06-19 DIAGNOSIS — C3491 Malignant neoplasm of unspecified part of right bronchus or lung: Secondary | ICD-10-CM | POA: Diagnosis not present

## 2021-06-20 DIAGNOSIS — M25512 Pain in left shoulder: Secondary | ICD-10-CM | POA: Diagnosis not present

## 2021-06-20 DIAGNOSIS — M25812 Other specified joint disorders, left shoulder: Secondary | ICD-10-CM | POA: Diagnosis not present

## 2021-06-24 ENCOUNTER — Ambulatory Visit
Admission: RE | Admit: 2021-06-24 | Discharge: 2021-06-24 | Disposition: A | Discharge status: Home / Self Care | Payer: Medicare HMO | Source: Ambulatory Visit | Attending: Radiation Oncology | Admitting: Radiation Oncology

## 2021-06-24 ENCOUNTER — Other Ambulatory Visit: Payer: Self-pay

## 2021-06-24 DIAGNOSIS — C3411 Malignant neoplasm of upper lobe, right bronchus or lung: Secondary | ICD-10-CM | POA: Diagnosis not present

## 2021-06-24 DIAGNOSIS — Z87891 Personal history of nicotine dependence: Secondary | ICD-10-CM | POA: Diagnosis not present

## 2021-06-24 DIAGNOSIS — Z510 Encounter for antineoplastic radiation therapy: Secondary | ICD-10-CM | POA: Insufficient documentation

## 2021-06-27 ENCOUNTER — Telehealth: Payer: Self-pay | Admitting: *Deleted

## 2021-06-27 DIAGNOSIS — Z87891 Personal history of nicotine dependence: Secondary | ICD-10-CM | POA: Diagnosis not present

## 2021-06-27 DIAGNOSIS — Z510 Encounter for antineoplastic radiation therapy: Secondary | ICD-10-CM | POA: Diagnosis not present

## 2021-06-27 DIAGNOSIS — C3411 Malignant neoplasm of upper lobe, right bronchus or lung: Secondary | ICD-10-CM | POA: Diagnosis not present

## 2021-06-27 NOTE — Telephone Encounter (Signed)
I followed up on Heather Ellis and her appts. She is set to start XRT on 11/4.  I called patient to remind them of the appt and to see if she has any questions. She did not have questions and is updated on time and place of appt.  No barriers at this time. I asked that she call if needed.

## 2021-06-30 DIAGNOSIS — I10 Essential (primary) hypertension: Secondary | ICD-10-CM | POA: Diagnosis not present

## 2021-06-30 DIAGNOSIS — R739 Hyperglycemia, unspecified: Secondary | ICD-10-CM | POA: Diagnosis not present

## 2021-06-30 DIAGNOSIS — M1990 Unspecified osteoarthritis, unspecified site: Secondary | ICD-10-CM | POA: Diagnosis not present

## 2021-06-30 DIAGNOSIS — J449 Chronic obstructive pulmonary disease, unspecified: Secondary | ICD-10-CM | POA: Diagnosis not present

## 2021-07-02 ENCOUNTER — Ambulatory Visit
Admission: RE | Admit: 2021-07-02 | Discharge: 2021-07-02 | Disposition: A | Discharge status: Home / Self Care | Payer: Medicare HMO | Source: Ambulatory Visit | Attending: Radiation Oncology | Admitting: Radiation Oncology

## 2021-07-02 ENCOUNTER — Other Ambulatory Visit: Payer: Self-pay

## 2021-07-02 DIAGNOSIS — C3411 Malignant neoplasm of upper lobe, right bronchus or lung: Secondary | ICD-10-CM | POA: Diagnosis not present

## 2021-07-02 DIAGNOSIS — Z510 Encounter for antineoplastic radiation therapy: Secondary | ICD-10-CM | POA: Diagnosis not present

## 2021-07-04 ENCOUNTER — Other Ambulatory Visit: Payer: Self-pay

## 2021-07-04 ENCOUNTER — Ambulatory Visit
Admission: RE | Admit: 2021-07-04 | Discharge: 2021-07-04 | Disposition: A | Discharge status: Home / Self Care | Payer: Medicare HMO | Source: Ambulatory Visit | Attending: Radiation Oncology | Admitting: Radiation Oncology

## 2021-07-04 DIAGNOSIS — C3411 Malignant neoplasm of upper lobe, right bronchus or lung: Secondary | ICD-10-CM | POA: Diagnosis not present

## 2021-07-04 DIAGNOSIS — Z510 Encounter for antineoplastic radiation therapy: Secondary | ICD-10-CM | POA: Diagnosis not present

## 2021-07-07 ENCOUNTER — Ambulatory Visit: Payer: Medicare HMO | Admitting: Radiation Oncology

## 2021-07-09 ENCOUNTER — Encounter: Payer: Self-pay | Admitting: Radiation Oncology

## 2021-07-09 ENCOUNTER — Ambulatory Visit
Admission: RE | Admit: 2021-07-09 | Discharge: 2021-07-09 | Disposition: A | Discharge status: Home / Self Care | Payer: Medicare HMO | Source: Ambulatory Visit | Attending: Radiation Oncology | Admitting: Radiation Oncology

## 2021-07-09 ENCOUNTER — Other Ambulatory Visit: Payer: Self-pay

## 2021-07-09 DIAGNOSIS — Z87891 Personal history of nicotine dependence: Secondary | ICD-10-CM | POA: Diagnosis not present

## 2021-07-09 DIAGNOSIS — C3411 Malignant neoplasm of upper lobe, right bronchus or lung: Secondary | ICD-10-CM | POA: Diagnosis not present

## 2021-07-09 DIAGNOSIS — Z510 Encounter for antineoplastic radiation therapy: Secondary | ICD-10-CM | POA: Diagnosis not present

## 2021-07-11 ENCOUNTER — Ambulatory Visit: Payer: Medicare HMO | Admitting: Radiation Oncology

## 2021-07-11 NOTE — Progress Notes (Signed)
                                                                                                                                                             Patient Name: Heather Ellis MRN: [id number] DOB: 04-05-1946 Referring Physician: [place] Date of Service: 07/09/2021 [facility], [place]                                                        End Of Treatment Note  Diagnoses: C34.11-Malignant neoplasm of upper lobe, right bronchus or lung  Cancer Staging:   Putative Stage IA2, cT1bN0M0, NSCLC, adenocarcinoma of the RUL  Intent: Curative  Radiation Treatment Dates: 07/02/2021 through 07/09/2021 Site Technique Total Dose (Gy) Dose per Fx (Gy) Completed Fx Beam Energies  Lung, Right: Lung_Rt IMRT 54/54 18 3/3 6XFFF   Narrative: The patient tolerated radiation therapy relatively well.   Plan: The patient will receive a call in about one month from the radiation oncology department. She will continue follow up with Dr. Julien Nordmann as well.   ________________________________________________    Carola Rhine, [facility]

## 2021-07-22 DIAGNOSIS — M25512 Pain in left shoulder: Secondary | ICD-10-CM | POA: Diagnosis not present

## 2021-07-22 DIAGNOSIS — M19012 Primary osteoarthritis, left shoulder: Secondary | ICD-10-CM | POA: Diagnosis not present

## 2021-07-23 ENCOUNTER — Telehealth: Payer: Self-pay | Admitting: *Deleted

## 2021-07-23 NOTE — Telephone Encounter (Signed)
Received vm message from patient requesting to speak to Dr. Lisbeth Renshaw.  Please return her call

## 2021-08-04 ENCOUNTER — Ambulatory Visit
Admission: RE | Admit: 2021-08-04 | Discharge: 2021-08-04 | Disposition: A | Discharge status: Home / Self Care | Payer: Medicare HMO | Source: Ambulatory Visit | Attending: Internal Medicine | Admitting: Internal Medicine

## 2021-08-04 DIAGNOSIS — C3491 Malignant neoplasm of unspecified part of right bronchus or lung: Secondary | ICD-10-CM

## 2021-08-04 NOTE — Progress Notes (Signed)
  Radiation Oncology         [phone] ________________________________  Name: Heather Ellis MRN: [id number]  Date of Service: 08/04/2021  DOB: Feb 22, 1946  Post Treatment Telephone Note  Diagnosis:   Putative Stage IA2, cT1bN0M0, NSCLC, adenocarcinoma of the RUL  Interval Since Last Radiation:  4 weeks   07/02/2021 through 07/09/2021 SBRT Treatment Site Technique Total Dose (Gy) Dose per Fx (Gy) Completed Fx Beam Energies  Lung, Right: RUL IMRT 54/54 18 3/3 6XFFF   Narrative:  The patient was contacted today for routine follow-up. During treatment she did very well with radiotherapy and did not have significant desquamation. She reports she is doing well and looking forward to having shoulder surgery on the left. She reports her breathing is stable if not possibly improved since we met. She still gets short of breath with exertion which she reports is more at baseline. No other complaints are verbalized.   Impression/Plan: 1. Putative Stage IA2, cT1bN0M0, NSCLC, adenocarcinoma of the RUL. The patient has been doing well since completion of radiotherapy. We discussed that we would be happy to continue to follow her as needed, but she will also continue to follow up with Dr. Julien Nordmann in medical oncology.   2. Arthritis of left shoulder. She will proceed as scheduled in January 2023 with Dr. Stann Mainland for arthroscopic left shoulder surgery.    Carola Rhine, PAC

## 2021-08-13 DIAGNOSIS — M79643 Pain in unspecified hand: Secondary | ICD-10-CM | POA: Diagnosis not present

## 2021-08-13 DIAGNOSIS — Z7952 Long term (current) use of systemic steroids: Secondary | ICD-10-CM | POA: Diagnosis not present

## 2021-08-13 DIAGNOSIS — M810 Age-related osteoporosis without current pathological fracture: Secondary | ICD-10-CM | POA: Diagnosis not present

## 2021-08-13 DIAGNOSIS — M0609 Rheumatoid arthritis without rheumatoid factor, multiple sites: Secondary | ICD-10-CM | POA: Diagnosis not present

## 2021-08-13 DIAGNOSIS — I10 Essential (primary) hypertension: Secondary | ICD-10-CM | POA: Diagnosis not present

## 2021-08-13 DIAGNOSIS — M75100 Unspecified rotator cuff tear or rupture of unspecified shoulder, not specified as traumatic: Secondary | ICD-10-CM | POA: Diagnosis not present

## 2021-08-13 DIAGNOSIS — Z79899 Other long term (current) drug therapy: Secondary | ICD-10-CM | POA: Diagnosis not present

## 2021-08-13 DIAGNOSIS — Z0000 Encounter for general adult medical examination without abnormal findings: Secondary | ICD-10-CM | POA: Diagnosis not present

## 2021-08-13 DIAGNOSIS — M25512 Pain in left shoulder: Secondary | ICD-10-CM | POA: Diagnosis not present

## 2021-08-13 DIAGNOSIS — M1990 Unspecified osteoarthritis, unspecified site: Secondary | ICD-10-CM | POA: Diagnosis not present

## 2021-08-13 DIAGNOSIS — C3490 Malignant neoplasm of unspecified part of unspecified bronchus or lung: Secondary | ICD-10-CM | POA: Diagnosis not present

## 2021-08-27 NOTE — Patient Instructions (Signed)
DUE TO COVID-19 ONLY ONE VISITOR IS ALLOWED TO COME WITH YOU AND STAY IN THE WAITING ROOM ONLY DURING PRE OP AND PROCEDURE DAY OF SURGERY IF YOU ARE GOING HOME AFTER SURGERY. IF YOU ARE SPENDING THE NIGHT 2 PEOPLE MAY VISIT WITH YOU IN YOUR PRIVATE ROOM AFTER SURGERY UNTIL VISITING  HOURS ARE OVER AT 800 PM AND 1  VISITOR  MAY  SPEND THE NIGHT.   YOU NEED TO HAVE A COVID 19 TEST ON_1/4______ @_9 :30______, THIS TEST MUST BE DONE BEFORE SURGERY,  SOCIAL DISTANCE AND [place] THEIR HANDS FREQUENTLY ALSO. Come to [facility] main entrance. Sit in the chairs to the right of the front door and call [place]     Your procedure is scheduled on: 09/05/21   Report to [facility] Main  Entrance   Report to admitting at  10:30 AM     Call this number if you have problems the morning of surgery [phone]   No food after midnight.    You may have clear liquid until 4:30 AM.    At 4:30 AM drink pre surgery drink.   Nothing by mouth after 4:30 AM.    CLEAR LIQUID DIET   Foods Allowed                                                                     Foods Excluded  Coffee and tea, regular and decaf                             liquids that you cannot  Plain Jell-O any favor except red or purple                                           see through such as: Fruit ices (not with fruit pulp)                                     milk, soups, orange juice  Iced Popsicles                                    All solid food Carbonated beverages, regular and diet                                    Cranberry, grape and apple juices Sports drinks like Gatorade Lightly seasoned clear broth or consume(fat free) Sugar  Sample Menu Breakfast                                Lunch  Supper Cranberry juice                    Beef broth                            Chicken broth Jell-O                                     Grape  juice                           Apple juice Coffee or tea                        Jell-O                                      Popsicle                                                Coffee or tea                        Coffee or tea  _____________________________________________________________________   BRUSH YOUR TEETH MORNING OF SURGERY AND RINSE YOUR MOUTH OUT, NO CHEWING GUM CANDY OR MINTS.     Take these medicines the morning of surgery with A SIP OF WATER:  Stop taking ___________on __________as instructed by _____________.  Stop taking ____________as directed by your Surgeon/Cardiologist.  Contact your Surgeon/Cardiologist for instructions on Anticoagulant Therapy prior to surgery.   DO NOT TAKE ANY DIABETIC MEDICATIONS DAY OF YOUR SURGERY                               You may not have any metal on your body including hair pins and              piercings  Do not wear jewelry, make-up, lotions, powders or perfumes, deodorant             Do not wear nail polish on your fingernails.  Do not shave  48 hours prior to surgery.              Men may shave face and neck.   Do not bring valuables to the hospital. [place].  Contacts, dentures or bridgework may not be worn into surgery.  Leave suitcase in the car. After surgery it may be brought to your room.     Patients discharged the day of surgery will not be allowed to drive home. IF YOU ARE HAVING SURGERY AND GOING HOME THE SAME DAY, YOU MUST HAVE AN ADULT TO DRIVE YOU HOME AND BE WITH YOU FOR 24 HOURS. YOU MAY GO HOME BY TAXI OR UBER OR ORTHERWISE, BUT AN ADULT MUST ACCOMPANY YOU HOME AND STAY WITH YOU FOR 24 HOURS.  Name and phone number of your driver:  Special Instructions: N/A  Please read over the following fact sheets you were given: _____________________________________________________________________ [facility]- Preparing for Total Shoulder Arthroplasty     Before surgery, you can play an important role. Because skin is not sterile, your skin needs to be as free of germs as possible. You can reduce the number of germs on your skin by using the following products. Benzoyl Peroxide Gel Reduces the number of germs present on the skin Applied twice a day to shoulder area starting two days before surgery    ==================================================================  Please follow these instructions carefully:  BENZOYL PEROXIDE 5% GEL  Please do not use if you have an allergy to benzoyl peroxide.   If your skin becomes reddened/irritated stop using the benzoyl peroxide.  Starting two days before surgery, apply as follows: Apply benzoyl peroxide in the morning and at night. Apply after taking a shower. If you are not taking a shower clean entire shoulder front, back, and side along with the armpit with a clean wet washcloth.  Place a quarter-sized dollop on your shoulder and rub in thoroughly, making sure to cover the front, back, and side of your shoulder, along with the armpit.   2 days before ____ AM   ____ PM              1 day before ____ AM   ____ PM                         Do this twice a day for two days.  (Last application is the night before surgery, AFTER using the CHG soap as described below).  Do NOT apply benzoyl peroxide gel on the day of surgery.             [place] - Preparing for Surgery Before surgery, you can play an important role.  Because skin is not sterile, your skin needs to be as free of germs as possible.  You can reduce the number of germs on your skin by washing with CHG (chlorahexidine gluconate) soap before surgery.  CHG is an antiseptic cleaner which kills germs and bonds with the skin to continue killing germs even after washing. Please DO NOT use if you have an allergy to CHG or antibacterial soaps.  If your skin becomes reddened/irritated stop using the CHG and inform your nurse when you arrive at  Short Stay. Do not shave (including legs and underarms) for at least 48 hours prior to the first CHG shower.  You may shave your face/neck. Please follow these instructions carefully:  1.  Shower with CHG Soap the night before surgery and the  morning of Surgery.  2.  If you choose to wash your hair, wash your hair first as usual with your  normal  shampoo.  3.  After you shampoo, rinse your hair and body thoroughly to remove the  shampoo.                           4.  Use CHG as you would any other liquid soap.  You can apply chg directly  to the skin and wash                       Gently with a scrungie or clean washcloth.  5.  Apply the CHG Soap to your body ONLY FROM THE NECK DOWN.   Do not use on face/ open  Wound or open sores. Avoid contact with eyes, ears mouth and genitals (private parts).                       Wash face,  Genitals (private parts) with your normal soap.             6.  Wash thoroughly, paying special attention to the area where your surgery  will be performed.  7.  Thoroughly rinse your body with warm water from the neck down.  8.  DO NOT shower/wash with your normal soap after using and rinsing off  the CHG Soap.                9.  Pat yourself dry with a clean towel.            10.  Wear clean pajamas.            11.  Place clean sheets on your bed the night of your first shower and do not  sleep with pets. Day of Surgery : Do not apply any lotions/deodorants the morning of surgery.  Please wear clean clothes to the hospital/surgery center.  FAILURE TO FOLLOW THESE INSTRUCTIONS MAY RESULT IN THE CANCELLATION OF YOUR SURGERY PATIENT SIGNATURE_________________________________  NURSE SIGNATURE__________________________________  ________________________________________________________________________

## 2021-08-28 ENCOUNTER — Telehealth: Payer: Self-pay | Admitting: Internal Medicine

## 2021-08-28 ENCOUNTER — Encounter (HOSPITAL_COMMUNITY)
Admission: RE | Admit: 2021-08-28 | Discharge: 2021-08-28 | Disposition: A | Discharge status: Home / Self Care | Payer: Medicare HMO | Source: Ambulatory Visit | Attending: Internal Medicine | Admitting: Internal Medicine

## 2021-08-28 NOTE — Telephone Encounter (Signed)
R/s per prov pal, pt aware 

## 2021-09-02 ENCOUNTER — Other Ambulatory Visit: Payer: Self-pay | Admitting: Internal Medicine

## 2021-09-02 DIAGNOSIS — Z1231 Encounter for screening mammogram for malignant neoplasm of breast: Secondary | ICD-10-CM

## 2021-09-03 ENCOUNTER — Encounter (HOSPITAL_COMMUNITY): Payer: Medicare HMO

## 2021-09-05 ENCOUNTER — Encounter (HOSPITAL_COMMUNITY): Admission: RE | Payer: Self-pay | Source: Home / Self Care

## 2021-09-05 ENCOUNTER — Ambulatory Visit (HOSPITAL_COMMUNITY): Admission: RE | Admit: 2021-09-05 | Payer: Medicare HMO | Source: Home / Self Care | Admitting: Orthopedic Surgery

## 2021-09-05 SURGERY — ARTHROPLASTY, SHOULDER, TOTAL, REVERSE
Anesthesia: Choice | Site: Shoulder | Laterality: Left

## 2021-09-10 NOTE — Progress Notes (Addendum)
COVID swab appointment: 09/17/21 @ 0845  COVID Vaccine Completed: yes x3 Date COVID Vaccine completed: 09/13/19, 10/11/19 Has received booster: 12/04/20 COVID vaccine manufacturer: [place]   Date of COVID positive in last 90 days: no  PCP - [name], MD Cardiologist - n/a  Chest x-ray - 05/12/21 Epic EKG - 03/28/21 Epic Stress Test - n/a ECHO - 09/23/18 Epic Cardiac Cath - n/a Pacemaker/ICD device last checked: n/a Spinal Cord Stimulator: n/a  Sleep Study - n/a CPAP -   Fasting Blood Sugar - pre no checks at home or medication Checks Blood Sugar _____ times a day  Blood Thinner Instructions: Aspirin Instructions: ASA 81, no instructions. Pt will call PCP Last Dose:  Activity level: Can go up a flight of stairs and perform activities of daily living without stopping and without symptoms of chest pain. SOB with cleaning like mopping and walking long distances. Pt reports she has to stop to catch her breath.    Anesthesia review: HTN, asthma, COPD, pre DM. BP at PAT 200/107, recheck 199/98. Patient states she took her BP medications this morning and denies any symptoms other than pain in her shoulder. Instructed patient to call her PCP right after this appointment  Patient denies shortness of breath, fever, cough and chest pain at PAT appointment   Patient verbalized understanding of instructions that were given to them at the PAT appointment. Patient was also instructed that they will need to review over the PAT instructions again at home before surgery.

## 2021-09-10 NOTE — Patient Instructions (Addendum)
DUE TO COVID-19 ONLY ONE VISITOR IS ALLOWED TO COME WITH YOU AND STAY IN THE WAITING ROOM ONLY DURING PRE OP AND PROCEDURE.   **NO VISITORS ARE ALLOWED IN THE SHORT STAY AREA OR RECOVERY ROOM!!**  IF YOU WILL BE ADMITTED INTO THE HOSPITAL YOU ARE ALLOWED ONLY TWO SUPPORT PEOPLE DURING VISITATION HOURS ONLY (7 AM -8PM)   The support person(s) must pass our screening, gel in and out, and wear a mask at all times, including in the patients room. Patients must also wear a mask when staff or their support person are in the room. Visitors GUEST BADGE MUST BE WORN VISIBLY  One adult visitor may remain with you overnight and MUST be in the room by 8 P.M.  No visitors under the age of 80. Any visitor under the age of 84 must be accompanied by an adult.    COVID SWAB TESTING MUST BE COMPLETED ON:  09/07/21 @ 8:45 AM   Site: [facility].  [place] Enter: Main Entrance have a seat in the waiting area to the right of main entrance (DO NOT [place]!!!!!) Dial: [phone] to alert staff you have arrived  You are not required to quarantine, however you are required to wear a well-fitted mask when you are out and around people not in your household.  Hand Hygiene often Do NOT share personal items Notify your provider if you are in close contact with someone who has COVID or you develop fever 100.4 or greater, new onset of sneezing, cough, sore throat, shortness of breath or body aches       Your procedure is scheduled on: 09/19/21   Report to [facility] Main Entrance    Report to admitting at 7:45 AM   Call this number if you have problems the morning of surgery [phone]   Do not eat food :After Midnight.   May have liquids until 7:30 AM day of surgery  CLEAR LIQUID DIET  Foods Allowed                                                                     Foods Excluded  Water, Black Coffee and tea (no milk or creamer)           liquids  that you cannot  Plain Jell-O in any flavor  (No red)                                    see through such as: Fruit ices (not with fruit pulp)                                            milk, soups, orange juice              Iced Popsicles (No red)  All solid food                                   Apple juices Sports drinks like Gatorade (No red) Lightly seasoned clear broth or consume(fat free) Sugar     The day of surgery:  Drink ONE (1) Pre-Surgery G2 at 7:30 AM the morning of surgery. Drink in one sitting. Do not sip.  This drink was given to you during your hospital  pre-op appointment visit. Nothing else to drink after completing the  Pre-Surgery G2.          If you have questions, please contact your surgeons office.     Oral Hygiene is also important to reduce your risk of infection.                                    Remember - BRUSH YOUR TEETH THE MORNING OF SURGERY WITH YOUR REGULAR TOOTHPASTE     Stop all vitamins and supplements 7 days before surgery   Call your doctor and ask for instructions regarding Aspirin before surgery  Take these medicines the morning of surgery with A SIP OF WATER: None                              You may not have any metal on your body including hair pins, jewelry, and body piercing             Do not wear make-up, lotions, powders, perfumes, or deodorant  Do not wear nail polish including gel and S&S, artificial/acrylic nails, or any other type of covering on natural nails including finger and toenails. If you have artificial nails, gel coating, etc. that needs to be removed by a nail salon please have this removed prior to surgery or surgery may need to be canceled/ delayed if the surgeon/ anesthesia feels like they are unable to be safely monitored.   Do not shave  48 hours prior to surgery.    Do not bring valuables to the hospital. [place].   Contacts, dentures or bridgework may not be worn into surgery.   Bring small overnight bag day of surgery.              Please read over the following fact sheets you were given: IF YOU HAVE QUESTIONS ABOUT YOUR PRE-OP INSTRUCTIONS PLEASE CALL [place] - Preparing for Surgery Before surgery, you can play an important role.  Because skin is not sterile, your skin needs to be as free of germs as possible.  You can reduce the number of germs on your skin by washing with CHG (chlorahexidine gluconate) soap before surgery.  CHG is an antiseptic cleaner which kills germs and bonds with the skin to continue killing germs even after washing. Please DO NOT use if you have an allergy to CHG or antibacterial soaps.  If your skin becomes reddened/irritated stop using the CHG and inform your nurse when you arrive at Short Stay. Do not shave (including legs and underarms) for at least 48 hours prior to the first CHG shower.  You may shave your face/neck.  Please follow these instructions  carefully:  1.  Shower with CHG Soap the night before surgery and the  morning of surgery.  2.  If you choose to wash your hair, wash your hair first as usual with your normal  shampoo.  3.  After you shampoo, rinse your hair and body thoroughly to remove the shampoo.                             4.  Use CHG as you would any other liquid soap.  You can apply chg directly to the skin and wash.  Gently with a scrungie or clean washcloth.  5.  Apply the CHG Soap to your body ONLY FROM THE NECK DOWN.   Do   not use on face/ open                           Wound or open sores. Avoid contact with eyes, ears mouth and   genitals (private parts).                       Wash face,  Genitals (private parts) with your normal soap.             6.  Wash thoroughly, paying special attention to the area where your    surgery  will be performed.  7.  Thoroughly rinse your body with warm water from the  neck down.  8.  DO NOT shower/wash with your normal soap after using and rinsing off the CHG Soap.                9.  Pat yourself dry with a clean towel.            10.  Wear clean pajamas.            11.  Place clean sheets on your bed the night of your first shower and do not  sleep with pets. Day of Surgery : Do not apply any lotions/deodorants the morning of surgery.  Please wear clean clothes to the hospital/surgery center.  FAILURE TO FOLLOW THESE INSTRUCTIONS MAY RESULT IN THE CANCELLATION OF YOUR SURGERY  PATIENT SIGNATURE_________________________________  NURSE SIGNATURE__________________________________  ________________________________________________________________________   Adam Phenix  An incentive spirometer is a tool that can help keep your lungs clear and active. This tool measures how well you are filling your lungs with each breath. Taking long deep breaths may help reverse or decrease the chance of developing breathing (pulmonary) problems (especially infection) following: A long period of time when you are unable to move or be active. BEFORE THE PROCEDURE  If the spirometer includes an indicator to show your best effort, your nurse or respiratory therapist will set it to a desired goal. If possible, sit up straight or lean slightly forward. Try not to slouch. Hold the incentive spirometer in an upright position. INSTRUCTIONS FOR USE  Sit on the edge of your bed if possible, or sit up as far as you can in bed or on a chair. Hold the incentive spirometer in an upright position. Breathe out normally. Place the mouthpiece in your mouth and seal your lips tightly around it. Breathe in slowly and as deeply as possible, raising the piston or the ball toward the top of the column. Hold your breath for 3-5 seconds or for as long as possible. Allow the piston or ball to fall to the bottom  of the column. Remove the mouthpiece from your mouth and breathe out  normally. Rest for a few seconds and repeat Steps 1 through 7 at least 10 times every 1-2 hours when you are awake. Take your time and take a few normal breaths between deep breaths. The spirometer may include an indicator to show your best effort. Use the indicator as a goal to work toward during each repetition. After each set of 10 deep breaths, practice coughing to be sure your lungs are clear. If you have an incision (the cut made at the time of surgery), support your incision when coughing by placing a pillow or rolled up towels firmly against it. Once you are able to get out of bed, walk around indoors and cough well. You may stop using the incentive spirometer when instructed by your caregiver.  RISKS AND COMPLICATIONS Take your time so you do not get dizzy or light-headed. If you are in pain, you may need to take or ask for pain medication before doing incentive spirometry. It is harder to take a deep breath if you are having pain. AFTER USE Rest and breathe slowly and easily. It can be helpful to keep track of a log of your progress. Your caregiver can provide you with a simple table to help with this. If you are using the spirometer at home, follow these instructions: [place] IF:  You are having difficultly using the spirometer. You have trouble using the spirometer as often as instructed. Your pain medication is not giving enough relief while using the spirometer. You develop fever of 100.5 F (38.1 C) or higher. SEEK IMMEDIATE MEDICAL CARE IF:  You cough up bloody sputum that had not been present before. You develop fever of 102 F (38.9 C) or greater. You develop worsening pain at or near the incision site. MAKE SURE YOU:  Understand these instructions. Will watch your condition. Will get help right away if you are not doing well or get worse. Document Released: 12/28/2006 Document Revised: 11/09/2011 Document Reviewed: 02/28/2007 ExitCare Patient Information  2014 [employer].   ________________________________________________________________________  [facility]- Preparing for Total Shoulder Arthroplasty    Before surgery, you can play an important role. Because skin is not sterile, your skin needs to be as free of germs as possible. You can reduce the number of germs on your skin by using the following products. Benzoyl Peroxide Gel Reduces the number of germs present on the skin Applied twice a day to shoulder area starting two days before surgery    ==================================================================  Please follow these instructions carefully:  BENZOYL PEROXIDE 5% GEL  Please do not use if you have an allergy to benzoyl peroxide.   If your skin becomes reddened/irritated stop using the benzoyl peroxide.  Starting two days before surgery, apply as follows: Apply benzoyl peroxide in the morning and at night. Apply after taking a shower. If you are not taking a shower clean entire shoulder front, back, and side along with the armpit with a clean wet washcloth.  Place a quarter-sized dollop on your shoulder and rub in thoroughly, making sure to cover the front, back, and side of your shoulder, along with the armpit.   2 days before ____ AM   ____ PM              1 day before ____ AM   ____ PM  Do this twice a day for two days.  (Last application is the night before surgery, AFTER using the CHG soap as described below).  Do NOT apply benzoyl peroxide gel on the day of surgery.

## 2021-09-12 ENCOUNTER — Encounter (HOSPITAL_COMMUNITY): Payer: Self-pay

## 2021-09-12 ENCOUNTER — Other Ambulatory Visit: Payer: Self-pay

## 2021-09-12 ENCOUNTER — Encounter (HOSPITAL_COMMUNITY)
Admission: RE | Admit: 2021-09-12 | Discharge: 2021-09-12 | Disposition: A | Discharge status: Home / Self Care | Payer: No Typology Code available for payment source | Source: Ambulatory Visit | Attending: Orthopedic Surgery | Admitting: Orthopedic Surgery

## 2021-09-12 VITALS — BP 199/98 | HR 78 | Temp 98.5°F | Resp 18 | Ht 63.0 in | Wt 142.0 lb

## 2021-09-12 DIAGNOSIS — M353 Polymyalgia rheumatica: Secondary | ICD-10-CM | POA: Diagnosis not present

## 2021-09-12 DIAGNOSIS — Z85118 Personal history of other malignant neoplasm of bronchus and lung: Secondary | ICD-10-CM | POA: Insufficient documentation

## 2021-09-12 DIAGNOSIS — Z01818 Encounter for other preprocedural examination: Secondary | ICD-10-CM

## 2021-09-12 DIAGNOSIS — R7303 Prediabetes: Secondary | ICD-10-CM | POA: Insufficient documentation

## 2021-09-12 DIAGNOSIS — Z923 Personal history of irradiation: Secondary | ICD-10-CM | POA: Insufficient documentation

## 2021-09-12 DIAGNOSIS — Z87891 Personal history of nicotine dependence: Secondary | ICD-10-CM | POA: Insufficient documentation

## 2021-09-12 DIAGNOSIS — I10 Essential (primary) hypertension: Secondary | ICD-10-CM | POA: Insufficient documentation

## 2021-09-12 DIAGNOSIS — Z7952 Long term (current) use of systemic steroids: Secondary | ICD-10-CM | POA: Diagnosis not present

## 2021-09-12 DIAGNOSIS — J449 Chronic obstructive pulmonary disease, unspecified: Secondary | ICD-10-CM | POA: Diagnosis not present

## 2021-09-12 DIAGNOSIS — M12812 Other specific arthropathies, not elsewhere classified, left shoulder: Secondary | ICD-10-CM | POA: Diagnosis not present

## 2021-09-12 DIAGNOSIS — Z01812 Encounter for preprocedural laboratory examination: Secondary | ICD-10-CM | POA: Diagnosis not present

## 2021-09-12 HISTORY — DX: Malignant (primary) neoplasm, unspecified: C80.1

## 2021-09-12 LAB — SURGICAL PCR SCREEN
MRSA, PCR: POSITIVE — AB
Staphylococcus aureus: POSITIVE — AB

## 2021-09-12 LAB — HEMOGLOBIN A1C
Hgb A1c MFr Bld: 5.4 % (ref 4.8–5.6)
Mean Plasma Glucose: 108.28 mg/dL

## 2021-09-12 LAB — BASIC METABOLIC PANEL
Anion gap: 8 (ref 5–15)
BUN: 11 mg/dL (ref 8–23)
CO2: 30 mmol/L (ref 22–32)
Calcium: 9.5 mg/dL (ref 8.9–10.3)
Chloride: 100 mmol/L (ref 98–111)
Creatinine, Ser: 0.5 mg/dL (ref 0.44–1.00)
GFR, Estimated: >60 mL/min (ref >60)
Glucose, Bld: 95 mg/dL (ref 70–99)
Potassium: 3.9 mmol/L (ref 3.5–5.1)
Sodium: 138 mmol/L (ref 135–145)

## 2021-09-12 LAB — CBC
HCT: 36.1 % (ref 36.0–46.0)
Hemoglobin: 11.4 g/dL — ABNORMAL LOW (ref 12.0–15.0)
MCH: 30.6 pg (ref 26.0–34.0)
MCHC: 31.6 g/dL (ref 30.0–36.0)
MCV: 96.8 fL (ref 80.0–100.0)
Platelets: 238 10*3/uL (ref 150–400)
RBC: 3.73 MIL/uL — ABNORMAL LOW (ref 3.87–5.11)
RDW: 14.9 % (ref 11.5–15.5)
WBC: 3.9 10*3/uL — ABNORMAL LOW (ref 4.0–10.5)
nRBC: 0 % (ref 0.0–0.2)

## 2021-09-12 LAB — GLUCOSE, CAPILLARY: Glucose-Capillary: 88 mg/dL (ref 70–99)

## 2021-09-15 ENCOUNTER — Encounter (HOSPITAL_COMMUNITY): Payer: Self-pay | Admitting: Physician Assistant

## 2021-09-15 DIAGNOSIS — M810 Age-related osteoporosis without current pathological fracture: Secondary | ICD-10-CM | POA: Diagnosis not present

## 2021-09-15 NOTE — Progress Notes (Addendum)
Anesthesia Chart Review   Case: [id number] Date/Time: 09/19/21 1015   Procedure: REVERSE SHOULDER ARTHROPLASTY (Left: Shoulder) - 150   Anesthesia type: Choice   Pre-op diagnosis: Left shoulder rotator cuff arthropathy   Location: [name] ROOM 08 / [facility] ORS   Surgeons: Nicholes Stairs, MD       DISCUSSION:75 y.o. former smoker with h/o HTN, asthma, COPD, adenocarcinoma RUL s/p radiotherapy, polymyalgia rheumatica (prednisone 10 mg), left shoulder rotator cuff arthropathy scheduled for above procedure 09/19/2021 with Dr. [name].   Pulmonology clearance requested.  VS: BP (!) 199/98    Pulse 78    Temp 36.9 C (Oral)    Resp 18    Ht 5\' 3"  (1.6 m)    Wt 64.4 kg    SpO2 100%    BMI 25.15 kg/m   PROVIDERS: [name], MD is PCP    LABS: Labs reviewed: Acceptable for surgery. (all labs ordered are listed, but only abnormal results are displayed)  Labs Reviewed  SURGICAL PCR SCREEN - Abnormal; Notable for the following components:      Result Value   MRSA, PCR POSITIVE (*)    Staphylococcus aureus POSITIVE (*)    All other components within normal limits  CBC - Abnormal; Notable for the following components:   WBC 3.9 (*)    RBC 3.73 (*)    Hemoglobin 11.4 (*)    All other components within normal limits  HEMOGLOBIN N6E  BASIC METABOLIC PANEL  GLUCOSE, CAPILLARY     IMAGES:   EKG: 03/28/2021 Rate 77 bpm  Sinus rhythm  Atrial premature complex  Consider left ventricular hypertrophy  CV: Echo 09/23/2018  - Left ventricle: The cavity size was normal. Wall thickness was    normal. Systolic function was normal. The estimated ejection    fraction was in the range of 55% to 60%. Wall motion was normal;    there were no regional wall motion abnormalities. Doppler    parameters are consistent with abnormal left ventricular    relaxation (grade 1 diastolic dysfunction). Doppler parameters    are consistent with high ventricular filling pressure.  - Aortic valve: There  was trivial regurgitation.  - Mitral valve: Calcified annulus. Prolapse, involving the    posterior leaflet. There was mild to moderate regurgitation.  - Pulmonary arteries: PA peak pressure: 31 mm Hg (S).  Past Medical History:  Diagnosis Date   Acid reflux    Anemia    Asthma    Cancer ([place])    Right lung   COPD (chronic obstructive pulmonary disease) (HCC)    H/O polymyalgia rheumatica    History of COVID-19 2020   Hypertension    OA (osteoarthritis)    Obesity    Osteoporosis    Pre-diabetes    Syncope    Syncope and collapse 08/2018   Vitamin D deficiency     Past Surgical History:  Procedure Laterality Date   ABDOMINAL HYSTERECTOMY     TOTAL ABDOMINAL HYSTERECTOMY    BRONCHIAL BIOPSY  05/12/2021   Procedure: BRONCHIAL BIOPSIES;  Surgeon: Collene Gobble, MD;  Location: [facility] ENDOSCOPY;  Service: Pulmonary;;   BRONCHIAL BRUSHINGS  05/12/2021   Procedure: BRONCHIAL BRUSHINGS;  Surgeon: Collene Gobble, MD;  Location: [place];  Service: Pulmonary;;   BRONCHIAL NEEDLE ASPIRATION BIOPSY  05/12/2021   Procedure: BRONCHIAL NEEDLE ASPIRATION BIOPSIES;  Surgeon: Collene Gobble, MD;  Location: [place] ENDOSCOPY;  Service: Pulmonary;;   COLONOSCOPY W/ BIOPSIES  2015   ESOPHAGOGASTRODUODENOSCOPY  FIDUCIAL MARKER PLACEMENT  05/12/2021   Procedure: FIDUCIAL MARKER PLACEMENT;  Surgeon: Collene Gobble, MD;  Location: [facility] ENDOSCOPY;  Service: Pulmonary;;   OOPHORECTOMY     BSO   TOTAL HIP ARTHROPLASTY Left 01/16/2020   Procedure: TOTAL HIP ARTHROPLASTY ANTERIOR APPROACH;  Surgeon: Renette Butters, MD;  Location: [facility] ORS;  Service: Orthopedics;  Laterality: Left;   VIDEO BRONCHOSCOPY WITH ENDOBRONCHIAL NAVIGATION Right 05/12/2021   Procedure: ROBOTIC ASSISTED BRONCHOSCOPY WITH ENDOBRONCHIAL NAVIGATION;  Surgeon: Collene Gobble, MD;  Location: [place] ENDOSCOPY;  Service: Pulmonary;  Laterality: Right;   VIDEO BRONCHOSCOPY WITH RADIAL ENDOBRONCHIAL ULTRASOUND  05/12/2021   Procedure: VIDEO  BRONCHOSCOPY WITH RADIAL ENDOBRONCHIAL ULTRASOUND;  Surgeon: Collene Gobble, MD;  Location: [facility] ENDOSCOPY;  Service: Pulmonary;;    MEDICATIONS:  aspirin EC 81 MG tablet   bisoprolol-hydrochlorothiazide (ZIAC) 5-6.25 MG tablet   carboxymethylcellulose (REFRESH PLUS) 0.5 % SOLN   cholecalciferol (VITAMIN D) 1000 UNITS tablet   folic acid (FOLVITE) 1 MG tablet   lisinopril (ZESTRIL) 40 MG tablet   Menthol, Topical Analgesic, (BIOFREEZE EX)   methotrexate 2.5 MG tablet   omeprazole (PRILOSEC) 20 MG capsule   Tiotropium Bromide-Olodaterol (STIOLTO RESPIMAT) 2.5-2.5 MCG/ACT AERS   traMADol (ULTRAM) 50 MG tablet   No current facility-administered medications for this encounter.      Heather Felix Ward, PA-C [facility] Pre-Surgical Testing [phone]

## 2021-09-16 ENCOUNTER — Telehealth: Payer: Self-pay | Admitting: Emergency Medicine

## 2021-09-16 NOTE — Telephone Encounter (Signed)
Needs to be seen for surgical clearance, can do on 09/18/21

## 2021-09-16 NOTE — Telephone Encounter (Signed)
Fax received from Dr. [name] with [facility] to perform a Left shoulder reverse arthroplasty on patient.  Patient needs surgery clearance. Patient was seen on 06/10/21 and is scheduled for OV on 09/18/21. Office protocol is a risk assessment can be sent to surgeon if patient has been seen in 60 days or less.   Sending to Derl Barrow for risk assessment or recommendations if patient needs to be seen in office prior to surgical procedure.

## 2021-09-17 ENCOUNTER — Other Ambulatory Visit: Payer: Self-pay

## 2021-09-17 ENCOUNTER — Encounter (HOSPITAL_COMMUNITY)
Admission: RE | Admit: 2021-09-17 | Discharge: 2021-09-17 | Disposition: A | Discharge status: Home / Self Care | Payer: No Typology Code available for payment source | Source: Ambulatory Visit | Attending: Orthopedic Surgery | Admitting: Orthopedic Surgery

## 2021-09-17 DIAGNOSIS — Z01818 Encounter for other preprocedural examination: Secondary | ICD-10-CM

## 2021-09-17 DIAGNOSIS — Z01812 Encounter for preprocedural laboratory examination: Secondary | ICD-10-CM | POA: Insufficient documentation

## 2021-09-17 DIAGNOSIS — Z20822 Contact with and (suspected) exposure to covid-19: Secondary | ICD-10-CM | POA: Insufficient documentation

## 2021-09-17 LAB — SARS CORONAVIRUS 2 (TAT 6-24 HRS): SARS Coronavirus 2: NEGATIVE

## 2021-09-18 ENCOUNTER — Ambulatory Visit (INDEPENDENT_AMBULATORY_CARE_PROVIDER_SITE_OTHER): Payer: No Typology Code available for payment source | Admitting: Primary Care

## 2021-09-18 ENCOUNTER — Encounter: Payer: Self-pay | Admitting: Primary Care

## 2021-09-18 DIAGNOSIS — J449 Chronic obstructive pulmonary disease, unspecified: Secondary | ICD-10-CM

## 2021-09-18 DIAGNOSIS — Z01811 Encounter for preprocedural respiratory examination: Secondary | ICD-10-CM | POA: Insufficient documentation

## 2021-09-18 NOTE — Assessment & Plan Note (Addendum)
-   Plan left rotator cuff surgery on February 24th, 2023 with Dr. Stann Mainland at [facility]. Patient is considered high risk for prolonged mechanical ventilation and/or post-op pulmonary complications based SEVERE COPD (FEV1 36%) and adenocarcinoma. Her exam was benign today, lungs were clear to ascultation. She has no acute respiratory symptoms or recent exacerbations. O2 remained > 95-96% RA on ambulation. Ultimate clearance will be decided by surgeon/anesthesiologist.    Major Pulmonary risks identified in the multifactorial risk analysis are but not limited to a) pneumonia; b) recurrent intubation risk; c) prolonged or recurrent acute respiratory failure needing mechanical ventilation; d) prolonged hospitalization; e) DVT/Pulmonary embolism; f) Acute Pulmonary edema  Recommend 1. Short duration of surgery as much as possible and avoid paralytic if possible 2. Recovery in step down or ICU with Pulmonary consultation 3. DVT prophylaxis 4. Aggressive pulmonary toilet with o2, bronchodilatation, and incentive spirometry and early ambulation

## 2021-09-18 NOTE — Patient Instructions (Addendum)
You are considered moderate-high risk for prolonged mechanical ventilation and/or post op pulmonary complications based on your lung function, hx COPD and adenocarcinoma. I will discuss with Dr. Burnis Kingfisher to get his take on surgery. After surgery recommend early ambulation and use incentive spirometer every hour.  Recommendations: Take Stiolto two puffs every day in the morning (do not use more than prescribed) Use albuterol 2 puffs every 6 hours ONLY if needed for shortness of breath  Orders: Simple walk test   Follow-up: 3-4 months with Dr. Lamonte Sakai or sooner if needed

## 2021-09-18 NOTE — Progress Notes (Signed)
@Patient  ID: Heather Ellis, female    DOB: 10-Mar-1946, 76 y.o.   MRN: [id number]  Chief Complaint  Patient presents with   surgical clearance    Surg on Feb 24. Left rotary cuff surg at [facility] by Dr Stann Mainland     Referring provider: [name], MD  HPI: 76 year old female, former smoked. PMH significant for asthma with COPD and adenocarcinoma right lung. Patient of Dr. Lamonte Sakai.   09/18/2021 Patient presents today for surgical clearance. Planning for left rotator cuff surgery with Dr. Stann Mainland on February 24th. She has hx asthma/COPD and adenocarcinoma right lung. She has some mild exertional dyspnea and a dry cough at baseline. No mucus production. She is not regularly using her Stiolto inhaler. PFTs in August 2022 showed very severe airflow obstruction/ FEV1 36%. She finished radiation in December 2022. Following with Dr. Earlie Server with oncology.     Allergies  Allergen Reactions   Fosamax [Alendronate Sodium] Nausea And Vomiting    Immunization History  Administered Date(s) Administered   Influenza, High Dose Seasonal PF 08/06/2016, 05/22/2017, 05/18/2018   Influenza, Quadrivalent, Recombinant, Inj, Pf 06/18/2021   Moderna Sars-Covid-2 Vaccination 09/13/2019, 10/11/2019, 12/04/2020   Pneumococcal Conjugate-13 08/16/2019   Tdap 12/19/2010    Past Medical History:  Diagnosis Date   Acid reflux    Anemia    Asthma    Cancer ([place])    Right lung   COPD (chronic obstructive pulmonary disease) ([place])    H/O polymyalgia rheumatica    History of COVID-19 2020   Hypertension    OA (osteoarthritis)    Obesity    Osteoporosis    Pre-diabetes    Syncope    Syncope and collapse 08/2018   Vitamin D deficiency     Tobacco History: Social History   Tobacco Use  Smoking Status Former   Types: Cigarettes   Quit date: 10/19/2014   Years since quitting: 6.9  Smokeless Tobacco Never   Counseling given: Not Answered   Outpatient Medications Prior to Visit   Medication Sig Dispense Refill   aspirin EC 81 MG tablet Take 1 tablet (81 mg total) by mouth 2 (two) times daily. For DVT prophylaxis for 30 days after surgery. (Patient taking differently: Take 81 mg by mouth daily.) 60 tablet 0   bisoprolol-hydrochlorothiazide (ZIAC) 5-6.25 MG tablet Take 2 tablets by mouth daily.     carboxymethylcellulose (REFRESH PLUS) 0.5 % SOLN 1 drop 3 (three) times daily as needed (dry eyes).     cholecalciferol (VITAMIN D) 1000 UNITS tablet Take 1,000 Units by mouth daily.     folic acid (FOLVITE) 1 MG tablet Take 1 mg by mouth daily.     lisinopril (ZESTRIL) 40 MG tablet Take 40 mg by mouth daily.     Menthol, Topical Analgesic, (BIOFREEZE EX) Apply 1 application topically daily as needed (knee pain).     methotrexate 2.5 MG tablet Take 20 mg by mouth once a week.     omeprazole (PRILOSEC) 20 MG capsule Take 20 mg by mouth daily as needed (acid reflux).     Tiotropium Bromide-Olodaterol (STIOLTO RESPIMAT) 2.5-2.5 MCG/ACT AERS Inhale 2 puffs into the lungs daily. (Patient taking differently: Inhale 2 puffs into the lungs daily as needed (shortness of breath).) 4 g 0   traMADol (ULTRAM) 50 MG tablet Take 1 tablet (50 mg total) by mouth every 8 (eight) hours as needed. 15 tablet 0   No facility-administered medications prior to visit.   Review of Systems  Review of Systems  Constitutional: Negative.   HENT: Negative.    Respiratory:  Positive for cough. Negative for chest tightness, shortness of breath and wheezing.        Dyspnea with exertion-baseline  Psychiatric/Behavioral: Negative.      Physical Exam  BP 122/68 (BP Location: Left Arm, Patient Position: Sitting, Cuff Size: Normal)    Pulse 64    Temp 98.7 F (37.1 C) (Oral)    Ht 5\' 3"  (1.6 m)    Wt 146 lb 6.4 oz (66.4 kg)    SpO2 99%    BMI 25.93 kg/m  Physical Exam Constitutional:      Appearance: Normal appearance.  HENT:     Head: Normocephalic and atraumatic.     Mouth/Throat:     Mouth:  Mucous membranes are moist.     Pharynx: Oropharynx is clear.  Cardiovascular:     Rate and Rhythm: Normal rate and regular rhythm.  Pulmonary:     Effort: Pulmonary effort is normal. No respiratory distress.     Breath sounds: Normal breath sounds. No wheezing, rhonchi or rales.  Musculoskeletal:        General: Normal range of motion.  Skin:    General: Skin is warm and dry.  Neurological:     General: No focal deficit present.     Mental Status: She is alert and oriented to person, place, and time. Mental status is at baseline.  Psychiatric:        Mood and Affect: Mood normal.        Behavior: Behavior normal.        Thought Content: Thought content normal.        Judgment: Judgment normal.     Lab Results:  CBC    Component Value Date/Time   WBC 3.9 (L) 09/12/2021 1209   RBC 3.73 (L) 09/12/2021 1209   HGB 11.4 (L) 09/12/2021 1209   HGB 10.8 (L) 05/29/2021 1348   HCT 36.1 09/12/2021 1209   PLT 238 09/12/2021 1209   PLT 228 05/29/2021 1348   MCV 96.8 09/12/2021 1209   MCH 30.6 09/12/2021 1209   MCHC 31.6 09/12/2021 1209   RDW 14.9 09/12/2021 1209   LYMPHSABS 1.8 05/29/2021 1348   MONOABS 0.4 05/29/2021 1348   EOSABS 0.1 05/29/2021 1348   BASOSABS 0.0 05/29/2021 1348    BMET    Component Value Date/Time   NA 138 09/12/2021 1209   NA 146 (H) 10/26/2018 0816   K 3.9 09/12/2021 1209   CL 100 09/12/2021 1209   CO2 30 09/12/2021 1209   GLUCOSE 95 09/12/2021 1209   BUN 11 09/12/2021 1209   BUN 17 10/26/2018 0816   CREATININE 0.50 09/12/2021 1209   CREATININE 0.70 05/29/2021 1348   CALCIUM 9.5 09/12/2021 1209   GFRNONAA >60 09/12/2021 1209   GFRNONAA >60 05/29/2021 1348   GFRAA >60 01/05/2020 1259    BNP No results found for: BNP  ProBNP No results found for: PROBNP  Imaging: No results found.   Assessment & Plan:   Chronic obstructive pulmonary disease, unspecified ([place]) - Baseline dyspnea and dry cough - Encourage patient consistently use  Stiolto Respimat 2 puff daily   Encounter for pre-operative respiratory clearance - Plan left rotator cuff surgery on February 24th, 2023 with Dr. Stann Mainland at [facility]. Patient is considered high risk for prolonged mechanical ventilation and/or post-op pulmonary complications based SEVERE COPD (FEV1 36%) and adenocarcinoma. Her exam was benign today, lungs were clear to  ascultation. She has no acute respiratory symptoms or recent exacerbations. O2 remained > 95-96% RA on ambulation. Ultimate clearance will be decided by surgeon/anesthesiologist.    Major Pulmonary risks identified in the multifactorial risk analysis are but not limited to a) pneumonia; b) recurrent intubation risk; c) prolonged or recurrent acute respiratory failure needing mechanical ventilation; d) prolonged hospitalization; e) DVT/Pulmonary embolism; f) Acute Pulmonary edema  Recommend 1. Short duration of surgery as much as possible and avoid paralytic if possible 2. Recovery in step down or ICU with Pulmonary consultation 3. DVT prophylaxis 4. Aggressive pulmonary toilet with o2, bronchodilatation, and incentive spirometry and early ambulation     1) RISK FOR PROLONGED MECHANICAL VENTILAION - > 48h  1A) Arozullah - Prolonged mech ventilation risk Arozullah Postperative Pulmonary Risk Score - for mech ventilation dependence >48h [employer], [facility] 2000, major non-cardiac surgery) Comment Score  Type of surgery - abd ao aneurysm (27), thoracic (21), neurosurgery / upper abdominal / vascular (21), neck (11) Shoulder surgery 6  Emergency Surgery - (11)  0  ALbumin < 3 or poor nutritional state - (9)  0  BUN > 30 -  (8)  0  Partial or completely dependent functional status - (7)  0  COPD -  (6)  6  Age - 60 to 69 (4), > 70  (6)  6  TOTAL  18  Risk Stratifcation scores  - < 10 (0.5%), 11-19 (1.8%), 20-27 (4.2%), 28-40 (10.1%), >40 (26.6%)  1.8%       1B) GUPTA - Prolonged Mech Vent Risk Score  source Risk  Guptal post op prolonged mech ventilation > 48h or reintubation < 30 days - ACS 2007-2008 dataset - http://lewis-perez.info/ 0.3 % Risk of mechanical ventilation for >48 hrs after surgery, or unplanned intubation ?30 days of surgery    2) RISK FOR POST OP PNEUMONIA Score source Risk  Lyndel Safe - Post Op Pnemounia risk  [employer][email or web address] 0.6 % Risk of postoperative pneumonia    R3) ISK FOR ANY POST-OP PULMONARY COMPLICATION Score source Risk  CANET/ARISCAT Score - risk for ANY/ALl pulmonary complications - > risk of in-hospital post-op pulmonary complications (composite including respiratory failure, respiratory infection, pleural effusion, atelectasis, pneumothorax, bronchospasm, aspiration pneumonitis) [email or web address] - based on age, anemia, pulse ox, resp infection prior 30d, incision site, duration of surgery, and emergency v elective surgery Low risk 1.6% risk of in-hospital post-op pulmonary complications (composite including respiratory failure, respiratory infection, pleural effusion, atelectasis, pneumothorax, bronchospasm, aspiration pneumonitis)    Martyn Ehrich, NP 09/18/2021

## 2021-09-18 NOTE — Assessment & Plan Note (Addendum)
-   Baseline dyspnea and dry cough - Encourage patient consistently use Stiolto Respimat 2 puff daily

## 2021-09-19 ENCOUNTER — Encounter (HOSPITAL_COMMUNITY): Admission: RE | Payer: Self-pay | Source: Home / Self Care

## 2021-09-19 ENCOUNTER — Ambulatory Visit (HOSPITAL_COMMUNITY)
Admission: RE | Admit: 2021-09-19 | Payer: No Typology Code available for payment source | Source: Home / Self Care | Admitting: Orthopedic Surgery

## 2021-09-19 SURGERY — ARTHROPLASTY, SHOULDER, TOTAL, REVERSE
Anesthesia: Choice | Site: Shoulder | Laterality: Left

## 2021-09-19 NOTE — Telephone Encounter (Signed)
OV notes and clearance form have been faxed over to [facility]. Nothing further needed at this time.

## 2021-09-22 ENCOUNTER — Other Ambulatory Visit: Payer: Self-pay

## 2021-09-22 ENCOUNTER — Ambulatory Visit (HOSPITAL_COMMUNITY)
Admission: RE | Admit: 2021-09-22 | Discharge: 2021-09-22 | Disposition: A | Discharge status: Home / Self Care | Payer: No Typology Code available for payment source | Source: Ambulatory Visit | Attending: Internal Medicine | Admitting: Internal Medicine

## 2021-09-22 ENCOUNTER — Inpatient Hospital Stay: Payer: No Typology Code available for payment source | Attending: Internal Medicine

## 2021-09-22 DIAGNOSIS — R911 Solitary pulmonary nodule: Secondary | ICD-10-CM | POA: Diagnosis not present

## 2021-09-22 DIAGNOSIS — Z79899 Other long term (current) drug therapy: Secondary | ICD-10-CM | POA: Insufficient documentation

## 2021-09-22 DIAGNOSIS — I10 Essential (primary) hypertension: Secondary | ICD-10-CM | POA: Insufficient documentation

## 2021-09-22 DIAGNOSIS — Z923 Personal history of irradiation: Secondary | ICD-10-CM | POA: Insufficient documentation

## 2021-09-22 DIAGNOSIS — I700 Atherosclerosis of aorta: Secondary | ICD-10-CM | POA: Diagnosis not present

## 2021-09-22 DIAGNOSIS — C3490 Malignant neoplasm of unspecified part of unspecified bronchus or lung: Secondary | ICD-10-CM | POA: Insufficient documentation

## 2021-09-22 DIAGNOSIS — Z7982 Long term (current) use of aspirin: Secondary | ICD-10-CM | POA: Insufficient documentation

## 2021-09-22 DIAGNOSIS — R918 Other nonspecific abnormal finding of lung field: Secondary | ICD-10-CM | POA: Diagnosis not present

## 2021-09-22 DIAGNOSIS — C3411 Malignant neoplasm of upper lobe, right bronchus or lung: Secondary | ICD-10-CM | POA: Insufficient documentation

## 2021-09-22 MED ORDER — IOHEXOL 300 MG/ML  SOLN
75.0000 mL | Freq: Once | INTRAMUSCULAR | Status: AC | PRN
  Administered 2021-09-22: 75 mL via INTRAVENOUS

## 2021-09-22 MED ORDER — SODIUM CHLORIDE (PF) 0.9 % IJ SOLN
INTRAMUSCULAR | Status: AC
  Filled 2021-09-22: qty 50

## 2021-09-24 ENCOUNTER — Ambulatory Visit: Payer: Medicare HMO | Admitting: Internal Medicine

## 2021-09-30 ENCOUNTER — Other Ambulatory Visit: Payer: Self-pay

## 2021-09-30 ENCOUNTER — Encounter: Payer: Self-pay | Admitting: Internal Medicine

## 2021-09-30 ENCOUNTER — Inpatient Hospital Stay (HOSPITAL_BASED_OUTPATIENT_CLINIC_OR_DEPARTMENT_OTHER): Payer: No Typology Code available for payment source | Admitting: Internal Medicine

## 2021-09-30 VITALS — BP 156/97 | HR 80 | Temp 97.6°F | Resp 18 | Ht 63.0 in | Wt 150.7 lb

## 2021-09-30 DIAGNOSIS — C3490 Malignant neoplasm of unspecified part of unspecified bronchus or lung: Secondary | ICD-10-CM

## 2021-09-30 DIAGNOSIS — C3491 Malignant neoplasm of unspecified part of right bronchus or lung: Secondary | ICD-10-CM

## 2021-09-30 DIAGNOSIS — Z7982 Long term (current) use of aspirin: Secondary | ICD-10-CM | POA: Diagnosis not present

## 2021-09-30 DIAGNOSIS — I10 Essential (primary) hypertension: Secondary | ICD-10-CM | POA: Diagnosis not present

## 2021-09-30 DIAGNOSIS — Z923 Personal history of irradiation: Secondary | ICD-10-CM | POA: Diagnosis not present

## 2021-09-30 DIAGNOSIS — C3411 Malignant neoplasm of upper lobe, right bronchus or lung: Secondary | ICD-10-CM | POA: Diagnosis not present

## 2021-09-30 DIAGNOSIS — Z79899 Other long term (current) drug therapy: Secondary | ICD-10-CM | POA: Diagnosis not present

## 2021-09-30 NOTE — Progress Notes (Signed)
[facility] [phone]   [phone]) By[place], MD [address] [place] [address]  DIAGNOSIS: Stage IA (T1b, N0, M0) non-small cell lung cancer, adenocarcinoma presented with right upper lobe lung nodule diagnosed in September 2022.  PRIOR THERAPY: Status post SBRT to the right upper lobe lung nodule under the care of Dr. Lisbeth Renshaw completed July 09, 2021.  CURRENT THERAPY: Observation.  INTERVAL HISTORY: Heather Ellis 76 y.o. female returns to the clinic today for follow-up visit.  The patient is feeling fine today with no concerning complaints except for shortness of breath with exertion.  She tolerated the previous course of SBRT to the right upper lobe lung nodule fairly well.  She denied having any chest pain, cough or hemoptysis.  She denied having any fever or chills.  She has no nausea, vomiting, diarrhea or constipation.  She has no headache or visual changes.  She had repeat CT scan of the chest performed recently and she is here for evaluation and discussion of her scan results.  MEDICAL HISTORY: Past Medical History:  Diagnosis Date   Acid reflux    Anemia    Asthma    Cancer ([place])    Right lung   COPD (chronic obstructive pulmonary disease) ([place])    H/O polymyalgia rheumatica    History of COVID-19 2020   Hypertension    OA (osteoarthritis)    Obesity    Osteoporosis    Pre-diabetes    Syncope    Syncope and collapse 08/2018   Vitamin D deficiency     ALLERGIES:  is allergic to fosamax [alendronate sodium].  MEDICATIONS:  Current Outpatient Medications  Medication Sig Dispense Refill   aspirin EC 81 MG tablet Take 1 tablet (81 mg total) by mouth 2 (two) times daily. For DVT prophylaxis for 30 days after surgery. (Patient taking differently: Take 81 mg by mouth daily.) 60 tablet 0   bisoprolol-hydrochlorothiazide (ZIAC) 5-6.25 MG tablet Take 2 tablets by mouth daily.      carboxymethylcellulose (REFRESH PLUS) 0.5 % SOLN 1 drop 3 (three) times daily as needed (dry eyes).     cholecalciferol (VITAMIN D) 1000 UNITS tablet Take 1,000 Units by mouth daily.     folic acid (FOLVITE) 1 MG tablet Take 1 mg by mouth daily.     lisinopril (ZESTRIL) 40 MG tablet Take 40 mg by mouth daily.     Menthol, Topical Analgesic, (BIOFREEZE EX) Apply 1 application topically daily as needed (knee pain).     methotrexate 2.5 MG tablet Take 20 mg by mouth once a week.     omeprazole (PRILOSEC) 20 MG capsule Take 20 mg by mouth daily as needed (acid reflux).     Tiotropium Bromide-Olodaterol (STIOLTO RESPIMAT) 2.5-2.5 MCG/ACT AERS Inhale 2 puffs into the lungs daily. (Patient taking differently: Inhale 2 puffs into the lungs daily as needed (shortness of breath).) 4 g 0   traMADol (ULTRAM) 50 MG tablet Take 1 tablet (50 mg total) by mouth every 8 (eight) hours as needed. 15 tablet 0   No current facility-administered medications for this visit.    SURGICAL HISTORY:  Past Surgical History:  Procedure Laterality Date   ABDOMINAL HYSTERECTOMY     TOTAL ABDOMINAL HYSTERECTOMY    BRONCHIAL BIOPSY  05/12/2021   Procedure: BRONCHIAL BIOPSIES;  Surgeon: Collene Gobble, MD;  Location: [facility] ENDOSCOPY;  Service: Pulmonary;;   BRONCHIAL BRUSHINGS  05/12/2021  Procedure: BRONCHIAL BRUSHINGS;  Surgeon: Collene Gobble, MD;  Location: [facility] ENDOSCOPY;  Service: Pulmonary;;   BRONCHIAL NEEDLE ASPIRATION BIOPSY  05/12/2021   Procedure: BRONCHIAL NEEDLE ASPIRATION BIOPSIES;  Surgeon: Collene Gobble, MD;  Location[facility] ENDOSCOPY;  Service: Pulmonary;;   COLONOSCOPY W/ BIOPSIES  2015   ESOPHAGOGASTRODUODENOSCOPY     FIDUCIAL MARKER PLACEMENT  05/12/2021   Procedure: FIDUCIAL MARKER PLACEMENT;  Surgeon: Collene Gobble, MD;  Location: [facility] ENDOSCOPY;  Service: Pulmonary;;   OOPHORECTOMY     BSO   TOTAL HIP ARTHROPLASTY Left 01/16/2020   Procedure: TOTAL HIP ARTHROPLASTY ANTERIOR APPROACH;  Surgeon: Renette Butters, MD;  Location: [facility] ORS;  Service: Orthopedics;  Laterality: Left;   VIDEO BRONCHOSCOPY WITH ENDOBRONCHIAL NAVIGATION Right 05/12/2021   Procedure: ROBOTIC ASSISTED BRONCHOSCOPY WITH ENDOBRONCHIAL NAVIGATION;  Surgeon: Collene Gobble, MD;  Location: [facility] ENDOSCOPY;  Service: Pulmonary;  Laterality: Right;   VIDEO BRONCHOSCOPY WITH RADIAL ENDOBRONCHIAL ULTRASOUND  05/12/2021   Procedure: VIDEO BRONCHOSCOPY WITH RADIAL ENDOBRONCHIAL ULTRASOUND;  Surgeon: Collene Gobble, MD;  Location: [facility] ENDOSCOPY;  Service: Pulmonary;;    REVIEW OF SYSTEMS:  A comprehensive review of systems was negative except for: Respiratory: positive for dyspnea on exertion   PHYSICAL EXAMINATION: General appearance: alert, cooperative, and no distress Head: Normocephalic, without obvious abnormality, atraumatic Neck: no adenopathy, no JVD, supple, symmetrical, trachea midline, and thyroid not enlarged, symmetric, no tenderness/mass/nodules Lymph nodes: Cervical, supraclavicular, and axillary nodes normal. Resp: clear to auscultation bilaterally Back: symmetric, no curvature. ROM normal. No CVA tenderness. Cardio: regular rate and rhythm, S1, S2 normal, no murmur, click, rub or gallop GI: soft, non-tender; bowel sounds normal; no masses,  no organomegaly Extremities: extremities normal, atraumatic, no cyanosis or edema  ECOG PERFORMANCE STATUS: 1 - Symptomatic but completely ambulatory  Blood pressure (!) 156/97, pulse 80, temperature 97.6 F (36.4 C), temperature source Tympanic, resp. rate 18, height 5\' 3"  (1.6 m), weight 150 lb 11.2 oz (68.4 kg), SpO2 100 %.  LABORATORY DATA: Lab Results  Component Value Date   WBC 3.9 (L) 09/12/2021   HGB 11.4 (L) 09/12/2021   HCT 36.1 09/12/2021   MCV 96.8 09/12/2021   PLT 238 09/12/2021      Chemistry      Component Value Date/Time   NA 138 09/12/2021 1209   NA 146 (H) 10/26/2018 0816   K 3.9 09/12/2021 1209   CL 100 09/12/2021 1209   CO2 30 09/12/2021 1209    BUN 11 09/12/2021 1209   BUN 17 10/26/2018 0816   CREATININE 0.50 09/12/2021 1209   CREATININE 0.70 05/29/2021 1348      Component Value Date/Time   CALCIUM 9.5 09/12/2021 1209   ALKPHOS 93 05/29/2021 1348   AST 14 (L) 05/29/2021 1348   ALT 6 05/29/2021 1348   BILITOT 0.6 05/29/2021 1348       RADIOGRAPHIC STUDIES: CT Chest W Contrast  Result Date: 09/23/2021 CLINICAL DATA:  76 year old female with history of non-small cell lung cancer diagnosed in August 2022 status post radiation therapy which is now complete. Follow-up study. EXAM: CT CHEST WITH CONTRAST TECHNIQUE: Multidetector CT imaging of the chest was performed during intravenous contrast administration. RADIATION DOSE REDUCTION: This exam was performed according to the departmental dose-optimization program which includes automated exposure control, adjustment of the mA and/or kV according to patient size and/or use of iterative reconstruction technique. CONTRAST:  72mL OMNIPAQUE IOHEXOL 300 MG/ML  SOLN COMPARISON:  Chest CT 03/28/2021.  PET-CT 04/17/2021. FINDINGS: Cardiovascular: Heart  size is normal. There is no significant pericardial fluid, thickening or pericardial calcification. Atherosclerotic calcifications in the thoracic aorta. No definite coronary artery calcifications. Ectasia of ascending thoracic aorta (4.2 cm in diameter). Calcifications of the mitral annulus. Mediastinum/Nodes: No pathologically enlarged mediastinal or hilar lymph nodes. Esophagus is unremarkable in appearance. No axillary lymphadenopathy. Lungs/Pleura: Fiducial marker in the right upper lobe adjacent to the treated right upper lobe pulmonary nodule. This treated nodule currently measures approximately 2.6 x 1.4 cm (axial image 49 of series 5) with increased surrounding septal thickening, ground-glass attenuation and regional architectural distortion in the adjacent right lung, most compatible with evolving postradiation changes. Scattered nodules are also  noted elsewhere in the lungs, largest of which is in the right lower lobe measuring 8 x 4 mm (mean diameter of 6 mm) on axial image 78 of series 5, previously only 5 mm on prior study 03/28/2021. No acute consolidative airspace disease. No pleural effusions. Upper Abdomen: Aortic atherosclerosis. Diffuse low attenuation throughout the visualized hepatic parenchyma, indicative of hepatic steatosis. Musculoskeletal: There are no aggressive appearing lytic or blastic lesions noted in the visualized portions of the skeleton. IMPRESSION: 1. Evolving postradiation changes around the treated right upper lobe pulmonary nodule, as discussed above. Today's study will serve as a new baseline for future follow-up examinations. No lymphadenopathy noted on today's examination. 2. Small pulmonary nodules scattered in the lungs bilaterally, nonspecific. Close attention on future follow-up studies is recommended to ensure stability or regression. 3. Aortic atherosclerosis with ectasia of ascending thoracic aorta (4.2 cm in diameter). Recommend annual imaging followup by CTA or MRA. This recommendation follows 2010 ACCF/AHA/AATS/ACR/ASA/SCA/SCAI/SIR/STS/SVM Guidelines for the Diagnosis and Management of Patients with Thoracic Aortic Disease. Circulation. 2010; 121: Z610-R604. Aortic aneurysm NOS (ICD10-I71.9). 4. Severe hepatic steatosis. Aortic Atherosclerosis (ICD10-I70.0). Electronically Signed   By: Vinnie Langton M.D.   On: 09/23/2021 08:16    ASSESSMENT AND PLAN: This is a very pleasant 76 years old African-American female diagnosed with a stage IA (T1b, N0, M0) non-small cell lung cancer, adenocarcinoma in September 2022 The patient is status post SBRT to the right upper lobe lung nodule completed in November 2022. She had repeat CT scan of the chest performed recently.  I personally and independently reviewed the scans and discussed the results with the patient today. Her scan showed no concerning findings for disease  recurrence or metastasis. I recommended for the patient to continue on observation with repeat CT scan of the chest in 6 months. She was advised to call immediately if she has any other concerning symptoms in the interval. The patient voices understanding of current disease status and treatment options and is in agreement with the current care plan.  All questions were answered. The patient knows to call the clinic with any problems, questions or concerns. We can certainly see the patient much sooner if necessary.  The total time spent in the appointment was 20 minutes.  Disclaimer: This note was dictated with voice recognition software. Similar sounding words can inadvertently be transcribed and may not be corrected upon review.

## 2021-10-03 NOTE — Progress Notes (Signed)
Sent message, via epic in basket, requesting orders in epic from surgeon.  

## 2021-10-06 NOTE — Progress Notes (Signed)
Covid test on 10/21/21.   Come thru main entrance at [facility] long.  Have a seat in the lobby on the right as you come thru the door.  Call [phone] and le tthem know you are here for covid testing.                Your procedure is scheduled on:   10/24/21.    Report to [facility] Main  Entrance   Report to admitting at   0700AM     Call this number if you have problems the morning of surgery [phone]    REMEMBER: NO  SOLID FOOD CANDY OR GUM AFTER MIDNIGHT. CLEAR LIQUIDS UNTIL   0645am         . NOTHING BY MOUTH EXCEPT CLEAR LIQUIDS UNTIL  0645am   . PLEASE FINISH ENSURE DRINK PER SURGEON ORDER  WHICH NEEDS TO BE COMPLETED AT  0645am     .      CLEAR LIQUID DIET   Foods Allowed                                                                    Coffee and tea, regular and decaf                            Fruit ices (not with fruit pulp)                                      Iced Popsicles                                    Carbonated beverages, regular and diet                                    Cranberry, grape and apple juices Sports drinks like Gatorade Lightly seasoned clear broth or consume(fat free) Sugar, honey syrup ___________________________________________________________________      BRUSH YOUR TEETH MORNING OF SURGERY AND RINSE YOUR MOUTH OUT, NO CHEWING GUM CANDY OR MINTS.     Take these medicines the morning of surgery with A SIP OF WATER: omeprazole, synthroid    DO NOT TAKE ANY DIABETIC MEDICATIONS DAY OF YOUR SURGERY                               You may not have any metal on your body including hair pins and              piercings  Do not wear jewelry, make-up, lotions, powders or perfumes, deodorant             Do not wear nail polish on your fingernails.  Do not shave  48 hours prior to surgery.              Men may shave face and neck.   Do not bring valuables to the hospital. [facility] IS NOT  RESPONSIBLE   FOR  VALUABLES.  Contacts, dentures or bridgework may not be worn into surgery.  Leave suitcase in the car. After surgery it may be brought to your room.     Patients discharged the day of surgery will not be allowed to drive home. IF YOU ARE HAVING SURGERY AND GOING HOME THE SAME DAY, YOU MUST HAVE AN ADULT TO DRIVE YOU HOME AND BE WITH YOU FOR 24 HOURS. YOU MAY GO HOME BY TAXI OR UBER OR ORTHERWISE, BUT AN ADULT MUST ACCOMPANY YOU HOME AND STAY WITH YOU FOR 24 HOURS.  Name and phone number of your driver:  Special Instructions: N/A              Please read over the following fact sheets you were given: _____________________________________________________________________  [facility] - Preparing for Surgery Before surgery, you can play an important role.  Because skin is not sterile, your skin needs to be as free of germs as possible.  You can reduce the number of germs on your skin by washing with CHG (chlorahexidine gluconate) soap before surgery.  CHG is an antiseptic cleaner which kills germs and bonds with the skin to continue killing germs even after washing. Please DO NOT use if you have an allergy to CHG or antibacterial soaps.  If your skin becomes reddened/irritated stop using the CHG and inform your nurse when you arrive at Short Stay. Do not shave (including legs and underarms) for at least 48 hours prior to the first CHG shower.  You may shave your face/neck. Please follow these instructions carefully:  1.  Shower with CHG Soap the night before surgery and the  morning of Surgery.  2.  If you choose to wash your hair, wash your hair first as usual with your  normal  shampoo.  3.  After you shampoo, rinse your hair and body thoroughly to remove the  shampoo.                           4.  Use CHG as you would any other liquid soap.  You can apply chg directly  to the skin and wash                       Gently with a scrungie or clean washcloth.  5.  Apply the CHG Soap to your body ONLY  FROM THE NECK DOWN.   Do not use on face/ open                           Wound or open sores. Avoid contact with eyes, ears mouth and genitals (private parts).                       Wash face,  Genitals (private parts) with your normal soap.             6.  Wash thoroughly, paying special attention to the area where your surgery  will be performed.  7.  Thoroughly rinse your body with warm water from the neck down.  8.  DO NOT shower/wash with your normal soap after using and rinsing off  the CHG Soap.                9.  Pat yourself dry with a clean towel.            10.  Wear clean pajamas.            11.  Place clean sheets on your bed the night of your first shower and do not  sleep with pets. Day of Surgery : Do not apply any lotions/deodorants the morning of surgery.  Please wear clean clothes to the hospital/surgery center.  FAILURE TO FOLLOW THESE INSTRUCTIONS MAY RESULT IN THE CANCELLATION OF YOUR SURGERY PATIENT SIGNATURE_________________________________  NURSE SIGNATURE__________________________________  ________________________________________________________________________

## 2021-10-06 NOTE — Progress Notes (Addendum)
Anesthesia Review:  PCP: DR [name]  Cardiologist :  NONE  Pulm- LOV 09/18/21- Heather walsh,NP  Chest x-ray : 05/12/21- 1v  CT chest- 09/23/21  EKG :03/28/21  PFT- 02/21/21  Echo : 09/23/18  Stress test: Cardiac Cath :  Activity level: cannot do a flgiht of stairs without diffficiulty  Sleep Study/ CPAP : none  Fasting Blood Sugar :      / Checks Blood Sugar -- times a day:   Blood Thinner/ Instructions /Last Dose: ASA / Instructions/ Last Dose :   Covid test on 10/21/21.  AT 9379KW Hgba1c-09/12/21- 5.4  81 mg aspirin  GLUCOSE WAS 79 AT PREOP.  PT ASYMPTOMATIC.  PT DID NOT EAT PRIOR TO PREOP APPT.  PT IGVEN 6 PACK OF PEANUT BUTTER CRACKERS AND APPLE JUICE.   Upon recheck glucose was 88.  PT  dismissed from preop .  PT states she is [place] [employer] to eat breakfast once leaves appt.   Prediabetes per pt does not check glucose at home

## 2021-10-06 NOTE — Progress Notes (Signed)
[facility]- Preparing for Total Shoulder Arthroplasty    Before surgery, you can play an important role. Because skin is not sterile, your skin needs to be as free of germs as possible. You can reduce the number of germs on your skin by using the following products. Benzoyl Peroxide Gel Reduces the number of germs present on the skin Applied twice a day to shoulder area starting two days before surgery    ==================================================================  Please follow these instructions carefully:  BENZOYL PEROXIDE 5% GEL  Please do not use if you have an allergy to benzoyl peroxide.   If your skin becomes reddened/irritated stop using the benzoyl peroxide.  Starting two days before surgery, apply as follows: Apply benzoyl peroxide in the morning and at night. Apply after taking a shower. If you are not taking a shower clean entire shoulder front, back, and side along with the armpit with a clean wet washcloth.  Place a quarter-sized dollop on your shoulder and rub in thoroughly, making sure to cover the front, back, and side of your shoulder, along with the armpit.   2 days before ____ AM   ____ PM              1 day before ____ AM   ____ PM                         Do this twice a day for two days.  (Last application is the night before surgery, AFTER using the CHG soap as described below).  Do NOT apply benzoyl peroxide gel on the day of surgery.

## 2021-10-08 ENCOUNTER — Ambulatory Visit
Admission: RE | Admit: 2021-10-08 | Discharge: 2021-10-08 | Disposition: A | Discharge status: Home / Self Care | Payer: No Typology Code available for payment source | Source: Ambulatory Visit | Attending: Internal Medicine | Admitting: Internal Medicine

## 2021-10-08 DIAGNOSIS — Z1231 Encounter for screening mammogram for malignant neoplasm of breast: Secondary | ICD-10-CM | POA: Diagnosis not present

## 2021-10-10 ENCOUNTER — Encounter (HOSPITAL_COMMUNITY): Payer: Self-pay

## 2021-10-10 ENCOUNTER — Other Ambulatory Visit: Payer: Self-pay

## 2021-10-10 ENCOUNTER — Encounter (HOSPITAL_COMMUNITY)
Admission: RE | Admit: 2021-10-10 | Discharge: 2021-10-10 | Disposition: A | Discharge status: Home / Self Care | Payer: No Typology Code available for payment source | Source: Ambulatory Visit | Attending: Orthopedic Surgery | Admitting: Orthopedic Surgery

## 2021-10-10 VITALS — BP 149/96 | HR 66 | Temp 98.2°F | Resp 16 | Ht 63.0 in | Wt 150.6 lb

## 2021-10-10 DIAGNOSIS — Z01818 Encounter for other preprocedural examination: Secondary | ICD-10-CM

## 2021-10-10 DIAGNOSIS — Z01812 Encounter for preprocedural laboratory examination: Secondary | ICD-10-CM | POA: Diagnosis not present

## 2021-10-10 HISTORY — DX: Dyspnea, unspecified: R06.00

## 2021-10-10 LAB — BASIC METABOLIC PANEL
Anion gap: 5 (ref 5–15)
BUN: 16 mg/dL (ref 8–23)
CO2: 30 mmol/L (ref 22–32)
Calcium: 9.4 mg/dL (ref 8.9–10.3)
Chloride: 103 mmol/L (ref 98–111)
Creatinine, Ser: 0.71 mg/dL (ref 0.44–1.00)
GFR, Estimated: >60 mL/min (ref >60)
Glucose, Bld: 98 mg/dL (ref 70–99)
Potassium: 4.1 mmol/L (ref 3.5–5.1)
Sodium: 138 mmol/L (ref 135–145)

## 2021-10-10 LAB — CBC
HCT: 36.8 % (ref 36.0–46.0)
Hemoglobin: 11.3 g/dL — ABNORMAL LOW (ref 12.0–15.0)
MCH: 29.8 pg (ref 26.0–34.0)
MCHC: 30.7 g/dL (ref 30.0–36.0)
MCV: 97.1 fL (ref 80.0–100.0)
Platelets: 216 10*3/uL (ref 150–400)
RBC: 3.79 MIL/uL — ABNORMAL LOW (ref 3.87–5.11)
RDW: 13.7 % (ref 11.5–15.5)
WBC: 4.1 10*3/uL (ref 4.0–10.5)
nRBC: 0 % (ref 0.0–0.2)

## 2021-10-10 LAB — SURGICAL PCR SCREEN
MRSA, PCR: NEGATIVE
Staphylococcus aureus: POSITIVE — AB

## 2021-10-10 LAB — GLUCOSE, CAPILLARY
Glucose-Capillary: 79 mg/dL (ref 70–99)
Glucose-Capillary: 88 mg/dL (ref 70–99)

## 2021-10-14 NOTE — Progress Notes (Signed)
Labs required preoperatively to assess surgical risk.

## 2021-10-15 NOTE — Progress Notes (Signed)
Anesthesia Chart Review   Case: [id number] Date/Time: 10/24/21 0932   Procedure: REVERSE SHOULDER ARTHROPLASTY (Left: Shoulder)   Anesthesia type: Choice   Pre-op diagnosis: Left shoulder rotator cuff arthropathy   Location: [name] ROOM 08 / [facility]S   Surgeons: Nicholes Stairs, MD       DISCUSSION:75 y.o. former smoker with h/o HTN, asthma, COPD, adenocarcinoma RUL s/p radiotherapy, polymyalgia rheumatica (prednisone 10 mg), left shoulder rotator cuff arthropathy scheduled for above procedure 10/24/2021 with Dr. [name]  Pt last seen by pulmonology 09/18/2021. Pt has mild exertional dyspnea and dry cough at baseline.  Per OV note, "- Plan left rotator cuff surgery on February 24th, 2023 with Dr. Stann Mainland at [facility]. Patient is considered high risk for prolonged mechanical ventilation and/or post-op pulmonary complications based SEVERE COPD (FEV1 36%) and adenocarcinoma. Her exam was benign today, lungs were clear to ascultation. She has no acute respiratory symptoms or recent exacerbations. O2 remained > 95-96% RA on ambulation. Ultimate clearance will be decided by surgeon/anesthesiologist.    Major Pulmonary risks identified in the multifactorial risk analysis are but not limited to a) pneumonia; b) recurrent intubation risk; c) prolonged or recurrent acute respiratory failure needing mechanical ventilation; d) prolonged hospitalization; e) DVT/Pulmonary embolism; f) Acute Pulmonary edema   Recommend 1. Short duration of surgery as much as possible and avoid paralytic if possible 2. Recovery in step down or ICU with Pulmonary consultation 3. DVT prophylaxis 4. Aggressive pulmonary toilet with o2, bronchodilatation, and incentive spirometry and early ambulation"  Anticipate pt can proceed with planned procedure barring acute status change and after evaluation DOS.  VS: BP (!) 149/96    Pulse 66    Temp 36.8 C (Oral)    Resp 16    Ht 5\' 3"  (1.6 m)    Wt 68.3 kg    SpO2 99%     BMI 26.67 kg/m   PROVIDERS: [name], MD is PCP   Baltazar Apo, MD is Cardiologist  LABS: Labs reviewed: Acceptable for surgery. (all labs ordered are listed, but only abnormal results are displayed)  Labs Reviewed  SURGICAL PCR SCREEN - Abnormal; Notable for the following components:      Result Value   Staphylococcus aureus POSITIVE (*)    All other components within normal limits  CBC - Abnormal; Notable for the following components:   RBC 3.79 (*)    Hemoglobin 11.3 (*)    All other components within normal limits  BASIC METABOLIC PANEL  GLUCOSE, CAPILLARY  GLUCOSE, CAPILLARY     IMAGES:   EKG: 03/28/2021 Rate 77 bpm  Sinus rhythm  Atrial premature complex  Consider left ventricular hypertrophy  CV: Echo 09/23/2018  - Left ventricle: The cavity size was normal. Wall thickness was    normal. Systolic function was normal. The estimated ejection    fraction was in the range of 55% to 60%. Wall motion was normal;    there were no regional wall motion abnormalities. Doppler    parameters are consistent with abnormal left ventricular    relaxation (grade 1 diastolic dysfunction). Doppler parameters    are consistent with high ventricular filling pressure.  - Aortic valve: There was trivial regurgitation.  - Mitral valve: Calcified annulus. Prolapse, involving the    posterior leaflet. There was mild to moderate regurgitation.  - Pulmonary arteries: PA peak pressure: 31 mm Hg (S).  Past Medical History:  Diagnosis Date   Acid reflux    Anemia  Asthma    Cancer ([place])    Right lung   COPD (chronic obstructive pulmonary disease) (HCC)    Dyspnea    WITH EXERTION   H/O polymyalgia rheumatica    History of COVID-19 2020   Hypertension    OA (osteoarthritis)    Obesity    Osteoporosis    Pre-diabetes    Syncope    Syncope and collapse 08/2018   Vitamin D deficiency     Past Surgical History:  Procedure Laterality Date   ABDOMINAL HYSTERECTOMY      TOTAL ABDOMINAL HYSTERECTOMY    BRONCHIAL BIOPSY  05/12/2021   Procedure: BRONCHIAL BIOPSIES;  Surgeon: Collene Gobble, MD;  Location: [place];  Service: Pulmonary;;   BRONCHIAL BRUSHINGS  05/12/2021   Procedure: BRONCHIAL BRUSHINGS;  Surgeon: Collene Gobble, MD;  Location: [place];  Service: Pulmonary;;   BRONCHIAL NEEDLE ASPIRATION BIOPSY  05/12/2021   Procedure: BRONCHIAL NEEDLE ASPIRATION BIOPSIES;  Surgeon: Collene Gobble, MD;  Location: [place];  Service: Pulmonary;;   COLONOSCOPY W/ BIOPSIES  2015   ESOPHAGOGASTRODUODENOSCOPY     FIDUCIAL MARKER PLACEMENT  05/12/2021   Procedure: FIDUCIAL MARKER PLACEMENT;  Surgeon: Collene Gobble, MD;  Location: [facility] ENDOSCOPY;  Service: Pulmonary;;   OOPHORECTOMY     BSO   TOTAL HIP ARTHROPLASTY Left 01/16/2020   Procedure: TOTAL HIP ARTHROPLASTY ANTERIOR APPROACH;  Surgeon: Renette Butters, MD;  Location: [facility];  Service: Orthopedics;  Laterality: Left;   VIDEO BRONCHOSCOPY WITH ENDOBRONCHIAL NAVIGATION Right 05/12/2021   Procedure: ROBOTIC ASSISTED BRONCHOSCOPY WITH ENDOBRONCHIAL NAVIGATION;  Surgeon: Collene Gobble, MD;  Location: [place] [facility];  Service: Pulmonary;  Laterality: Right;   VIDEO BRONCHOSCOPY WITH RADIAL ENDOBRONCHIAL ULTRASOUND  05/12/2021   Procedure: VIDEO BRONCHOSCOPY WITH RADIAL ENDOBRONCHIAL ULTRASOUND;  Surgeon: Collene Gobble, MD;  Location: [facility] END[facility];  Service: Pulmonary;;    MEDICATIONS:  aspirin EC 81 MG tablet   bisoprolol-hydrochlorothiazide (ZIAC) 5-6.25 MG tablet   carboxymethylcellulose (REFRESH PLUS) 0.5 % SOLN   cholecalciferol (VITAMIN D) 1000 UNITS tablet   folic acid (FOLVITE) 1 MG tablet   lisinopril (ZESTRIL) 40 MG tablet   Menthol, Topical Analgesic, (BIOFREEZE EX)   methotrexate 2.5 MG tablet   omeprazole (PRILOSEC) 20 MG capsule   Tiotropium Bromide-Olodaterol (STIOLTO RESPIMAT) 2.5-2.5 MCG/ACT AERS   traMADol (ULTRAM) 50 MG tablet   No current facility-administered medications for  this encounter.     Heather Felix Ward, PA-C [facility] Pre-Surgical Testing [phone]

## 2021-10-15 NOTE — Anesthesia Preprocedure Evaluation (Addendum)
Anesthesia Evaluation  Patient identified by MRN, date of birth, ID band Patient awake    Reviewed: Allergy & Precautions, NPO status , Patient's Chart, lab work & pertinent test results, reviewed documented beta blocker date and time   Airway Mallampati: II  TM Distance: >3 FB Neck ROM: Full    Dental no notable dental hx. (+) Dental Advisory Given   Pulmonary shortness of breath, asthma , COPD, former smoker,    Pulmonary exam normal breath sounds clear to auscultation       Cardiovascular hypertension, Pt. on home beta blockers and Pt. on medications Normal cardiovascular exam Rhythm:Regular Rate:Normal     Neuro/Psych  Neuromuscular disease    GI/Hepatic Neg liver ROS, GERD  ,  Endo/Other  negative endocrine ROS  Renal/GU Renal disease     Musculoskeletal  (+) Arthritis ,   Abdominal   Peds  Hematology  (+) Blood dyscrasia, anemia ,   Anesthesia Other Findings   Reproductive/Obstetrics                                                           Anesthesia Evaluation  Patient identified by MRN, date of birth, ID band Patient awake    Reviewed: Allergy & Precautions, NPO status , Patient's Chart, lab work & pertinent test results  Airway Mallampati: II  TM Distance: >3 FB Neck ROM: Full    Dental  (+) Dental Advisory Given   Pulmonary asthma , COPD, former smoker,    breath sounds clear to auscultation       Cardiovascular hypertension, Pt. on medications and Pt. on home beta blockers  Rhythm:Regular Rate:Normal     Neuro/Psych negative neurological ROS     GI/Hepatic Neg liver ROS, GERD  ,  Endo/Other  negative endocrine ROS  Renal/GU Renal disease     Musculoskeletal  (+) Arthritis ,   Abdominal   Peds  Hematology negative hematology ROS (+)   Anesthesia Other Findings   Reproductive/Obstetrics                              Anesthesia Physical Anesthesia Plan  ASA: 3  Anesthesia Plan: General   Post-op Pain Management:    Induction: Intravenous  PONV Risk Score and Plan: 3 and Dexamethasone, Ondansetron, Treatment may vary due to age or medical condition and Midazolam  Airway Management Planned: Oral ETT  Additional Equipment: None  Intra-op Plan:   Post-operative Plan: Extubation in OR  Informed Consent: I have reviewed the patients History and Physical, chart, labs and discussed the procedure including the risks, benefits and alternatives for the proposed anesthesia with the patient or authorized representative who has indicated his/her understanding and acceptance.     Dental advisory given  Plan Discussed with: CRNA  Anesthesia Plan Comments:         Anesthesia Quick Evaluation  Anesthesia Physical Anesthesia Plan  ASA: 2  Anesthesia Plan: General   Post-op Pain Management: Tylenol PO (pre-op)* and Regional block*   Induction: Intravenous  PONV Risk Score and Plan: 3 and Dexamethasone, Ondansetron and Treatment may vary due to age or medical condition  Airway Management Planned: Oral ETT  Additional Equipment:   Intra-op Plan:   Post-operative Plan: Extubation in OR  Informed Consent:  I have reviewed the patients History and Physical, chart, labs and discussed the procedure including the risks, benefits and alternatives for the proposed anesthesia with the patient or authorized representative who has indicated his/her understanding and acceptance.     Dental advisory given  Plan Discussed with: CRNA  Anesthesia Plan Comments: (See PAT note 10/10/21)      Anesthesia Quick Evaluation

## 2021-10-21 ENCOUNTER — Other Ambulatory Visit: Payer: Self-pay

## 2021-10-21 ENCOUNTER — Encounter (HOSPITAL_COMMUNITY)
Admission: RE | Admit: 2021-10-21 | Discharge: 2021-10-21 | Disposition: A | Discharge status: Home / Self Care | Payer: No Typology Code available for payment source | Source: Ambulatory Visit | Attending: Orthopedic Surgery | Admitting: Orthopedic Surgery

## 2021-10-21 DIAGNOSIS — Z20822 Contact with and (suspected) exposure to covid-19: Secondary | ICD-10-CM | POA: Insufficient documentation

## 2021-10-21 DIAGNOSIS — Z01812 Encounter for preprocedural laboratory examination: Secondary | ICD-10-CM | POA: Diagnosis not present

## 2021-10-21 DIAGNOSIS — Z01818 Encounter for other preprocedural examination: Secondary | ICD-10-CM

## 2021-10-21 LAB — SARS CORONAVIRUS 2 (TAT 6-24 HRS): SARS Coronavirus 2: NEGATIVE

## 2021-10-24 ENCOUNTER — Ambulatory Visit (HOSPITAL_COMMUNITY): Payer: No Typology Code available for payment source | Admitting: Physician Assistant

## 2021-10-24 ENCOUNTER — Ambulatory Visit (HOSPITAL_BASED_OUTPATIENT_CLINIC_OR_DEPARTMENT_OTHER): Payer: No Typology Code available for payment source | Admitting: Certified Registered Nurse Anesthetist

## 2021-10-24 ENCOUNTER — Other Ambulatory Visit: Payer: Self-pay

## 2021-10-24 ENCOUNTER — Observation Stay (HOSPITAL_COMMUNITY)
Admission: RE | Admit: 2021-10-24 | Discharge: 2021-10-26 | Disposition: A | Discharge status: Home / Self Care | Payer: No Typology Code available for payment source | Source: Ambulatory Visit | Attending: Orthopedic Surgery | Admitting: Orthopedic Surgery

## 2021-10-24 ENCOUNTER — Encounter (HOSPITAL_COMMUNITY)
Admission: RE | Disposition: A | Discharge status: Home / Self Care | Payer: Self-pay | Source: Ambulatory Visit | Attending: Orthopedic Surgery

## 2021-10-24 ENCOUNTER — Encounter (HOSPITAL_COMMUNITY): Payer: Self-pay | Admitting: Orthopedic Surgery

## 2021-10-24 ENCOUNTER — Observation Stay (HOSPITAL_COMMUNITY): Payer: No Typology Code available for payment source

## 2021-10-24 DIAGNOSIS — M75102 Unspecified rotator cuff tear or rupture of left shoulder, not specified as traumatic: Principal | ICD-10-CM | POA: Insufficient documentation

## 2021-10-24 DIAGNOSIS — I10 Essential (primary) hypertension: Secondary | ICD-10-CM | POA: Diagnosis not present

## 2021-10-24 DIAGNOSIS — Z85118 Personal history of other malignant neoplasm of bronchus and lung: Secondary | ICD-10-CM | POA: Insufficient documentation

## 2021-10-24 DIAGNOSIS — R7303 Prediabetes: Secondary | ICD-10-CM | POA: Diagnosis not present

## 2021-10-24 DIAGNOSIS — Z8616 Personal history of COVID-19: Secondary | ICD-10-CM | POA: Diagnosis not present

## 2021-10-24 DIAGNOSIS — J449 Chronic obstructive pulmonary disease, unspecified: Secondary | ICD-10-CM | POA: Insufficient documentation

## 2021-10-24 DIAGNOSIS — M12812 Other specific arthropathies, not elsewhere classified, left shoulder: Secondary | ICD-10-CM | POA: Diagnosis not present

## 2021-10-24 DIAGNOSIS — Z96612 Presence of left artificial shoulder joint: Secondary | ICD-10-CM

## 2021-10-24 DIAGNOSIS — Z87891 Personal history of nicotine dependence: Secondary | ICD-10-CM | POA: Diagnosis not present

## 2021-10-24 DIAGNOSIS — Z01812 Encounter for preprocedural laboratory examination: Secondary | ICD-10-CM

## 2021-10-24 DIAGNOSIS — Z471 Aftercare following joint replacement surgery: Secondary | ICD-10-CM | POA: Diagnosis not present

## 2021-10-24 DIAGNOSIS — G8918 Other acute postprocedural pain: Secondary | ICD-10-CM | POA: Diagnosis not present

## 2021-10-24 DIAGNOSIS — M75122 Complete rotator cuff tear or rupture of left shoulder, not specified as traumatic: Secondary | ICD-10-CM | POA: Diagnosis not present

## 2021-10-24 DIAGNOSIS — J45909 Unspecified asthma, uncomplicated: Secondary | ICD-10-CM | POA: Insufficient documentation

## 2021-10-24 DIAGNOSIS — Z96642 Presence of left artificial hip joint: Secondary | ICD-10-CM | POA: Insufficient documentation

## 2021-10-24 HISTORY — PX: REVERSE SHOULDER ARTHROPLASTY: SHX5054

## 2021-10-24 LAB — GLUCOSE, CAPILLARY
Glucose-Capillary: 83 mg/dL (ref 70–99)
Glucose-Capillary: 112 mg/dL — ABNORMAL HIGH (ref 70–99)

## 2021-10-24 SURGERY — REVERSE SHOULDER ARTHROPLASTY
Anesthesia: General | Site: Shoulder | Laterality: Left

## 2021-10-24 MED ORDER — METOCLOPRAMIDE HCL 5 MG PO TABS: 5.0000 mg | ORAL_TABLET | Freq: Three times a day (TID) | ORAL | Status: DC | PRN

## 2021-10-24 MED ORDER — ACETAMINOPHEN 325 MG PO TABS: 325.0000 mg | ORAL_TABLET | Freq: Four times a day (QID) | ORAL | Status: DC | PRN

## 2021-10-24 MED ORDER — VITAMIN D 25 MCG (1000 UNIT) PO TABS
1000.0000 [IU] | ORAL_TABLET | Freq: Every day | ORAL | Status: DC
  Administered 2021-10-24 – 2021-10-25 (×2): 1000 [IU] via ORAL
  Filled 2021-10-24 (×2): qty 1

## 2021-10-24 MED ORDER — PHENYLEPHRINE 40 MCG/ML (10ML) SYRINGE FOR IV PUSH (FOR BLOOD PRESSURE SUPPORT)
PREFILLED_SYRINGE | INTRAVENOUS | Status: DC | PRN
  Administered 2021-10-24: 120 ug via INTRAVENOUS
  Administered 2021-10-24: 80 ug via INTRAVENOUS

## 2021-10-24 MED ORDER — VANCOMYCIN HCL 1000 MG IV SOLR
INTRAVENOUS | Status: DC | PRN
  Administered 2021-10-24: 1000 mg via TOPICAL

## 2021-10-24 MED ORDER — FENTANYL CITRATE PF 50 MCG/ML IJ SOSY
25.0000 ug | PREFILLED_SYRINGE | INTRAMUSCULAR | Status: DC | PRN
  Administered 2021-10-24 (×2): 50 ug via INTRAVENOUS

## 2021-10-24 MED ORDER — PHENYLEPHRINE 40 MCG/ML (10ML) SYRINGE FOR IV PUSH (FOR BLOOD PRESSURE SUPPORT)
PREFILLED_SYRINGE | INTRAVENOUS | Status: AC
  Filled 2021-10-24: qty 10

## 2021-10-24 MED ORDER — AMISULPRIDE (ANTIEMETIC) 5 MG/2ML IV SOLN
10.0000 mg | Freq: Once | INTRAVENOUS | Status: AC | PRN
  Administered 2021-10-24: 10 mg via INTRAVENOUS

## 2021-10-24 MED ORDER — SUGAMMADEX SODIUM 200 MG/2ML IV SOLN
INTRAVENOUS | Status: DC | PRN
  Administered 2021-10-24: 200 mg via INTRAVENOUS

## 2021-10-24 MED ORDER — LISINOPRIL 20 MG PO TABS
40.0000 mg | ORAL_TABLET | Freq: Every day | ORAL | Status: DC
  Administered 2021-10-24 – 2021-10-26 (×3): 40 mg via ORAL
  Filled 2021-10-24 (×3): qty 2

## 2021-10-24 MED ORDER — FOLIC ACID 1 MG PO TABS
1.0000 mg | ORAL_TABLET | Freq: Every day | ORAL | Status: DC
  Administered 2021-10-24 – 2021-10-26 (×3): 1 mg via ORAL
  Filled 2021-10-24 (×3): qty 1

## 2021-10-24 MED ORDER — DOCUSATE SODIUM 100 MG PO CAPS
100.0000 mg | ORAL_CAPSULE | Freq: Two times a day (BID) | ORAL | Status: DC
  Administered 2021-10-24 – 2021-10-26 (×3): 100 mg via ORAL
  Filled 2021-10-24 (×3): qty 1

## 2021-10-24 MED ORDER — FENTANYL CITRATE PF 50 MCG/ML IJ SOSY
50.0000 ug | PREFILLED_SYRINGE | Freq: Once | INTRAMUSCULAR | Status: AC
  Administered 2021-10-24: 50 ug via INTRAVENOUS
  Filled 2021-10-24: qty 2

## 2021-10-24 MED ORDER — OXYCODONE HCL 5 MG PO TABS: 5.0000 mg | ORAL_TABLET | Freq: Once | ORAL | Status: DC | PRN

## 2021-10-24 MED ORDER — MENTHOL 3 MG MT LOZG: 1.0000 | LOZENGE | OROMUCOSAL | Status: DC | PRN

## 2021-10-24 MED ORDER — PHENYLEPHRINE HCL-NACL 20-0.9 MG/250ML-% IV SOLN
INTRAVENOUS | Status: AC
  Filled 2021-10-24: qty 500

## 2021-10-24 MED ORDER — ONDANSETRON HCL 4 MG PO TABS: 4.0000 mg | ORAL_TABLET | Freq: Every day | ORAL | 1 refills | Status: DC | PRN

## 2021-10-24 MED ORDER — ACETAMINOPHEN 500 MG PO TABS
500.0000 mg | ORAL_TABLET | Freq: Four times a day (QID) | ORAL | Status: AC
  Administered 2021-10-24: 500 mg via ORAL
  Filled 2021-10-24 (×2): qty 1

## 2021-10-24 MED ORDER — AMISULPRIDE (ANTIEMETIC) 5 MG/2ML IV SOLN
INTRAVENOUS | Status: AC
  Filled 2021-10-24: qty 4

## 2021-10-24 MED ORDER — ONDANSETRON HCL 4 MG/2ML IJ SOLN
INTRAMUSCULAR | Status: AC
  Filled 2021-10-24: qty 2

## 2021-10-24 MED ORDER — FENTANYL CITRATE (PF) 100 MCG/2ML IJ SOLN
INTRAMUSCULAR | Status: AC
  Filled 2021-10-24: qty 2

## 2021-10-24 MED ORDER — PHENOL 1.4 % MT LIQD: 1.0000 | OROMUCOSAL | Status: DC | PRN

## 2021-10-24 MED ORDER — CLONIDINE HCL (ANALGESIA) 100 MCG/ML EP SOLN
EPIDURAL | Status: DC | PRN
  Administered 2021-10-24: 30 ug

## 2021-10-24 MED ORDER — LIDOCAINE HCL (PF) 2 % IJ SOLN
INTRAMUSCULAR | Status: AC
  Filled 2021-10-24: qty 5

## 2021-10-24 MED ORDER — LACTATED RINGERS IV SOLN: INTRAVENOUS | Status: DC

## 2021-10-24 MED ORDER — ROCURONIUM BROMIDE 10 MG/ML (PF) SYRINGE
PREFILLED_SYRINGE | INTRAVENOUS | Status: AC
  Filled 2021-10-24: qty 10

## 2021-10-24 MED ORDER — PANTOPRAZOLE SODIUM 40 MG PO TBEC
40.0000 mg | DELAYED_RELEASE_TABLET | Freq: Every day | ORAL | Status: DC
  Administered 2021-10-24 – 2021-10-26 (×3): 40 mg via ORAL
  Filled 2021-10-24 (×3): qty 1

## 2021-10-24 MED ORDER — DEXAMETHASONE SODIUM PHOSPHATE 4 MG/ML IJ SOLN
INTRAMUSCULAR | Status: DC | PRN
  Administered 2021-10-24: 2 mg via PERINEURAL

## 2021-10-24 MED ORDER — PREDNISONE 5 MG PO TABS
5.0000 mg | ORAL_TABLET | ORAL | Status: DC
Start: 2021-10-25 — End: 2021-10-26
  Administered 2021-10-25: 5 mg via ORAL
  Filled 2021-10-24: qty 1

## 2021-10-24 MED ORDER — BUPIVACAINE-EPINEPHRINE (PF) 0.25% -1:200000 IJ SOLN
INTRAMUSCULAR | Status: AC
  Filled 2021-10-24: qty 30

## 2021-10-24 MED ORDER — PHENYLEPHRINE HCL-NACL 20-0.9 MG/250ML-% IV SOLN
INTRAVENOUS | Status: DC | PRN
  Administered 2021-10-24: 20 ug/min via INTRAVENOUS

## 2021-10-24 MED ORDER — ASPIRIN EC 325 MG PO TBEC: 325.0000 mg | DELAYED_RELEASE_TABLET | Freq: Every day | ORAL | Status: DC

## 2021-10-24 MED ORDER — ALBUTEROL SULFATE HFA 108 (90 BASE) MCG/ACT IN AERS
INHALATION_SPRAY | RESPIRATORY_TRACT | Status: DC | PRN
  Administered 2021-10-24: 4 via RESPIRATORY_TRACT

## 2021-10-24 MED ORDER — HYDROCODONE-ACETAMINOPHEN 5-325 MG PO TABS
1.0000 | ORAL_TABLET | ORAL | Status: DC | PRN
  Administered 2021-10-24 (×2): 2 via ORAL
  Administered 2021-10-25 (×3): 1 via ORAL
  Filled 2021-10-24: qty 2
  Filled 2021-10-24: qty 1
  Filled 2021-10-24: qty 2
  Filled 2021-10-24 (×2): qty 1

## 2021-10-24 MED ORDER — ORAL CARE MOUTH RINSE: 15.0000 mL | Freq: Once | OROMUCOSAL | Status: AC

## 2021-10-24 MED ORDER — TRANEXAMIC ACID-NACL 1000-0.7 MG/100ML-% IV SOLN
1000.0000 mg | Freq: Once | INTRAVENOUS | Status: AC
  Administered 2021-10-24: 1000 mg via INTRAVENOUS
  Filled 2021-10-24: qty 100

## 2021-10-24 MED ORDER — PROPOFOL 10 MG/ML IV BOLUS
INTRAVENOUS | Status: AC
  Filled 2021-10-24: qty 20

## 2021-10-24 MED ORDER — VANCOMYCIN HCL 1000 MG IV SOLR
INTRAVENOUS | Status: AC
  Filled 2021-10-24: qty 20

## 2021-10-24 MED ORDER — HYDROCODONE-ACETAMINOPHEN 7.5-325 MG PO TABS: 1.0000 | ORAL_TABLET | ORAL | Status: DC | PRN

## 2021-10-24 MED ORDER — ACETAMINOPHEN 500 MG PO TABS
1000.0000 mg | ORAL_TABLET | Freq: Once | ORAL | Status: AC
  Administered 2021-10-24: 1000 mg via ORAL
  Filled 2021-10-24: qty 2

## 2021-10-24 MED ORDER — BISOPROLOL-HYDROCHLOROTHIAZIDE 5-6.25 MG PO TABS
2.0000 | ORAL_TABLET | Freq: Every morning | ORAL | Status: DC
  Administered 2021-10-25 – 2021-10-26 (×2): 2 via ORAL
  Filled 2021-10-24 (×2): qty 2

## 2021-10-24 MED ORDER — TRANEXAMIC ACID-NACL 1000-0.7 MG/100ML-% IV SOLN
1000.0000 mg | INTRAVENOUS | Status: AC
  Administered 2021-10-24: 1000 mg via INTRAVENOUS
  Filled 2021-10-24: qty 100

## 2021-10-24 MED ORDER — LIDOCAINE 2% (20 MG/ML) 5 ML SYRINGE
INTRAMUSCULAR | Status: DC | PRN
  Administered 2021-10-24: 40 mg via INTRAVENOUS

## 2021-10-24 MED ORDER — DEXAMETHASONE SODIUM PHOSPHATE 10 MG/ML IJ SOLN
INTRAMUSCULAR | Status: AC
  Filled 2021-10-24: qty 1

## 2021-10-24 MED ORDER — ONDANSETRON HCL 4 MG/2ML IJ SOLN
INTRAMUSCULAR | Status: DC | PRN
  Administered 2021-10-24: 4 mg via INTRAVENOUS

## 2021-10-24 MED ORDER — CEFAZOLIN SODIUM-DEXTROSE 1-4 GM/50ML-% IV SOLN
1.0000 g | Freq: Four times a day (QID) | INTRAVENOUS | Status: AC
  Administered 2021-10-24 – 2021-10-25 (×3): 1 g via INTRAVENOUS
  Filled 2021-10-24 (×3): qty 50

## 2021-10-24 MED ORDER — FENTANYL CITRATE PF 50 MCG/ML IJ SOSY
PREFILLED_SYRINGE | INTRAMUSCULAR | Status: AC
  Filled 2021-10-24: qty 3

## 2021-10-24 MED ORDER — METOCLOPRAMIDE HCL 5 MG/ML IJ SOLN: 5.0000 mg | Freq: Three times a day (TID) | INTRAMUSCULAR | Status: DC | PRN

## 2021-10-24 MED ORDER — CEFAZOLIN SODIUM-DEXTROSE 2-4 GM/100ML-% IV SOLN
2.0000 g | INTRAVENOUS | Status: AC
  Administered 2021-10-24: 2 g via INTRAVENOUS
  Filled 2021-10-24: qty 100

## 2021-10-24 MED ORDER — ROCURONIUM BROMIDE 10 MG/ML (PF) SYRINGE
PREFILLED_SYRINGE | INTRAVENOUS | Status: DC | PRN
Start: 2021-10-24 — End: 2021-10-24
  Administered 2021-10-24: 40 mg via INTRAVENOUS

## 2021-10-24 MED ORDER — DEXAMETHASONE SODIUM PHOSPHATE 4 MG/ML IJ SOLN
INTRAMUSCULAR | Status: DC | PRN
Start: 2021-10-24 — End: 2021-10-24
  Administered 2021-10-24: 4 mg via INTRAVENOUS

## 2021-10-24 MED ORDER — BUPIVACAINE HCL (PF) 0.5 % IJ SOLN
INTRAMUSCULAR | Status: DC | PRN
  Administered 2021-10-24: 10 mL via PERINEURAL

## 2021-10-24 MED ORDER — FENTANYL CITRATE (PF) 100 MCG/2ML IJ SOLN
INTRAMUSCULAR | Status: DC | PRN
Start: 2021-10-24 — End: 2021-10-24
  Administered 2021-10-24 (×2): 50 ug via INTRAVENOUS

## 2021-10-24 MED ORDER — HYDRALAZINE HCL 20 MG/ML IJ SOLN
INTRAMUSCULAR | Status: AC
Start: 2021-10-24 — End: 2021-10-25
  Filled 2021-10-24: qty 1

## 2021-10-24 MED ORDER — ONDANSETRON HCL 4 MG PO TABS: 4.0000 mg | ORAL_TABLET | Freq: Four times a day (QID) | ORAL | Status: DC | PRN

## 2021-10-24 MED ORDER — OXYCODONE HCL 5 MG/5ML PO SOLN: 5.0000 mg | Freq: Once | ORAL | Status: DC | PRN

## 2021-10-24 MED ORDER — ONDANSETRON HCL 4 MG/2ML IJ SOLN: 4.0000 mg | Freq: Four times a day (QID) | INTRAMUSCULAR | Status: DC | PRN

## 2021-10-24 MED ORDER — HYDRALAZINE HCL 20 MG/ML IJ SOLN
10.0000 mg | Freq: Once | INTRAMUSCULAR | Status: AC
  Administered 2021-10-24: 10 mg via INTRAVENOUS

## 2021-10-24 MED ORDER — CHLORHEXIDINE GLUCONATE 0.12 % MT SOLN
15.0000 mL | Freq: Once | OROMUCOSAL | Status: AC
  Administered 2021-10-24: 15 mL via OROMUCOSAL

## 2021-10-24 MED ORDER — HYDROCODONE-ACETAMINOPHEN 5-325 MG PO TABS: 1.0000 | ORAL_TABLET | ORAL | 0 refills | Status: DC | PRN

## 2021-10-24 MED ORDER — MORPHINE SULFATE (PF) 2 MG/ML IV SOLN: 0.5000 mg | INTRAVENOUS | Status: DC | PRN

## 2021-10-24 MED ORDER — 0.9 % SODIUM CHLORIDE (POUR BTL) OPTIME
TOPICAL | Status: DC | PRN
  Administered 2021-10-24 (×2): 1000 mL

## 2021-10-24 MED ORDER — ASPIRIN EC 325 MG PO TBEC
325.0000 mg | DELAYED_RELEASE_TABLET | Freq: Every day | ORAL | Status: DC
  Administered 2021-10-25 – 2021-10-26 (×2): 325 mg via ORAL
  Filled 2021-10-24 (×2): qty 1

## 2021-10-24 MED ORDER — PROPOFOL 10 MG/ML IV BOLUS
INTRAVENOUS | Status: DC | PRN
  Administered 2021-10-24: 30 mg via INTRAVENOUS
  Administered 2021-10-24: 70 mg via INTRAVENOUS
  Administered 2021-10-24: 30 mg via INTRAVENOUS

## 2021-10-24 MED ORDER — EPHEDRINE SULFATE-NACL 50-0.9 MG/10ML-% IV SOSY
PREFILLED_SYRINGE | INTRAVENOUS | Status: DC | PRN
Start: 2021-10-24 — End: 2021-10-24

## 2021-10-24 SURGICAL SUPPLY — 65 items
AID PSTN UNV HD RSTRNT DISP (MISCELLANEOUS)
APL PRP STRL LF DISP 70% ISPRP (MISCELLANEOUS)
BAG COUNTER SPONGE SURGICOUNT (BAG) IMPLANT
BAG SPEC THK2 15X12 ZIP CLS (MISCELLANEOUS) ×1
BAG SPNG CNTER NS LX DISP (BAG)
BAG ZIPLOCK 12X15 (MISCELLANEOUS) ×3 IMPLANT
BLADE SAG 18X100X1.27 (BLADE) ×3 IMPLANT
BSPLAT GLND +2X24 MDLR (Joint) ×1 IMPLANT
CHLORAPREP W/TINT 26 (MISCELLANEOUS) IMPLANT
COVER BACK TABLE 60X90IN (DRAPES) ×3 IMPLANT
COVER SURGICAL LIGHT HANDLE (MISCELLANEOUS) ×3 IMPLANT
DRAPE ORTHO SPLIT 77X108 STRL (DRAPES) ×4
DRAPE SHEET LG 3/4 BI-LAMINATE (DRAPES) ×3 IMPLANT
DRAPE SURG 17X11 SM STRL (DRAPES) ×3 IMPLANT
DRAPE SURG ORHT 6 SPLT 77X108 (DRAPES) ×4 IMPLANT
DRAPE TOP 10253 STERILE (DRAPES) ×3 IMPLANT
DRAPE U-SHAPE 47X51 STRL (DRAPES) ×3 IMPLANT
DRSG AQUACEL AG ADV 3.5X 6 (GAUZE/BANDAGES/DRESSINGS) IMPLANT
DRSG AQUACEL AG ADV 3.5X10 (GAUZE/BANDAGES/DRESSINGS) ×3 IMPLANT
ELECT REM PT RETURN 15FT ADLT (MISCELLANEOUS) ×3 IMPLANT
FACESHIELD WRAPAROUND (MASK) ×2 IMPLANT
FACESHIELD WRAPAROUND OR TEAM (MASK) ×1 IMPLANT
GLENOID UNI REV MOD 24 +2 LAT (Joint) ×2 IMPLANT
GLENOSPHERE 36 +4 LAT/24 (Joint) ×2 IMPLANT
GLOVE SRG 8 PF TXTR STRL LF DI (GLOVE) ×4 IMPLANT
GLOVE SURG ENC MOIS LTX SZ7.5 (GLOVE) ×12 IMPLANT
GLOVE SURG UNDER POLY LF SZ8 (GLOVE) ×4
GOWN STRL REUS W/ TWL XL LVL3 (GOWN DISPOSABLE) ×4 IMPLANT
GOWN STRL REUS W/TWL XL LVL3 (GOWN DISPOSABLE) ×4
KIT BASIN OR (CUSTOM PROCEDURE TRAY) ×3 IMPLANT
KIT TURNOVER KIT A (KITS) IMPLANT
LINER HUMERAL 36 +3MM SM (Shoulder) ×2 IMPLANT
MANIFOLD NEPTUNE II (INSTRUMENTS) ×3 IMPLANT
NDL TAPERED W/ NITINOL LOOP (MISCELLANEOUS) IMPLANT
NEEDLE TAPERED W/ NITINOL LOOP (MISCELLANEOUS)
NS IRRIG 1000ML POUR BTL (IV SOLUTION) ×3 IMPLANT
PACK SHOULDER (CUSTOM PROCEDURE TRAY) ×3 IMPLANT
PROTECTOR NERVE ULNAR (MISCELLANEOUS) ×3 IMPLANT
RESTRAINT HEAD UNIVERSAL NS (MISCELLANEOUS) IMPLANT
SCREW CENTRAL MOD 30MM (Screw) ×2 IMPLANT
SCREW PERI LOCK 5.5X16 (Screw) ×4 IMPLANT
SCREW PERI LOCK 5.5X32 (Screw) ×2 IMPLANT
SCREW PERIPHERAL 5.5X28 LOCK (Screw) ×2 IMPLANT
SLING ARM FOAM STRAP MED (SOFTGOODS) IMPLANT
SLING ARM IMMOBILIZER MED (SOFTGOODS) ×2 IMPLANT
SMARTMIX MINI TOWER (MISCELLANEOUS)
SPONGE T-LAP 18X18 ~~LOC~~+RFID (SPONGE) ×3 IMPLANT
SPONGE T-LAP 4X18 ~~LOC~~+RFID (SPONGE) IMPLANT
STEM REVERSE SIZE 5-135 (Shoulder) ×2 IMPLANT
STRIP CLOSURE SKIN 1/2X4 (GAUZE/BANDAGES/DRESSINGS) ×3 IMPLANT
SUCTION FRAZIER HANDLE 10FR (MISCELLANEOUS) ×2
SUCTION TUBE FRAZIER 10FR DISP (MISCELLANEOUS) ×2 IMPLANT
SUT FIBERWIRE #2 38 T-5 BLUE (SUTURE)
SUT MON AB 3-0 SH 27 (SUTURE) ×2
SUT MON AB 3-0 SH27 (SUTURE) ×2 IMPLANT
SUT VIC AB 0 CT1 36 (SUTURE) ×3 IMPLANT
SUT VIC AB 1 CT1 36 (SUTURE) ×3 IMPLANT
SUT VIC AB 2-0 CT1 27 (SUTURE) ×2
SUT VIC AB 2-0 CT1 TAPERPNT 27 (SUTURE) ×2 IMPLANT
SUTURE FIBERWR #2 38 T-5 BLUE (SUTURE) IMPLANT
SUTURE TAPE 1.3 40 TPR END (SUTURE) ×2 IMPLANT
SUTURETAPE 1.3 40 TPR END (SUTURE) ×4
TOWEL OR 17X26 10 PK STRL BLUE (TOWEL DISPOSABLE) ×3 IMPLANT
TOWER SMARTMIX MINI (MISCELLANEOUS) IMPLANT
TUBE SUCTION HIGH CAP CLEAR NV (SUCTIONS) ×3 IMPLANT

## 2021-10-24 NOTE — Transfer of Care (Signed)
Immediate Anesthesia Transfer of Care Note  Patient: Heather Ellis  Procedure(s) Performed: REVERSE SHOULDER ARTHROPLASTY (Left: Shoulder)  Patient Location: PACU  Anesthesia Type:GA combined with regional for post-op pain  Level of Consciousness: awake and patient cooperative  Airway & Oxygen Therapy: Patient Spontanous Breathing and Patient connected to face mask  Post-op Assessment: Report given to RN and Post -op Vital signs reviewed and stable  Post vital signs: Reviewed and stable  Last Vitals:  Vitals Value Taken Time  BP 198/104 10/24/21 1238  Temp    Pulse 65 10/24/21 1241  Resp 14 10/24/21 1241  SpO2 100 % 10/24/21 1241  Vitals shown include unvalidated device data.  Last Pain:  Vitals:   10/24/21 1014  TempSrc:   PainSc: 0-No pain         Complications: No notable events documented.

## 2021-10-24 NOTE — Plan of Care (Signed)
  Problem: Education: Goal: Knowledge of General Education information will improve Description: Including pain rating scale, medication(s)/side effects and non-pharmacologic comfort measures Outcome: Progressing   Problem: Pain Managment: Goal: General experience of comfort will improve Outcome: Progressing   Problem: Safety: Goal: Ability to remain free from injury will improve Outcome: Progressing   

## 2021-10-24 NOTE — Progress Notes (Signed)
°  Transition of Care [facility]) Screening Note   Patient Details  Name: Heather Ellis Date of Birth: Oct 05, 1945   Transition of Care [facility]) CM/SW Contact:    Lennart Pall, LCSW Phone Number: 10/24/2021, 3:04 PM    Transition of Care Department [facility]) has reviewed patient and no TOC needs have been identified at this time. We will continue to monitor patient advancement through interdisciplinary progression rounds. If new patient transition needs arise, please place a TOC consult.

## 2021-10-24 NOTE — H&P (Signed)
ORTHOPAEDIC H and P  REQUESTING PHYSICIAN: Nicholes Stairs, MD  PCP:  [name], MD  Chief Complaint: Left shoulder rotator cuff arthropathy  HPI: Heather Ellis is a 76 y.o. female who complains of chronic left shoulder pain and weakness.  She has had exhaustive conservative treatment and is here today for definitive care with reverse arthroplasty.  No new complaints today.  Past Medical History:  Diagnosis Date   Acid reflux    Anemia    Asthma    Cancer (HCC)    Right lung   COPD (chronic obstructive pulmonary disease) (HCC)    Dyspnea    WITH EXERTION   H/O polymyalgia rheumatica    History of COVID-19 2020   Hypertension    OA (osteoarthritis)    Obesity    Osteoporosis    Pre-diabetes    Syncope    Syncope and collapse 08/2018   Vitamin D deficiency    Past Surgical History:  Procedure Laterality Date   ABDOMINAL HYSTERECTOMY     TOTAL ABDOMINAL HYSTERECTOMY    BRONCHIAL BIOPSY  05/12/2021   Procedure: BRONCHIAL BIOPSIES;  Surgeon: Collene Gobble, MD;  Location: [place];  Service: Pulmonary;;   BRONCHIAL BRUSHINGS  05/12/2021   Procedure: BRONCHIAL BRUSHINGS;  Surgeon: Collene Gobble, MD;  Location: [place];  Service: Pulmonary;;   BRONCHIAL NEEDLE ASPIRATION BIOPSY  05/12/2021   Procedure: BRONCHIAL NEEDLE ASPIRATION BIOPSIES;  Surgeon: Collene Gobble, MD;  Location: [facility];  Service: Pulmonary;;   COLONOSCOPY W/ BIOPSIES  2015   ESOPHAGOGASTRODUODENOSCOPY     FIDUCIAL MARKER PLACEMENT  05/12/2021   Procedure: FIDUCIAL MARKER PLACEMENT;  Surgeon: Collene Gobble, MD;  Location: [facility];  Service: Pulmonary;;   OOPHORECTOMY     BSO   TOTAL HIP ARTHROPLASTY Left 01/16/2020   Procedure: TOTAL HIP ARTHROPLASTY ANTERIOR APPROACH;  Surgeon: Renette Butters, MD;  Location: [facility];  Service: Orthopedics;  Laterality: Left;   VIDEO BRONCHOSCOPY WITH ENDOBRONCHIAL NAVIGATION Right 05/12/2021   Procedure: ROBOTIC ASSISTED  BRONCHOSCOPY WITH ENDOBRONCHIAL NAVIGATION;  Surgeon: Collene Gobble, MD;  Location: [place] [facility];  Service: Pulmonary;  Laterality: Right;   VIDEO BRONCHOSCOPY WITH RADIAL ENDOBRONCHIAL ULTRASOUND  05/12/2021   Procedure: VIDEO BRONCHOSCOPY WITH RADIAL ENDOBRONCHIAL ULTRASOUND;  Surgeon: Collene Gobble, MD;  Location: [facility];  Service: Pulmonary;;   Social History   Socioeconomic History   Marital status: Legally Separated    Spouse name: Not on file   Number of children: Not on file   Years of education: Not on file   Highest education level: Not on file  Occupational History   Not on file  Tobacco Use   Smoking status: Former    Types: Cigarettes    Quit date: 10/19/2014    Years since quitting: 7.0   Smokeless tobacco: Never  Vaping Use   Vaping Use: Never used  Substance and Sexual Activity   Alcohol use: Not Currently   Drug use: No   Sexual activity: Never    Birth control/protection: Surgical    Comment: 1st intercourse 70 yo-1 partner  Other Topics Concern   Not on file  Social History Narrative   Not on file   Social Determinants of Health   Financial Resource Strain: Not on file  Food Insecurity: Not on file  Transportation Needs: Not on file  Physical Activity: Not on file  Stress: Not on file  Social Connections: Not on file   Family History  Problem  Relation Age of Onset   Cancer Mother        THROAT- SMOKER   Diabetes Mother    Seizures Mother    Syncope episode Mother    Diabetes Sister    Heart disease Maternal Aunt    Stroke Maternal Aunt    Cancer Maternal Uncle        LUNG -    Heart disease Maternal Uncle    Stroke Maternal Uncle    Ovarian cancer Maternal Grandmother    Heart attack Father    Heart disease Father    Syncope episode Brother    Allergies  Allergen Reactions   Fosamax [Alendronate Sodium] Nausea And Vomiting   Prior to Admission medications   Medication Sig Start Date End Date Taking? Authorizing Provider   bisoprolol-hydrochlorothiazide (ZIAC) 5-6.25 MG tablet Take 2 tablets by mouth in the morning. 09/27/19  Yes [provider]  carboxymethylcellulose (REFRESH PLUS) 0.5 % SOLN Place 1 drop into both eyes 3 (three) times daily as needed (dry eyes).   Yes [provider]  cholecalciferol (VITAMIN D) 1000 UNITS tablet Take 1,000 Units by mouth daily in the afternoon.   Yes [provider]  folic acid (FOLVITE) 1 MG tablet Take 1 mg by mouth daily. 09/11/18  Yes [provider]  lisinopril (ZESTRIL) 40 MG tablet Take 40 mg by mouth daily. 12/21/19  Yes [provider]  Menthol, Topical Analgesic, (BIOFREEZE EX) Apply 1 application topically daily as needed (soreness).   Yes [provider]  methotrexate 2.5 MG tablet Take 20 mg by mouth every Monday.   Yes [provider]  omeprazole (PRILOSEC) 20 MG capsule Take 20 mg by mouth daily as needed (acid reflux/heartburn).   Yes [provider]  predniSONE (DELTASONE) 5 MG tablet Take 5 mg by mouth every other day.   Yes [provider]  Tiotropium Bromide-Olodaterol (STIOLTO RESPIMAT) 2.5-2.5 MCG/ACT AERS Inhale 2 puffs into the lungs daily. Patient taking differently: Inhale 2 puffs into the lungs daily as needed (shortness of breath). 04/23/21  Yes Collene Gobble, MD   No results found.  Positive ROS: All other systems have been reviewed and were otherwise negative with the exception of those mentioned in the HPI and as above.  Physical Exam: General: Alert, no acute distress Cardiovascular: No pedal edema Respiratory: No cyanosis, no use of accessory musculature GI: No organomegaly, abdomen is soft and non-tender Skin: No lesions in the area of chief complaint Neurologic: Sensation intact distally Psychiatric: Patient is competent for consent with normal mood and affect Lymphatic: No axillary or cervical lymphadenopathy  MUSCULOSKELETAL:  Left upper extremity is warm  and well-perfused with no open wounds or lesions.  She is neurovascular intact throughout.  Assessment: Left shoulder rotator cuff arthropathy.  Plan: -Plan to proceed today with reverse arthroplasty of the left shoulder.  We again discussed the risk and benefits of the procedure in detail.  We discussed the risk of bleeding, infection, damage to surrounding nerves and vessels, stiffness, dislocation, fracture, need for revision surgery as well as the risk associated with anesthesia.  She has provided informed consent.  -Plan for admission postoperatively for overnight observation.    Nicholes Stairs, MD Cell [phone]    10/24/2021 9:40 AM

## 2021-10-24 NOTE — Op Note (Signed)
10/24/2021  12:54 PM  PATIENT:  Heather Ellis    PRE-OPERATIVE DIAGNOSIS:  Left shoulder rotator cuff arthropathy  POST-OPERATIVE DIAGNOSIS:  Same  PROCEDURE:  Left REVERSE SHOULDER ARTHROPLASTY  SURGEON:  Nicholes Stairs, MD  ASSISTANT: Jonelle Sidle, PA-C  ANESTHESIA:   General suprascapular block  ESTIMATED BLOOD LOSS: 5 cc1  PREOPERATIVE INDICATIONS:  Heather Ellis is a  76 y.o. female with a diagnosis of Left shoulder rotator cuff arthropathy who failed conservative measures and elected for surgical management.    The risks benefits and alternatives were discussed with the patient preoperatively including but not limited to the risks of infection, bleeding, nerve injury, cardiopulmonary complications, the need for revision surgery, dislocation, brachial plexus palsy, incomplete relief of pain, among others, and the patient was willing to proceed.  OPERATIVE IMPLANTS:  Arthrex size 5 monoblock stem with a 36 humeral tray and a +3 polyethylene on the humeral side. Glenoid was a 36+4 lateralized.  Baseplate was a 72+5 lateralized.  30 mm central screw with 4 peripheral locking screws.  OPERATIVE FINDINGS: Advanced osteoarthritis of the glenohumeral joint with complete absence of the supraspinatus.  Teres minor as well as subscapularis were intact.  Subchondral collapse and flattening are noted of the humeral head.  OPERATIVE PROCEDURE: The patient was brought to the operating room and placed in the supine position. General anesthesia was administered. IV antibiotics were given. A Foley was not placed. Time out was performed. The upper extremity was prepped and draped in usual sterile fashion. The patient was in a beachchair position. Deltopectoral approach was carried out. The biceps was tenotomized.. The subscapularis was released off of the bone.   I then performed circumferential releases of the humerus, and then dislocated the head, and then reamed with the reamer  to the above named size.  I then applied the jig, and cut the humeral head in 30 of retroversion, and then turned my attention to the glenoid.  Deep retractors were placed, and I resected the labrum, and then placed a guidepin into the center position on the glenoid, with slight inferior inclination. I then reamed over the guidepin, and this created a small metaphyseal cancellus blush inferiorly, removing just the cartilage to the subchondral bone superiorly. The base plate was selected and screwed into place, and then I secured it centrally with a nonlocking screw, and I had excellent purchase both inferiorly and superiorly. I placed a short locking screws on anterior and posterior aspects.  Glenosphere was then impacted  The glenoid sphere was completely seated, central setscrew placed and had engagement of the [facility] taper. I then turned my attention back to the humerus.  I sequentially broached, and then trialed, and was found to restore soft tissue tension, and it had 2 finger tightness. Therefore the above named components were selected. The shoulder felt stable throughout functional motion.   I then impacted the real prosthesis into place, as well as the real humeral tray, and reduced the shoulder. The shoulder had excellent motion, and was stable, and I irrigated the wounds copiously.   Subscapularis was not repaired  I then irrigated the shoulder copiously once more, a gram of vancomycin powder was placed into the deltopectoral interval, repaired the deltopectoral interval with #2 FiberWire followed by subcutaneous Vicryl, then monocryl for the skin,  with Steri-Strips and sterile gauze for the skin. The patient was awakened and returned back in stable and satisfactory condition. There no complications and they tolerated the procedure well.  All  counts were correct x2.   Disposition:  Patient will be nonweightbearing to the left upper extremity.  She may use it for activities of daily  living.  She will be admitted for postoperative monitoring and likely discharge home tomorrow.  She will wear her sling while sleeping.  I will see her back in the office in 2 weeks with x-rays of the left shoulder.

## 2021-10-24 NOTE — Discharge Instructions (Addendum)
Orthopedic surgery discharge instructions:  -Maintain postoperative bandage until follow-up appointment.  This is waterproof, and you may begin showering on postoperative day #3.  Do not submerge underwater.  Maintain that bandage until your follow-up appointment in 2 weeks.  -No lifting over 2 pounds with operative arm.  You may use the arm immediately for activities of daily living such as bathing, washing your face and brushing your teeth, eating, and getting dressed.  Otherwise maintain your sling when you are out of the house and sleeping.  -Apply ice liberally to the shoulder throughout the day.  For mild to moderate pain use Tylenol and Advil as needed around-the-clock.  For breakthrough pain use oxycodone as necessary.  -You will return to see Dr. Stann Mainland in the office in 2 weeks for routine postoperative check with x-rays.

## 2021-10-24 NOTE — Brief Op Note (Signed)
10/24/2021  12:38 PM  PATIENT:  Heather Ellis  76 y.o. female  PRE-OPERATIVE DIAGNOSIS:  Left shoulder rotator cuff arthropathy  POST-OPERATIVE DIAGNOSIS:  Left shoulder rotator cuff arthropathy  PROCEDURE:  Procedure(s): REVERSE SHOULDER ARTHROPLASTY (Left)  SURGEON:  Surgeon(s) and Role:    * Nicholes Stairs, MD - Primary  PHYSICIAN ASSISTANT: Jonelle Sidle, PA-C   ANESTHESIA:   regional and general  EBL:  100 mL   BLOOD ADMINISTERED:none  DRAINS: none   LOCAL MEDICATIONS USED:  NONE  SPECIMEN:  No Specimen  DISPOSITION OF SPECIMEN:  N/A  COUNTS:  YES  TOURNIQUET:  * No tourniquets in log *  DICTATION: .Note written in EPIC  PLAN OF CARE: Discharge to home after PACU  PATIENT DISPOSITION:  PACU - hemodynamically stable.   Delay start of Pharmacological VTE agent (>24hrs) due to surgical blood loss or risk of bleeding: not applicable

## 2021-10-24 NOTE — Anesthesia Procedure Notes (Signed)
Procedure Name: Intubation Date/Time: 10/24/2021 10:45 AM Performed by: Claudia Desanctis, CRNA Pre-anesthesia Checklist: Patient identified, Emergency Drugs available, Suction available and Patient being monitored Patient Re-evaluated:Patient Re-evaluated prior to induction Oxygen Delivery Method: Circle system utilized Preoxygenation: Pre-oxygenation with 100% oxygen Induction Type: IV induction Ventilation: Mask ventilation without difficulty Laryngoscope Size: 2 and Miller Grade View: Grade I Tube type: Oral Tube size: 7.0 mm Number of attempts: 1 Airway Equipment and Method: Stylet Placement Confirmation: ETT inserted through vocal cords under direct vision, positive ETCO2 and breath sounds checked- equal and bilateral Secured at: 21 cm Tube secured with: Tape Dental Injury: Teeth and Oropharynx as per pre-operative assessment

## 2021-10-24 NOTE — Progress Notes (Signed)
AssistedDr. Lissa Hoard with left, ultrasound guided Suprascapular block. Side rails up, monitors on throughout procedure. See vital signs in flow sheet. Tolerated Procedure well.

## 2021-10-24 NOTE — Anesthesia Procedure Notes (Signed)
Anesthesia Regional Block: Other (Suprascapular nerve block)   Pre-Anesthetic Checklist: , timeout performed,  Correct Patient, Correct Site, Correct Laterality,  Correct Procedure, Correct Position, site marked,  Risks and benefits discussed,  Surgical consent,  Pre-op evaluation,  At surgeon's request and post-op pain management  Laterality: Upper and Left  Prep: chloraprep       Needles:  Injection technique: Single-shot  Needle Type: Stimulator Needle - 40     Needle Length: 4cm  Needle Gauge: 22     Additional Needles:   Procedures:,,,, ultrasound used (permanent image in chart),,    Narrative:  Start time: 10/24/2021 9:50 AM End time: 10/24/2021 10:10 AM Injection made incrementally with aspirations every 5 mL.  Performed by: Personally  Anesthesiologist: Nolon Nations, MD  Additional Notes: BP cuff, SpO2 and EKG monitors applied. Sedation begun. Nerve location verified with ultrasound. Anesthetic injected incrementally, slowly, and after neg aspirations under direct u/s guidance. Good perineural spread. Tolerated well.

## 2021-10-25 DIAGNOSIS — M75102 Unspecified rotator cuff tear or rupture of left shoulder, not specified as traumatic: Secondary | ICD-10-CM | POA: Diagnosis not present

## 2021-10-25 MED ORDER — HYDROCODONE-ACETAMINOPHEN 7.5-325 MG PO TABS: 1.0000 | ORAL_TABLET | Freq: Four times a day (QID) | ORAL | 0 refills | Status: DC | PRN

## 2021-10-25 NOTE — Evaluation (Addendum)
Occupational Therapy Evaluation Patient Details Name: Heather Ellis MRN: [id number] DOB: March 01, 1946 Today's Date: 10/25/2021   History of Present Illness Patient is a 76 year old woman s/p left reverse TSA   Clinical Impression   [name] is a 76 year old woman s/p shoulder replacement without functional use of left non-dominant upper extremity secondary to effects of surgery and interscalene block and shoulder precautions. Therapist provided education and instruction to patient n regards to exercises, precautions, positioning, donning upper extremity clothing and bathing while maintaining shoulder precautions, ice and edema management and donning/doffing sling. Patient verbalized understanding and handouts provided to maximize retention of information Patient needed assistance to donn shirt, underwear, and pants. Patient will be going home with assistance of daughter and therapist will need to return to educate family.       Recommendations for follow up therapy are one component of a multi-disciplinary discharge planning process, led by the attending physician.  Recommendations may be updated based on patient status, additional functional criteria and insurance authorization.   Follow Up Recommendations  Follow physician's recommendations for discharge plan and follow up therapies    Assistance Recommended at Discharge Intermittent Supervision/Assistance  Patient can return home with the following A little help with bathing/dressing/bathroom;Assistance with cooking/housework    Functional Status Assessment  Patient has had a recent decline in their functional status and demonstrates the ability to make significant improvements in function in a reasonable and predictable amount of time.  Equipment Recommendations  None recommended by OT    Recommendations for Other Services       Precautions / Restrictions Precautions Precautions: Shoulder Type of Shoulder  Precautions: FF AROM/PROM to 0-90 degress, ER AROM/PROM 0-30 degrees, no abduction Shoulder Interventions: Shoulder sling/immobilizer;For comfort (at night for sleep) Precaution Booklet Issued:  (handouts) Precaution Comments: Can use arm for ADLs Restrictions Weight Bearing Restrictions: Yes LUE Weight Bearing: Non weight bearing                 Vision Patient Visual Report: No change from baseline              Pertinent Vitals/Pain Pain Assessment Pain Assessment: Faces Faces Pain Scale: Hurts little more Pain Location: L shoulder Pain Descriptors / Indicators: Aching, Throbbing, Grimacing Pain Intervention(s): Limited activity within patient's tolerance     Hand Dominance Right   Extremity/Trunk Assessment Upper Extremity Assessment Upper Extremity Assessment: RUE deficits/detail;LUE deficits/detail RUE Deficits / Details: WFL ROM and strength LUE Deficits / Details: decreased AROM secondary to block - only able to tolerate approx 30 degrees PROM flexion, and to neutral external roation LUE: Shoulder pain with ROM   Lower Extremity Assessment Lower Extremity Assessment: Overall WFL for tasks assessed   Cervical / Trunk Assessment Cervical / Trunk Assessment: Normal   Communication Communication Communication: No difficulties   Cognition Arousal/Alertness: Awake/alert Behavior During Therapy: WFL for tasks assessed/performed Overall Cognitive Status: Within Functional Limits for tasks assessed                                             Shoulder Instructions Shoulder Instructions Donning/doffing shirt without moving shoulder: Patient able to independently direct caregiver Method for sponge bathing under operated UE: Patient able to independently direct caregiver Donning/doffing sling/immobilizer: Patient able to independently direct caregiver Correct positioning of sling/immobilizer: Patient able to independently direct caregiver ROM for  elbow, wrist  and digits of operated UE: Patient able to independently direct caregiver Sling wearing schedule (on at all times/off for ADL's): Patient able to independently direct caregiver Proper positioning of operated UE when showering: Patient able to independently direct caregiver Dressing change: Patient able to independently direct caregiver Positioning of UE while sleeping: Patient able to independently direct caregiver    Home Living Family/patient expects to be discharged to:: Private residence Living Arrangements: Children Available Help at Discharge: Available PRN/intermittently (will have daughter at home with all the time until THursday. Son lives with her but she states "I can't depend on him.") Type of Home: House Home Access: Stairs to enter Technical brewer of Steps: 1   Home Layout: One level     Bathroom Shower/Tub: Teacher, early years/pre: Standard     Home Equipment: Conservation officer, nature (2 wheels);Cane - single point          Prior Functioning/Environment Prior Level of Function : Independent/Modified Independent                        OT Problem List: Decreased strength;Decreased range of motion;Impaired UE functional use;Pain      OT Treatment/Interventions: Self-care/ADL training;Therapeutic exercise;Patient/family education;Therapeutic activities    OT Goals(Current goals can be found in the care plan section) Acute Rehab OT Goals Patient Stated Goal: not mess it up OT Goal Formulation: With patient Time For Goal Achievement: 11/02/21 Potential to Achieve Goals: Good ADL Goals Additional ADL Goal #1: Patient and family will verbalize understanding of all instruction and education prior to discharge.  OT Frequency: Min 2X/week    Co-evaluation              AM-PAC OT "6 Clicks" Daily Activity     Outcome Measure Help from another person eating meals?: A Little Help from another person taking care of personal  grooming?: A Little Help from another person toileting, which includes using toliet, bedpan, or urinal?: A Little Help from another person bathing (including washing, rinsing, drying)?: A Little Help from another person to put on and taking off regular upper body clothing?: A Lot Help from another person to put on and taking off regular lower body clothing?: A Little 6 Click Score: 17   End of Session Nurse Communication:  (will need to educate family when they arrive)  Activity Tolerance: Patient tolerated treatment well Patient left: in chair;with call bell/phone within reach;with family/visitor present  OT Visit Diagnosis: Muscle weakness (generalized) (M62.81);Pain Pain - Right/Left: Left Pain - part of body: Shoulder                Time: 2620-3559 OT Time Calculation (min): 25 min Charges:  OT General Charges $OT Visit: 1 Visit OT Evaluation $OT Eval Low Complexity: 1 Low OT Treatments $Self Care/Home Management : 8-22 mins  Airika Alkhatib, OTR/L Acute Care Rehab Services  Office [phone] Pager: [place] 10/25/2021, 11:24 AM

## 2021-10-25 NOTE — Plan of Care (Signed)
  Problem: Education: Goal: Knowledge of General Education information will improve Description: Including pain rating scale, medication(s)/side effects and non-pharmacologic comfort measures Outcome: Progressing   Problem: Activity: Goal: Risk for activity intolerance will decrease Outcome: Progressing   Problem: Pain Managment: Goal: General experience of comfort will improve Outcome: Progressing   

## 2021-10-25 NOTE — Anesthesia Postprocedure Evaluation (Signed)
Anesthesia Post Note  Patient: Heather Ellis  Procedure(s) Performed: REVERSE SHOULDER ARTHROPLASTY (Left: Shoulder)     Patient location during evaluation: PACU Anesthesia Type: General Level of consciousness: sedated and patient cooperative Pain management: pain level controlled Vital Signs Assessment: post-procedure vital signs reviewed and stable Respiratory status: spontaneous breathing Cardiovascular status: stable Anesthetic complications: no   No notable events documented.  Last Vitals:  Vitals:   10/25/21 1032 10/25/21 1336  BP: 132/75 127/75  Pulse: 72 70  Resp: 18 18  Temp: 36.8 C 36.8 C  SpO2: 96% 96%    Last Pain:  Vitals:   10/25/21 1800  TempSrc:   PainSc: 0-No pain                 Nolon Nations

## 2021-10-25 NOTE — Progress Notes (Signed)
° °  Subjective:  Heather Ellis is a 76 y.o. female, 1 Day Post-Op    s/p Procedure(s): REVERSE SHOULDER ARTHROPLASTY   Patient reports pain as mild to moderate.  Reports she is doing over well has been doing okay with her breathing try to work with her incentive spirometer.  Does report her pain is not bad still feels some numbness at the shoulder.  Denies fever or chills.  Positive flatus.   Objective:   VITALS:   Vitals:   10/24/21 2132 10/25/21 0127 10/25/21 0523 10/25/21 1032  BP: 129/87 (!) 157/78 127/77 132/75  Pulse: 79 82 86 72  Resp: 17 17 16 18   Temp: 98.9 F (37.2 C) 98.2 F (36.8 C) 98.8 F (37.1 C) 98.2 F (36.8 C)  TempSrc: Oral Oral Oral Oral  SpO2: 96% 97% 97% 96%  Weight:      Height:       Left upper extremity: No acute distress  Neurovascular intact Sensation intact distally Intact pulses distally Dorsiflexion/Plantar flexion intact Incision: dressing C/D/I Compartment soft Aquacel present, clean dry intact with sling present.  Normal active elbow motion full wrist and digit range of motion.  Sensation is intact distally.  Soft and supple bilateral calves.  Lab Results  Component Value Date   WBC 4.1 10/10/2021   HGB 11.3 (L) 10/10/2021   HCT 36.8 10/10/2021   MCV 97.1 10/10/2021   PLT 216 10/10/2021   BMET    Component Value Date/Time   NA 138 10/10/2021 0943   NA 146 (H) 10/26/2018 0816   K 4.1 10/10/2021 0943   CL 103 10/10/2021 0943   CO2 30 10/10/2021 0943   GLUCOSE 98 10/10/2021 0943   BUN 16 10/10/2021 0943   BUN 17 10/26/2018 0816   CREATININE 0.71 10/10/2021 0943   CREATININE 0.70 05/29/2021 1348   CALCIUM 9.4 10/10/2021 0943   GFRNONAA >60 10/10/2021 0943   GFRNONAA >60 05/29/2021 1348     Assessment/Plan: 1 Day Post-Op   Principal Problem:   S/P reverse total shoulder arthroplasty, left   Advance diet Up with therapy Plan for 1 more night of monitoring secondary to block and history of significant pulmonary  disease for monitoring of her pulmonary status.  Plan for discharge tomorrow.  Medication sent to pharmacy, changed to Norco7.5 due to shortage of 5-325 at her pharmacy.   Weightbearing Status: Nonweightbearing to the left upper extremity, sling on at all times except for showering and exercises working on elbow hand, digit ROM DVT Prophylaxis: Aspirin     Faythe Casa 10/25/2021, 11:15 AM  Jonelle Sidle PA-C  Physician Assistant with Dr. Rogers, [place]

## 2021-10-25 NOTE — Progress Notes (Signed)
Occupational Therapy Treatment Patient Details Name: Heather Ellis MRN: [id number] DOB: 04-02-1946 Today's Date: 10/25/2021   History of present illness Patient is a 76 year old woman s/p left reverse TSA   OT comments  Reiterated of education with patient and family in in room in regards to precautions, UE positioning, use during self care tasks, and exercise program. Handouts provided. All verbalize understanding.    Recommendations for follow up therapy are one component of a multi-disciplinary discharge planning process, led by the attending physician.  Recommendations may be updated based on patient status, additional functional criteria and insurance authorization.    Follow Up Recommendations  Follow physician's recommendations for discharge plan and follow up therapies    Assistance Recommended at Discharge Intermittent Supervision/Assistance  Patient can return home with the following  A little help with bathing/dressing/bathroom;Assistance with cooking/housework   Equipment Recommendations  None recommended by OT    Recommendations for Other Services      Precautions / Restrictions Precautions Precautions: Shoulder Type of Shoulder Precautions: FF AROM/PROM to 0-90 degress, ER AROM/PROM 0-30 degrees, no abduction Shoulder Interventions: Shoulder sling/immobilizer;For comfort Precaution Booklet Issued:  (handouts) Precaution Comments: Can use arm for ADLs Restrictions Weight Bearing Restrictions: Yes LUE Weight Bearing: Non weight bearing          Extremity/Trunk Assessment Upper Extremity Assessment Upper Extremity Assessment: RUE deficits/detail;LUE deficits/detail RUE Deficits / Details: WFL ROM and strength LUE Deficits / Details: decreased AROM secondary to block - only able to tolerate approx 30 degrees PROM flexion, and to neutral external roation LUE: Shoulder pain with ROM   Lower Extremity Assessment Lower Extremity Assessment: Overall WFL for  tasks assessed   Cervical / Trunk Assessment Cervical / Trunk Assessment: Normal    Vision Patient Visual Report: No change from baseline            Cognition Arousal/Alertness: Awake/alert Behavior During Therapy: WFL for tasks assessed/performed Overall Cognitive Status: Within Functional Limits for tasks assessed                                          Exercises      Shoulder Instructions Shoulder Instructions Donning/doffing shirt without moving shoulder: Patient able to independently direct caregiver Method for sponge bathing under operated UE: Patient able to independently direct caregiver Donning/doffing sling/immobilizer: Patient able to independently direct caregiver Correct positioning of sling/immobilizer: Patient able to independently direct caregiver ROM for elbow, wrist and digits of operated UE: Patient able to independently direct caregiver Sling wearing schedule (on at all times/off for ADL's): Patient able to independently direct caregiver Proper positioning of operated UE when showering: Patient able to independently direct caregiver Dressing change: Patient able to independently direct caregiver Positioning of UE while sleeping: Patient able to independently direct caregiver     General Comments      Pertinent Vitals/ Pain       Pain Assessment Pain Assessment: Faces Faces Pain Scale: Hurts little more Pain Location: L shoulder Pain Descriptors / Indicators: Aching, Throbbing, Grimacing Pain Intervention(s): Monitored during session  Home Living Family/patient expects to be discharged to:: Private residence Living Arrangements: Children Available Help at Discharge: Available PRN/intermittently (will have daughter at home with all the time until THursday. Son lives with her but she states "I can't depend on him.") Type of Home: House Home Access: Stairs to enter [employer] of Steps: 1  Home Layout: One level      Bathroom Shower/Tub: Teacher, early years/pre: Standard     Home Equipment: Conservation officer, nature (2 wheels);Cane - single point          Prior Functioning/Environment              Frequency  Min 2X/week        Progress Toward Goals  OT Goals(current goals can now be found in the care plan section)  Progress towards OT goals: Progressing toward goals  Acute Rehab OT Goals Patient Stated Goal: not mess it up OT Goal Formulation: With patient Time For Goal Achievement: 11/02/21 Potential to Achieve Goals: Good ADL Goals Additional ADL Goal #1: Patient and family will verbalize understanding of all instruction and education prior to discharge.  Plan Discharge plan remains appropriate    Co-evaluation                 AM-PAC OT "6 Clicks" Daily Activity     Outcome Measure   Help from another person eating meals?: A Little Help from another person taking care of personal grooming?: A Little Help from another person toileting, which includes using toliet, bedpan, or urinal?: A Little Help from another person bathing (including washing, rinsing, drying)?: A Little Help from another person to put on and taking off regular upper body clothing?: A Lot Help from another person to put on and taking off regular lower body clothing?: A Little 6 Click Score: 17    End of Session    OT Visit Diagnosis: Muscle weakness (generalized) (M62.81);Pain Pain - Right/Left: Left Pain - part of body: Shoulder   Activity Tolerance Patient tolerated treatment well   Patient Left in chair;with call bell/phone within reach;with family/visitor present   Nurse Communication  (will need to educate family when they arrive)        Time: 1041-1052 OT Time Calculation (min): 11 min  Charges: OT General Charges $OT Visit: 1 Visit OT Evaluation $OT Eval Low Complexity: 1 Low OT Treatments $Self Care/Home Management : 8-22 mins  Brandilee Pies, OTR/L Acute Care Rehab Services   Office [phone] Pager: [place] 10/25/2021, 11:28 AM

## 2021-10-25 NOTE — Plan of Care (Signed)
  Problem: Coping: Goal: Level of anxiety will decrease Outcome: Progressing   Problem: Pain Managment: Goal: General experience of comfort will improve Outcome: Progressing   

## 2021-10-26 DIAGNOSIS — M75102 Unspecified rotator cuff tear or rupture of left shoulder, not specified as traumatic: Secondary | ICD-10-CM | POA: Diagnosis not present

## 2021-10-26 NOTE — Plan of Care (Signed)
Careplans complete

## 2021-10-26 NOTE — Progress Notes (Signed)
Subjective: 2 Days Post-Op Procedure(s) (LRB): REVERSE SHOULDER ARTHROPLASTY (Left) Patient seen in rounds for Dr. Stann Mainland Patient reports pain as mild and well controlled on PO pain meds She is doing well, no events overnight. Block has worn off but overall pain is controlled.  Objective: Vital signs in last 24 hours: Temp:  [98.2 F (36.8 C)-98.9 F (37.2 C)] 98.9 F (37.2 C) (02/26 0603) Pulse Rate:  [70-85] 85 (02/26 0603) Resp:  [16-18] 16 (02/26 0603) BP: (127-152)/(75-88) 140/88 (02/26 0603) SpO2:  [94 %-98 %] 94 % (02/26 0603)  Intake/Output from previous day: 02/25 0701 - 02/26 0700 In: 360 [P.O.:360] Out: -  Intake/Output this shift: No intake/output data recorded.  No results for input(s): HGB in the last 72 hours. No results for input(s): WBC, RBC, HCT, PLT in the last 72 hours. No results for input(s): NA, K, CL, CO2, BUN, CREATININE, GLUCOSE, CALCIUM in the last 72 hours. No results for input(s): LABPT, INR in the last 72 hours.  Neurologically intact Neurovascular intact Sensation intact distally Intact pulses distally Dorsiflexion/Plantar flexion intact Incision: dressing C/D/I Compartment soft   Assessment/Plan: 2 Days Post-Op Procedure(s) (LRB): REVERSE SHOULDER ARTHROPLASTY (Left) Advance diet Patient is okay to be d/c home today Passed OT yesterday Pain is well controlled on PO pain meds and they have already been sent to pharmacy.  Leave dressing on  Remain in sling unless shower and ROM of fingers wrist and elbow Call office tomorrow to get a follow up appointment with Dr. Thornell Mule, PA-C [facility] [phone] 10/26/2021, 8:27 AM

## 2021-10-26 NOTE — Progress Notes (Signed)
Occupational Therapy Treatment Patient Details Name: Heather Ellis MRN: [id number] DOB: November 22, 1945 Today's Date: 10/26/2021   History of present illness Patient is a 76 year old woman s/p left reverse TSA   OT comments  Treatment focused on reiteration of all shoulder education and HEP program. Patient performed exercises with verbal cues from therapist. She has handouts to refer to at home. Patient able to don and doff shirt with min assist and therapist reiterated technique. Heavy reiteration on NWB status of upper extremity. Patient is allowed to come out of sling but is more comfortable in it but arm tends to work its way out of it. Therapist applied immobilizer belt to assist with maintaining position and patient reports it feels more supported. OT education complete. Patient can discharge home with family support.    Recommendations for follow up therapy are one component of a multi-disciplinary discharge planning process, led by the attending physician.  Recommendations may be updated based on patient status, additional functional criteria and insurance authorization.    Follow Up Recommendations  Follow physician's recommendations for discharge plan and follow up therapies    Assistance Recommended at Discharge Intermittent Supervision/Assistance  Patient can return home with the following  A little help with bathing/dressing/bathroom;Assistance with cooking/housework   Equipment Recommendations  None recommended by OT    Recommendations for Other Services      Precautions / Restrictions Precautions Precautions: Shoulder Type of Shoulder Precautions: FF AROM/PROM to 0-90 degress, ER AROM/PROM 0-30 degrees, no abduction Shoulder Interventions: Shoulder sling/immobilizer;For comfort Precaution Comments: Can use arm for ADLs Restrictions Weight Bearing Restrictions: Yes LUE Weight Bearing: Non weight bearing       Mobility Bed Mobility                     Transfers                         Balance                                           ADL either performed or assessed with clinical judgement   ADL                                         General ADL Comments: Reiterated all education in regards to self care and HEP with family in room. Performed donning and doffing of shirt to reiterate how to dress. Discussed compensatory strategies as well. Donned sling and added immobilizer belt just to assist with arm positioning - as it tends to slip out of sling.    Extremity/Trunk Assessment              Vision       Perception     Praxis      Cognition Arousal/Alertness: Awake/alert Behavior During Therapy: WFL for tasks assessed/performed Overall Cognitive Status: Within Functional Limits for tasks assessed                                          Exercises Other Exercises Other Exercises: Reiterated PROM and hand, elbow,wrist exercises. Performed each x 10.    Shoulder Instructions  General Comments      Pertinent Vitals/ Pain       Pain Assessment Pain Assessment: Faces Faces Pain Scale: Hurts a little bit Pain Location: L shoulder Pain Descriptors / Indicators: Aching, Throbbing, Grimacing Pain Intervention(s): Limited activity within patient's tolerance  Home Living                                          Prior Functioning/Environment              Frequency  Min 2X/week        Progress Toward Goals  OT Goals(current goals can now be found in the care plan section)  Progress towards OT goals: Progressing toward goals  Acute Rehab OT Goals Patient Stated Goal: not mess shoulder up OT Goal Formulation: With patient Time For Goal Achievement: 11/02/21 Potential to Achieve Goals: Good  Plan Discharge plan remains appropriate    Co-evaluation                 AM-PAC OT "6 Clicks" Daily Activity      Outcome Measure   Help from another person eating meals?: A Little Help from another person taking care of personal grooming?: A Little Help from another person toileting, which includes using toliet, bedpan, or urinal?: A Little Help from another person bathing (including washing, rinsing, drying)?: A Little Help from another person to put on and taking off regular upper body clothing?: A Little Help from another person to put on and taking off regular lower body clothing?: A Little 6 Click Score: 18    End of Session    OT Visit Diagnosis: Muscle weakness (generalized) (M62.81);Pain Pain - Right/Left: Left Pain - part of body: Shoulder   Activity Tolerance Patient tolerated treatment well   Patient Left in chair;with call bell/phone within reach;with family/visitor present   Nurse Communication  (OT education complete)        Time: 3086-5784 OT Time Calculation (min): 29 min  Charges: OT General Charges $OT Visit: 1 Visit OT Treatments $Self Care/Home Management : 8-22 mins $Therapeutic Exercise: 8-22 mins  Shameeka Silliman, OTR/L [place]  Office [phone] Pager: [place] 10/26/2021, 10:00 AM

## 2021-10-26 NOTE — Plan of Care (Signed)
  Problem: Coping: Goal: Level of anxiety will decrease Outcome: Progressing   Problem: Pain Managment: Goal: General experience of comfort will improve Outcome: Progressing   

## 2021-10-26 NOTE — Plan of Care (Signed)
  Problem: Education: Goal: Knowledge of General Education information will improve Description: Including pain rating scale, medication(s)/side effects and non-pharmacologic comfort measures Outcome: Progressing   Problem: Activity: Goal: Risk for activity intolerance will decrease Outcome: Progressing   Problem: Pain Managment: Goal: General experience of comfort will improve Outcome: Progressing   

## 2021-10-26 NOTE — Progress Notes (Signed)
Patient discharge complete. Awaiting ride.

## 2021-10-27 ENCOUNTER — Encounter (HOSPITAL_COMMUNITY): Payer: Self-pay | Admitting: Orthopedic Surgery

## 2021-10-31 DIAGNOSIS — Z7952 Long term (current) use of systemic steroids: Secondary | ICD-10-CM | POA: Diagnosis not present

## 2021-10-31 DIAGNOSIS — M810 Age-related osteoporosis without current pathological fracture: Secondary | ICD-10-CM | POA: Diagnosis not present

## 2021-10-31 DIAGNOSIS — M0609 Rheumatoid arthritis without rheumatoid factor, multiple sites: Secondary | ICD-10-CM | POA: Diagnosis not present

## 2021-10-31 DIAGNOSIS — Z79899 Other long term (current) drug therapy: Secondary | ICD-10-CM | POA: Diagnosis not present

## 2021-10-31 DIAGNOSIS — M1990 Unspecified osteoarthritis, unspecified site: Secondary | ICD-10-CM | POA: Diagnosis not present

## 2021-10-31 DIAGNOSIS — M25512 Pain in left shoulder: Secondary | ICD-10-CM | POA: Diagnosis not present

## 2021-10-31 DIAGNOSIS — M79643 Pain in unspecified hand: Secondary | ICD-10-CM | POA: Diagnosis not present

## 2021-11-04 DIAGNOSIS — Z4789 Encounter for other orthopedic aftercare: Secondary | ICD-10-CM | POA: Diagnosis not present

## 2021-11-13 DIAGNOSIS — M810 Age-related osteoporosis without current pathological fracture: Secondary | ICD-10-CM | POA: Diagnosis not present

## 2021-11-18 DIAGNOSIS — R5383 Other fatigue: Secondary | ICD-10-CM | POA: Diagnosis not present

## 2021-11-18 DIAGNOSIS — C3490 Malignant neoplasm of unspecified part of unspecified bronchus or lung: Secondary | ICD-10-CM | POA: Diagnosis not present

## 2021-11-18 DIAGNOSIS — Z0000 Encounter for general adult medical examination without abnormal findings: Secondary | ICD-10-CM | POA: Diagnosis not present

## 2021-11-18 DIAGNOSIS — J449 Chronic obstructive pulmonary disease, unspecified: Secondary | ICD-10-CM | POA: Diagnosis not present

## 2021-11-18 DIAGNOSIS — K219 Gastro-esophageal reflux disease without esophagitis: Secondary | ICD-10-CM | POA: Diagnosis not present

## 2021-11-18 DIAGNOSIS — Z1212 Encounter for screening for malignant neoplasm of rectum: Secondary | ICD-10-CM | POA: Diagnosis not present

## 2021-11-18 DIAGNOSIS — R7303 Prediabetes: Secondary | ICD-10-CM | POA: Diagnosis not present

## 2021-11-18 DIAGNOSIS — I10 Essential (primary) hypertension: Secondary | ICD-10-CM | POA: Diagnosis not present

## 2021-11-18 DIAGNOSIS — E559 Vitamin D deficiency, unspecified: Secondary | ICD-10-CM | POA: Diagnosis not present

## 2021-11-18 DIAGNOSIS — Z01419 Encounter for gynecological examination (general) (routine) without abnormal findings: Secondary | ICD-10-CM | POA: Diagnosis not present

## 2021-11-18 DIAGNOSIS — D649 Anemia, unspecified: Secondary | ICD-10-CM | POA: Diagnosis not present

## 2021-11-18 DIAGNOSIS — I700 Atherosclerosis of aorta: Secondary | ICD-10-CM | POA: Diagnosis not present

## 2021-11-18 DIAGNOSIS — Z86010 Personal history of colonic polyps: Secondary | ICD-10-CM | POA: Diagnosis not present

## 2021-12-02 DIAGNOSIS — Z4789 Encounter for other orthopedic aftercare: Secondary | ICD-10-CM | POA: Diagnosis not present

## 2021-12-30 DIAGNOSIS — M25512 Pain in left shoulder: Secondary | ICD-10-CM | POA: Diagnosis not present

## 2022-01-01 DIAGNOSIS — M25512 Pain in left shoulder: Secondary | ICD-10-CM | POA: Diagnosis not present

## 2022-01-05 DIAGNOSIS — M25512 Pain in left shoulder: Secondary | ICD-10-CM | POA: Diagnosis not present

## 2022-01-08 DIAGNOSIS — M25512 Pain in left shoulder: Secondary | ICD-10-CM | POA: Diagnosis not present

## 2022-01-12 DIAGNOSIS — M25512 Pain in left shoulder: Secondary | ICD-10-CM | POA: Diagnosis not present

## 2022-01-13 DIAGNOSIS — M25562 Pain in left knee: Secondary | ICD-10-CM | POA: Diagnosis not present

## 2022-01-13 DIAGNOSIS — Z4789 Encounter for other orthopedic aftercare: Secondary | ICD-10-CM | POA: Diagnosis not present

## 2022-01-15 DIAGNOSIS — M25512 Pain in left shoulder: Secondary | ICD-10-CM | POA: Diagnosis not present

## 2022-01-20 DIAGNOSIS — M25512 Pain in left shoulder: Secondary | ICD-10-CM | POA: Diagnosis not present

## 2022-01-23 DIAGNOSIS — M25512 Pain in left shoulder: Secondary | ICD-10-CM | POA: Diagnosis not present

## 2022-01-27 ENCOUNTER — Telehealth: Payer: Self-pay | Admitting: Internal Medicine

## 2022-01-27 DIAGNOSIS — M25512 Pain in left shoulder: Secondary | ICD-10-CM | POA: Diagnosis not present

## 2022-01-27 NOTE — Telephone Encounter (Signed)
Patient called to confirm July and August appointments, patient is aware.

## 2022-01-28 DIAGNOSIS — M1990 Unspecified osteoarthritis, unspecified site: Secondary | ICD-10-CM | POA: Diagnosis not present

## 2022-01-28 DIAGNOSIS — R739 Hyperglycemia, unspecified: Secondary | ICD-10-CM | POA: Diagnosis not present

## 2022-01-28 DIAGNOSIS — J449 Chronic obstructive pulmonary disease, unspecified: Secondary | ICD-10-CM | POA: Diagnosis not present

## 2022-01-28 DIAGNOSIS — I10 Essential (primary) hypertension: Secondary | ICD-10-CM | POA: Diagnosis not present

## 2022-02-10 DIAGNOSIS — M170 Bilateral primary osteoarthritis of knee: Secondary | ICD-10-CM | POA: Diagnosis not present

## 2022-02-19 DIAGNOSIS — Z01818 Encounter for other preprocedural examination: Secondary | ICD-10-CM | POA: Diagnosis not present

## 2022-02-19 DIAGNOSIS — R739 Hyperglycemia, unspecified: Secondary | ICD-10-CM | POA: Diagnosis not present

## 2022-02-26 DIAGNOSIS — R7303 Prediabetes: Secondary | ICD-10-CM | POA: Diagnosis not present

## 2022-02-26 DIAGNOSIS — M1990 Unspecified osteoarthritis, unspecified site: Secondary | ICD-10-CM | POA: Diagnosis not present

## 2022-02-26 DIAGNOSIS — Z01818 Encounter for other preprocedural examination: Secondary | ICD-10-CM | POA: Diagnosis not present

## 2022-03-04 ENCOUNTER — Telehealth: Payer: Self-pay | Admitting: Primary Care

## 2022-03-04 NOTE — Telephone Encounter (Signed)
Fax received from Dr. Rod Can with [facility] to perform a Left total knee arthroplasty on patient.  Patient needs surgery clearance. Surgery is pending. Patient was seen on 09/18/2021. Office protocol is a risk assessment can be sent to surgeon if patient has been seen in 60 days or less.   Sending to [facility] and Dr. Lamonte Sakai for risk assessment or recommendations if patient needs to be seen in office prior to surgical procedure.

## 2022-03-05 NOTE — Telephone Encounter (Signed)
Next Appt With Pulmonology Martyn Ehrich, NP) 03/16/2022 at 12:00 PM

## 2022-03-05 NOTE — Telephone Encounter (Signed)
Yes will need office visit for surgical risk assessment

## 2022-03-16 ENCOUNTER — Encounter: Payer: Self-pay | Admitting: Primary Care

## 2022-03-16 ENCOUNTER — Other Ambulatory Visit: Payer: Self-pay | Admitting: Primary Care

## 2022-03-16 ENCOUNTER — Ambulatory Visit (INDEPENDENT_AMBULATORY_CARE_PROVIDER_SITE_OTHER): Payer: No Typology Code available for payment source | Admitting: Primary Care

## 2022-03-16 ENCOUNTER — Ambulatory Visit (INDEPENDENT_AMBULATORY_CARE_PROVIDER_SITE_OTHER): Payer: No Typology Code available for payment source

## 2022-03-16 VITALS — BP 128/88 | HR 83 | Temp 99.0°F | Ht 63.0 in | Wt 156.0 lb

## 2022-03-16 DIAGNOSIS — J449 Chronic obstructive pulmonary disease, unspecified: Secondary | ICD-10-CM

## 2022-03-16 DIAGNOSIS — M069 Rheumatoid arthritis, unspecified: Secondary | ICD-10-CM | POA: Diagnosis not present

## 2022-03-16 DIAGNOSIS — Z01811 Encounter for preprocedural respiratory examination: Secondary | ICD-10-CM

## 2022-03-16 DIAGNOSIS — C3491 Malignant neoplasm of unspecified part of right bronchus or lung: Secondary | ICD-10-CM | POA: Diagnosis not present

## 2022-03-16 DIAGNOSIS — Z01818 Encounter for other preprocedural examination: Secondary | ICD-10-CM | POA: Diagnosis not present

## 2022-03-16 MED ORDER — TIOTROPIUM BROMIDE-OLODATEROL 2.5-2.5 MCG/ACT IN AERS: 2.0000 | INHALATION_SPRAY | Freq: Every day | RESPIRATORY_TRACT | 6 refills | Status: DC

## 2022-03-16 NOTE — Telephone Encounter (Signed)
Yes, ok to change from Stiolto to [employer]. Please let patient know. She should change from Stiolto (green inhaler) to Anoro (red inhaler)- take one puff daily.

## 2022-03-16 NOTE — Telephone Encounter (Signed)
Called and spoke with pt letting her know the info about her inhalers and she verbalized understanding. Nothing further needed.

## 2022-03-16 NOTE — Assessment & Plan Note (Addendum)
-   Patient is planning for left knee replacement at [facility] with Dr. Rod Can, date to be determined.  Patient is considered high risk for prolonged mechanical ventilation and/or postop pulmonary complications based on SEVERE COPD (FEV1 35% predicted).  Her exam today was benign, lungs were clear to auscultation.  She has no acute respiratory symptoms or recent exacerbations.  O2 remains able on room air.  She does not require supplemental oxygen.  From a pulmonary standpoint she is optimized for surgery, ultimate clearance will be decided upon by anesthesiologist and surgeon.  Patient had an office spirometry today that showed stable lung function when compared to August 2022 pulmonary function testing.  We will check preop chest x-ray today.

## 2022-03-16 NOTE — Patient Instructions (Addendum)
Recommendations: - Take Stiolto Respimat (green) 2 puff every day in the morning - Use Albuterol inhaler 2 puffs every 4-6 hours AS NEEDED for breakthrough shortness of breath/wheezing  - You need to call Dr. Julien Nordmann office, you were last seen in January 2023 and he wanted to see you back in 6 months (due around July 31st)  Orders: - In office spirometry re: COPD - CXR today re: pre-op clearance   Follow-up: - 4-6 months with Dr. Lamonte Sakai

## 2022-03-16 NOTE — Progress Notes (Signed)
@Patient  ID: Heather Ellis, female    DOB: [birth year], 76 y.o.   MRN: [id number]  Chief Complaint  Patient presents with   Follow-up    Pt needs surgical clearance. Pt is still having SOB with exertion.     Referring provider: [name], MD  HPI: 76 year old female, former smoked. PMH significant for asthma with COPD and adenocarcinoma right lung. Patient of Dr. Lamonte Sakai.   Previous [facility] pulmonary encounter: 09/18/2021 Patient presents today for surgical clearance. Planning for left rotator cuff surgery with Dr. Stann Mainland on February 24th. Heather Ellis has hx asthma/COPD and adenocarcinoma right lung. Heather Ellis has some mild exertional dyspnea and a dry cough at baseline. No mucus production. Heather Ellis is not regularly using Heather Ellis Stiolto inhaler. PFTs in August 2022 showed very severe airflow obstruction/ FEV1 36%. Heather Ellis finished radiation in December 2022. Following with Dr. Julien Nordmann with oncology.   03/16/2022 Patient presents today for surgical risk assessment. Medical history significant for lung cancer, asthma with COPD and RA on methotrexate (no longer on prednisone). Hx end-stage osteoarthritis left knee. Planning for left knee replacement with Dr. Rod Can with [facility]. Plan [facility] stay at [facility] long. Post-op apixcabn for DVT prophylaxis.  Heather Ellis had left rotator cuff shoulder surgery in February 2023 and did well with this. Heather Ellis is still doing therapy at home.  Overall Heather Ellis is doing well without acute respiratory complaints. Heather Ellis has chronic exertional dyspnea which is worse in heat/humidity. Heather Ellis has a rare dry cough. Uses Stiolto only as needed. Heather Ellis has some confusion over Heather Ellis inhalers. We reviewed proper use today. PFTs in August 2022 showed FEV 36%.   Allergies  Allergen Reactions   Fosamax [Alendronate Sodium] Nausea And Vomiting    Immunization History  Administered Date(s) Administered   Influenza, High Dose Seasonal PF 08/06/2016, 05/22/2017, 05/18/2018   Influenza, Quadrivalent,  Recombinant, Inj, Pf 06/18/2021   Moderna Sars-Covid-2 Vaccination 09/13/2019, 10/11/2019, 12/04/2020   Pneumococcal Conjugate-13 08/16/2019   Tdap 12/19/2010    Past Medical History:  Diagnosis Date   Acid reflux    Anemia    Asthma    Cancer ([place])    Right lung   COPD (chronic obstructive pulmonary disease) (HCC)    Dyspnea    WITH EXERTION   H/O polymyalgia rheumatica    History of COVID-19 2020   Hypertension    OA (osteoarthritis)    Obesity    Osteoporosis    Pre-diabetes    Syncope    Syncope and collapse 08/2018   Vitamin D deficiency     Tobacco History: Social History   Tobacco Use  Smoking Status Former   Types: Cigarettes   Quit date: 10/19/2014   Years since quitting: 7.4  Smokeless Tobacco Never   Counseling given: Not Answered   Outpatient Medications Prior to Visit  Medication Sig Dispense Refill   bisoprolol-hydrochlorothiazide (ZIAC) 5-6.25 MG tablet Take 2 tablets by mouth in the morning.     carboxymethylcellulose (REFRESH PLUS) 0.5 % SOLN Place 1 drop into both eyes 3 (three) times daily as needed (dry eyes).     cholecalciferol (VITAMIN D) 1000 UNITS tablet Take 1,000 Units by mouth daily in the afternoon.     folic acid (FOLVITE) 1 MG tablet Take 1 mg by mouth daily.     HYDROcodone-acetaminophen (NORCO) 7.5-325 MG tablet Take 1 tablet by mouth every 6 (six) hours as needed for moderate pain. 24 tablet 0   lisinopril (ZESTRIL) 40 MG tablet Take 40 mg by mouth  daily.     Menthol, Topical Analgesic, (BIOFREEZE EX) Apply 1 application topically daily as needed (soreness).     methotrexate 2.5 MG tablet Take 20 mg by mouth every Monday.     omeprazole (PRILOSEC) 20 MG capsule Take 20 mg by mouth daily as needed (acid reflux/heartburn).     ondansetron (ZOFRAN) 4 MG tablet Take 1 tablet (4 mg total) by mouth daily as needed for nausea or vomiting. 20 tablet 1   predniSONE (DELTASONE) 5 MG tablet Take 5 mg by mouth every other day.     Tiotropium  Bromide-Olodaterol (STIOLTO RESPIMAT) 2.5-2.5 MCG/ACT AERS Inhale 2 puffs into the lungs daily. (Patient taking differently: Inhale 2 puffs into the lungs daily as needed (shortness of breath).) 4 g 0   No facility-administered medications prior to visit.    Review of Systems  Review of Systems  Constitutional: Negative.   HENT: Negative.    Respiratory: Negative.    Cardiovascular: Negative.     Physical Exam  BP 128/88 (BP Location: Right Arm, Patient Position: Sitting, Cuff Size: Normal)   Pulse 83   Temp 99 F (37.2 C) (Oral)   Ht 5\' 3"  (1.6 m)   Wt 156 lb (70.8 kg)   SpO2 97%   BMI 27.63 kg/m  Physical Exam Constitutional:      Appearance: Normal appearance.  HENT:     Head: Normocephalic and atraumatic.     Mouth/Throat:     Mouth: Mucous membranes are moist.     Pharynx: Oropharynx is clear.  Cardiovascular:     Rate and Rhythm: Normal rate and regular rhythm.  Pulmonary:     Effort: Pulmonary effort is normal.     Breath sounds: Normal breath sounds. No wheezing, rhonchi or rales.  Musculoskeletal:        General: Normal range of motion.     Cervical back: Normal range of motion and neck supple.  Skin:    General: Skin is warm and dry.  Neurological:     General: No focal deficit present.     Mental Status: Heather Ellis is alert and oriented to person, place, and time. Mental status is at baseline.  Psychiatric:        Mood and Affect: Mood normal.        Thought Content: Thought content normal.        Judgment: Judgment normal.      Lab Results:  CBC    Component Value Date/Time   WBC 4.1 10/10/2021 0943   RBC 3.79 (L) 10/10/2021 0943   HGB 11.3 (L) 10/10/2021 0943   HGB 10.8 (L) 05/29/2021 1348   HCT 36.8 10/10/2021 0943   PLT 216 10/10/2021 0943   PLT 228 05/29/2021 1348   MCV 97.1 10/10/2021 0943   MCH 29.8 10/10/2021 0943   MCHC 30.7 10/10/2021 0943   RDW 13.7 10/10/2021 0943   LYMPHSABS 1.8 05/29/2021 1348   MONOABS 0.4 05/29/2021 1348    EOSABS 0.1 05/29/2021 1348   BASOSABS 0.0 05/29/2021 1348    BMET    Component Value Date/Time   NA 138 10/10/2021 0943   NA 146 (H) 10/26/2018 0816   K 4.1 10/10/2021 0943   CL 103 10/10/2021 0943   CO2 30 10/10/2021 0943   GLUCOSE 98 10/10/2021 0943   BUN 16 10/10/2021 0943   BUN 17 10/26/2018 0816   CREATININE 0.71 10/10/2021 0943   CREATININE 0.70 05/29/2021 1348   CALCIUM 9.4 10/10/2021 0943   GFRNONAA >60 10/10/2021  [place] 05/29/2021 1348   GFRAA >60 01/05/2020 1259    BNP No results found for: "BNP"  ProBNP No results found for: "PROBNP"  Imaging: No results found.   Assessment & Plan:   COPD, very severe ([place]) - Stable; Heather Ellis has very severe obstructive lung disease. Spirometry in office today showed stable lung function when compared to August 2022/ FEV1 0.5 L (35% predicted).  - No acute respiratory complaints. Chronic exertional dyspnea, rare dry cough. Using [employer] as needed only, reinforced importance of using daily as maintenance regimen.  - Continue Stiolto Respimat 2 puff daily in the morning and Albuterol HFA 2 puffs every 4-6 hours as needed for breakthrough shortness of breath/wheezing.  - FU in 4 months with Dr. Lamonte Sakai or sooner if needed   Pre-operative respiratory examination - Patient is planning for left knee replacement at [facility] with Dr. Rod Can, date to be determined.  Patient is considered high risk for prolonged mechanical ventilation and/or postop pulmonary complications based on SEVERE COPD (FEV1 35% predicted).  Heather Ellis exam today was benign, lungs were clear to auscultation.  Heather Ellis has no acute respiratory symptoms or recent exacerbations.  O2 remains able on room air.  Heather Ellis does not require supplemental oxygen.  From a pulmonary standpoint Heather Ellis is optimized for surgery, ultimate clearance will be decided upon by anesthesiologist and surgeon.  Patient had an office spirometry today that showed stable lung function when compared  to August 2022 pulmonary function testing.  We will check preop chest x-ray today.  Rheumatoid arthritis ([place]) - Continue methotrexate 20mg  weekly, no longer on oral prednisone      1) RISK FOR PROLONGED MECHANICAL VENTILAION - > 48h  1A) Arozullah - Prolonged mech ventilation risk Arozullah Postperative Pulmonary Risk Score - for mech ventilation dependence >48h [employer], Ann Surg 2000, major non-cardiac surgery) Comment Score  Type of surgery - abd ao aneurysm (27), thoracic (21), neurosurgery / upper abdominal / vascular (21), neck (11) Left hip replacement 6  Emergency Surgery - (11)  0  ALbumin < 3 or poor nutritional state - (9)  0  BUN > 30 -  (8)  0  Partial or completely dependent functional status - (7)  0  COPD -  (6)  6  Age - 60 to 69 (4), > 70  (6)  6  TOTAL  18  Risk Stratifcation scores  - < 10 (0.5%), 11-19 (1.8%), 20-27 (4.2%), 28-40 (10.1%), >40 (26.6%)  1.8 % risk prolonged mech ventilation      1B) GUPTA - Prolonged Mech Vent Risk Score source Risk  Guptal post op prolonged mech ventilation > 48h or reintubation < 30 days - ACS 2007-2008 dataset - http://lewis-perez.info/ 1.0 % Risk of mechanical ventilation for >48 hrs after surgery, or unplanned intubation ?30 days of surgery    2) RISK FOR POST OP PNEUMONIA Score source Risk  Lyndel Safe - Post Op Pnemounia risk  [email or web address] 1.8 % Risk of postoperative pneumonia    R3) ISK FOR ANY POST-OP PULMONARY COMPLICATION Score source Risk  CANET/ARISCAT Score - risk for ANY/ALl pulmonary complications - > risk of in-hospital post-op pulmonary complications (composite including respiratory failure, respiratory infection, pleural effusion, atelectasis, pneumothorax, bronchospasm, aspiration pneumonitis) [employer] - based on age, anemia, pulse ox, resp infection  prior 30d, incision site, duration of surgery, and emergency v elective surgery Low risk 1.6% risk of in-hospital post-op pulmonary complications (composite including respiratory failure, respiratory infection, pleural effusion, atelectasis, pneumothorax, bronchospasm, aspiration  pneumonitis)   40 mins spent on case: >50% face to face with patient   Martyn Ehrich, NP 03/16/2022

## 2022-03-16 NOTE — Telephone Encounter (Signed)
Changes Requested   ANORO ELLIPTA 62.5-25 MCG/ACT AEPB       Changed from: Tiotropium Bromide-Olodaterol 2.5-2.5 MCG/ACT AERS   All pharmacy suggested alternatives are listed below   Sig: Inhale 2 puffs into the lungs daily.   Disp:  Not specified    Refills:  0   Start: 03/16/2022   Class: Normal   Last ordered: Today (03/16/2022) by Martyn Ehrich, NP   Last refill: 03/16/2022   Rx #: [id number]   Pharmacy comment: Alternative Requested:NOT COVERED.  All Pharmacy Suggested Alternatives:   umeclidinium-vilanterol (ANORO ELLIPTA) 62.5-25 MCG/ACT AEPB Glycopyrrolate-Formoterol (BEVESPI AEROSPHERE) 9-4.8 MCG/ACT AERO  To prescribe one of the alternatives listed above, open the encounter and click Replace.  Open Encounter     To be filled at: [id number] - [place], [place], please advise on inhaler change.

## 2022-03-16 NOTE — Assessment & Plan Note (Addendum)
-   Stable; She has very severe obstructive lung disease. Spirometry in office today showed stable lung function when compared to August 2022/ FEV1 0.5 L (35% predicted).  - No acute respiratory complaints. Chronic exertional dyspnea, rare dry cough. Using [employer] as needed only, reinforced importance of using daily as maintenance regimen.  - Continue Stiolto Respimat 2 puff daily in the morning and Albuterol HFA 2 puffs every 4-6 hours as needed for breakthrough shortness of breath/wheezing.  - FU in 4 months with Dr. Lamonte Sakai or sooner if needed

## 2022-03-16 NOTE — Assessment & Plan Note (Signed)
-   Continue methotrexate 20mg  weekly, no longer on oral prednisone

## 2022-03-17 DIAGNOSIS — Z96612 Presence of left artificial shoulder joint: Secondary | ICD-10-CM | POA: Diagnosis not present

## 2022-03-17 NOTE — Progress Notes (Signed)
Pre-op CXR is stable, no acute cardiopulmonary process. Fiduciary marker in RUL noted, left lung is clear.

## 2022-03-17 NOTE — Telephone Encounter (Signed)
OV notes and clearance form have been faxed back to [facility]. Nothing further needed at this time. ?

## 2022-03-20 ENCOUNTER — Ambulatory Visit: Payer: Self-pay | Admitting: Student

## 2022-03-26 ENCOUNTER — Other Ambulatory Visit: Payer: Self-pay

## 2022-03-26 ENCOUNTER — Inpatient Hospital Stay: Payer: No Typology Code available for payment source | Attending: Internal Medicine

## 2022-03-26 ENCOUNTER — Ambulatory Visit (HOSPITAL_COMMUNITY)
Admission: RE | Admit: 2022-03-26 | Discharge: 2022-03-26 | Disposition: A | Discharge status: Home / Self Care | Payer: No Typology Code available for payment source | Source: Ambulatory Visit | Attending: Internal Medicine | Admitting: Internal Medicine

## 2022-03-26 DIAGNOSIS — Z85118 Personal history of other malignant neoplasm of bronchus and lung: Secondary | ICD-10-CM | POA: Diagnosis present

## 2022-03-26 DIAGNOSIS — C3490 Malignant neoplasm of unspecified part of unspecified bronchus or lung: Secondary | ICD-10-CM

## 2022-03-26 DIAGNOSIS — I10 Essential (primary) hypertension: Secondary | ICD-10-CM | POA: Diagnosis not present

## 2022-03-26 DIAGNOSIS — Z79899 Other long term (current) drug therapy: Secondary | ICD-10-CM | POA: Diagnosis not present

## 2022-03-26 LAB — CBC WITH DIFFERENTIAL (CANCER CENTER ONLY)
Abs Immature Granulocytes: 0.02 10*3/uL (ref 0.00–0.07)
Basophils Absolute: 0.1 10*3/uL (ref 0.0–0.1)
Basophils Relative: 1 %
Eosinophils Absolute: 0.1 10*3/uL (ref 0.0–0.5)
Eosinophils Relative: 1 %
HCT: 33.5 % — ABNORMAL LOW (ref 36.0–46.0)
Hemoglobin: 10.7 g/dL — ABNORMAL LOW (ref 12.0–15.0)
Immature Granulocytes: 0 %
Lymphocytes Relative: 24 %
Lymphs Abs: 1.1 10*3/uL (ref 0.7–4.0)
MCH: 29.6 pg (ref 26.0–34.0)
MCHC: 31.9 g/dL (ref 30.0–36.0)
MCV: 92.8 fL (ref 80.0–100.0)
Monocytes Absolute: 0.4 10*3/uL (ref 0.1–1.0)
Monocytes Relative: 8 %
Neutro Abs: 3 10*3/uL (ref 1.7–7.7)
Neutrophils Relative %: 66 %
Platelet Count: 197 10*3/uL (ref 150–400)
RBC: 3.61 MIL/uL — ABNORMAL LOW (ref 3.87–5.11)
RDW: 14.9 % (ref 11.5–15.5)
WBC Count: 4.6 10*3/uL (ref 4.0–10.5)
nRBC: 0 % (ref 0.0–0.2)

## 2022-03-26 LAB — CMP (CANCER CENTER ONLY)
ALT: 7 U/L (ref 0–44)
AST: 14 U/L — ABNORMAL LOW (ref 15–41)
Albumin: 3.9 g/dL (ref 3.5–5.0)
Alkaline Phosphatase: 86 U/L (ref 38–126)
Anion gap: 5 (ref 5–15)
BUN: 9 mg/dL (ref 8–23)
CO2: 33 mmol/L — ABNORMAL HIGH (ref 22–32)
Calcium: 9.3 mg/dL (ref 8.9–10.3)
Chloride: 105 mmol/L (ref 98–111)
Creatinine: 0.65 mg/dL (ref 0.44–1.00)
GFR, Estimated: >60 mL/min (ref >60)
Glucose, Bld: 110 mg/dL — ABNORMAL HIGH (ref 70–99)
Potassium: 3.9 mmol/L (ref 3.5–5.1)
Sodium: 143 mmol/L (ref 135–145)
Total Bilirubin: 0.5 mg/dL (ref 0.3–1.2)
Total Protein: 7.3 g/dL (ref 6.5–8.1)

## 2022-03-26 MED ORDER — IOHEXOL 300 MG/ML  SOLN
75.0000 mL | Freq: Once | INTRAMUSCULAR | Status: AC | PRN
  Administered 2022-03-26: 75 mL via INTRAVENOUS

## 2022-04-02 ENCOUNTER — Encounter: Payer: Self-pay | Admitting: Internal Medicine

## 2022-04-02 ENCOUNTER — Inpatient Hospital Stay: Payer: No Typology Code available for payment source | Attending: Internal Medicine | Admitting: Internal Medicine

## 2022-04-02 ENCOUNTER — Other Ambulatory Visit: Payer: Self-pay

## 2022-04-02 ENCOUNTER — Inpatient Hospital Stay: Payer: No Typology Code available for payment source

## 2022-04-02 VITALS — BP 190/80 | HR 86 | Temp 98.3°F | Resp 16 | Wt 157.6 lb

## 2022-04-02 DIAGNOSIS — I10 Essential (primary) hypertension: Secondary | ICD-10-CM | POA: Diagnosis not present

## 2022-04-02 DIAGNOSIS — C3491 Malignant neoplasm of unspecified part of right bronchus or lung: Secondary | ICD-10-CM | POA: Diagnosis not present

## 2022-04-02 DIAGNOSIS — C3411 Malignant neoplasm of upper lobe, right bronchus or lung: Secondary | ICD-10-CM

## 2022-04-02 DIAGNOSIS — C3490 Malignant neoplasm of unspecified part of unspecified bronchus or lung: Secondary | ICD-10-CM

## 2022-04-02 DIAGNOSIS — Z79899 Other long term (current) drug therapy: Secondary | ICD-10-CM | POA: Diagnosis not present

## 2022-04-02 DIAGNOSIS — Z85118 Personal history of other malignant neoplasm of bronchus and lung: Secondary | ICD-10-CM | POA: Insufficient documentation

## 2022-04-02 DIAGNOSIS — J449 Chronic obstructive pulmonary disease, unspecified: Secondary | ICD-10-CM | POA: Insufficient documentation

## 2022-04-02 MED ORDER — CLONIDINE HCL 0.1 MG PO TABS
0.2000 mg | ORAL_TABLET | Freq: Once | ORAL | Status: AC
  Administered 2022-04-02: 0.2 mg via ORAL
  Filled 2022-04-02: qty 2

## 2022-04-02 NOTE — Progress Notes (Signed)
Hypertension-  pts BP over 200 /100 . She did not take her Ziac or Lisinopril  and we gave her Clonidine 0.2 mg. BP came down to 190/80. Pt instructed to go home and take BP medicine and contact Dr Pennie Banter office.   I contacted Dr Shelia [employer] and [facility] Prov[facility]'s office and reported the above. His office will f/u.

## 2022-04-02 NOTE — Progress Notes (Signed)
Heather Ellis [phone]   Fax:(336) [place], [place] [address] [place] [address]  DIAGNOSIS: Stage IA (T1b, N0, M0) non-small cell lung cancer, adenocarcinoma presented with right upper lobe lung nodule diagnosed in September 2022.  PRIOR THERAPY: Status post SBRT to the right upper lobe lung nodule under the care of Dr. Lisbeth Renshaw completed July 09, 2021.  CURRENT THERAPY: Observation.  INTERVAL HISTORY: Heather Ellis 76 y.o. female returns to the clinic today for 6 months follow-up visit.  The patient is feeling fine today with no concerning complaints except for intermittent pain on the right side of the chest under her right breast.  She has been taking Tylenol with some minimal improvement.  She has a history of old rib fracture in that area.  She denied having any shortness of breath, cough or hemoptysis.  She has no nausea, vomiting, diarrhea or constipation.  She has no headache or visual changes.  She did not take her blood pressure medication earlier today.  The patient is here today for evaluation with repeat CT scan of the chest for restaging of her disease.  MEDICAL HISTORY: Past Medical History:  Diagnosis Date   Acid reflux    Anemia    Asthma    Cancer ([place])    Right lung   COPD (chronic obstructive pulmonary disease) (HCC)    Dyspnea    WITH EXERTION   H/O polymyalgia rheumatica    History of COVID-19 2020   Hypertension    OA (osteoarthritis)    Obesity    Osteoporosis    Pre-diabetes    Syncope    Syncope and collapse 08/2018   Vitamin D deficiency     ALLERGIES:  is allergic to fosamax [alendronate sodium].  MEDICATIONS:  Current Outpatient Medications  Medication Sig Dispense Refill   bisoprolol-hydrochlorothiazide (ZIAC) 5-6.25 MG tablet Take 2 tablets by mouth in the morning.     carboxymethylcellulose (REFRESH PLUS) 0.5 % SOLN Place 1 drop into both eyes  3 (three) times daily as needed (dry eyes).     cholecalciferol (VITAMIN D) 1000 UNITS tablet Take 1,000 Units by mouth daily in the afternoon.     folic acid (FOLVITE) 1 MG tablet Take 1 mg by mouth daily.     HYDROcodone-acetaminophen (NORCO) 7.5-325 MG tablet Take 1 tablet by mouth every 6 (six) hours as needed for moderate pain. 24 tablet 0   lisinopril (ZESTRIL) 40 MG tablet Take 40 mg by mouth daily.     Menthol, Topical Analgesic, (BIOFREEZE EX) Apply 1 application topically daily as needed (soreness).     methotrexate 2.5 MG tablet Take 20 mg by mouth every Monday.     omeprazole (PRILOSEC) 20 MG capsule Take 20 mg by mouth daily as needed (acid reflux/heartburn).     ondansetron (ZOFRAN) 4 MG tablet Take 1 tablet (4 mg total) by mouth daily as needed for nausea or vomiting. 20 tablet 1   umeclidinium-vilanterol (ANORO ELLIPTA) 62.5-25 MCG/ACT AEPB Inhale 1 puff into the lungs daily. 60 each 3   No current facility-administered medications for this visit.    SURGICAL HISTORY:  Past Surgical History:  Procedure Laterality Date   ABDOMINAL HYSTERECTOMY     TOTAL ABDOMINAL HYSTERECTOMY    BRONCHIAL BIOPSY  05/12/2021   Procedure: BRONCHIAL BIOPSIES;  Surgeon: Collene Gobble, MD;  Location: [facility] ENDOSCOPY;  Service: Pulmonary;;   BRONCHIAL BRUSHINGS  05/12/2021  Procedure: BRONCHIAL BRUSHINGS;  Surgeon: Collene Gobble, MD;  Location: [facility] ENDOSCOPY;  Service: Pulmonary;;   BRONCHIAL NEEDLE ASPIRATION BIOPSY  05/12/2021   Procedure: BRONCHIAL NEEDLE ASPIRATION BIOPSIES;  Surgeon: Collene Gobble, MD;  Location: [facility] ENDOSCOPY;  Service: Pulmonary;;   COLONOSCOPY W/ BIOPSIES  2015   ESOPHAGOGASTRODUODENOSCOPY     FIDUCIAL MARKER PLACEMENT  05/12/2021   Procedure: FIDUCIAL MARKER PLACEMENT;  Surgeon: Collene Gobble, MD;  Location: [facility] ENDOSCOPY;  Service: Pulmonary;;   OOPHORECTOMY     BSO   REVERSE SHOULDER ARTHROPLASTY Left 10/24/2021   Procedure: REVERSE SHOULDER ARTHROPLASTY;  Surgeon:  Nicholes Stairs, MD;  Location: [facility] ORS;  Service: Orthopedics;  Laterality: Left;   TOTAL HIP ARTHROPLASTY Left 01/16/2020   Procedure: TOTAL HIP ARTHROPLASTY ANTERIOR APPROACH;  Surgeon: Renette Butters, MD;  Location: [facility] ORS;  Service: Orthopedics;  Laterality: Left;   VIDEO BRONCHOSCOPY WITH ENDOBRONCHIAL NAVIGATION Right 05/12/2021   Procedure: ROBOTIC ASSISTED BRONCHOSCOPY WITH ENDOBRONCHIAL NAVIGATION;  Surgeon: Collene Gobble, MD;  Location: [facility] ENDOSCOPY;  Service: Pulmonary;  Laterality: Right;   VIDEO BRONCHOSCOPY WITH RADIAL ENDOBRONCHIAL ULTRASOUND  05/12/2021   Procedure: VIDEO BRONCHOSCOPY WITH RADIAL ENDOBRONCHIAL ULTRASOUND;  Surgeon: Collene Gobble, MD;  Location: [facility] ENDOSCOPY;  Service: Pulmonary;;    REVIEW OF SYSTEMS:  A comprehensive review of systems was negative except for: Respiratory: positive for pleurisy/chest pain   PHYSICAL EXAMINATION: General appearance: alert, cooperative, and no distress Head: Normocephalic, without obvious abnormality, atraumatic Neck: no adenopathy, no JVD, supple, symmetrical, trachea midline, and thyroid not enlarged, symmetric, no tenderness/mass/nodules Lymph nodes: Cervical, supraclavicular, and axillary nodes normal. Resp: clear to auscultation bilaterally Back: symmetric, no curvature. ROM normal. No CVA tenderness. Cardio: regular rate and rhythm, S1, S2 normal, no murmur, click, rub or gallop GI: soft, non-tender; bowel sounds normal; no masses,  no organomegaly Extremities: extremities normal, atraumatic, no cyanosis or edema  ECOG PERFORMANCE STATUS: 1 - Symptomatic but completely ambulatory  Blood pressure (!) 190/80, pulse 86, temperature 98.3 F (36.8 C), temperature source Oral, resp. rate 16, weight 157 lb 9.6 oz (71.5 kg), SpO2 98 %.  LABORATORY DATA: Lab Results  Component Value Date   WBC 4.6 03/26/2022   HGB 10.7 (L) 03/26/2022   HCT 33.5 (L) 03/26/2022   MCV 92.8 03/26/2022   PLT 197 03/26/2022       Chemistry      Component Value Date/Time   NA 143 03/26/2022 1127   NA 146 (H) 10/26/2018 0816   K 3.9 03/26/2022 1127   CL 105 03/26/2022 1127   CO2 33 (H) 03/26/2022 1127   BUN 9 03/26/2022 1127   BUN 17 10/26/2018 0816   CREATININE 0.65 03/26/2022 1127      Component Value Date/Time   CALCIUM 9.3 03/26/2022 1127   ALKPHOS 86 03/26/2022 1127   AST 14 (L) 03/26/2022 1127   ALT 7 03/26/2022 1127   BILITOT 0.5 03/26/2022 1127       RADIOGRAPHIC STUDIES: CT Chest W Contrast  Result Date: 03/26/2022 CLINICAL DATA:  Non-small cell lung cancer restaging * Tracking Code: BO * EXAM: CT CHEST WITH CONTRAST TECHNIQUE: Multidetector CT imaging of the chest was performed during intravenous contrast administration. RADIATION DOSE REDUCTION: This exam was performed according to the departmental dose-optimization program which includes automated exposure control, adjustment of the mA and/or kV according to patient size and/or use of iterative reconstruction technique. CONTRAST:  29mL OMNIPAQUE IOHEXOL 300 MG/ML  SOLN COMPARISON:  Multiple exams, including 09/22/2021  FINDINGS: Cardiovascular: Ascending thoracic aortic aneurysm stable at 4.2 cm in diameter, extending up into the proximal arch. Aortic and branch vessel atherosclerotic calcification. Mild mitral valve calcification. Mediastinum/Nodes: No significant thyroid lesion. Small mediastinal lymph nodes are not pathologically enlarged. Small type 1 hiatal hernia. Lungs/Pleura: The regional right upper lobe nodule is obscured by increasing surrounding bandlike airspace opacity involving both the right upper lobe and potentially a small portion of the superior segment right lower lobe. There is volume loss in the left upper lobe particularly along the major and minor fissures. Fiducial noted along the medial margin of this process at the level of the fiducial, the thickness of the adjacent density measures 1.5 cm, previously 1.3 cm. Strictly speaking I  cannot exclude enlargement of the underlying nodule although more likely the increasing density in this vicinity is related to radiation pneumonitis. Scattered faint nodularity in the upper lobes is unchanged from prior. 5 by 2 mm left lower lobe nodule on image 102 series 5 appears stable. Upper Abdomen: Unremarkable Musculoskeletal: Old right posterolateral rib deformities from healed fractures. Left glenoid hardware from shoulder replacement noted. Thoracic spondylosis. IMPRESSION: 1. Progressive bandlike density in the right upper lobe with associated volume loss. This density obscures the original nodule. The transverse thickness of tissues lateral to the fiducial marker is currently 1.5 cm in previously 1.3 cm. This difference is probably due to volume loss and adjacent inflammatory findings from radiation pneumonitis, continued surveillance suggested. 2. Stable faint nodularity in the upper lobes, likely benign but meriting surveillance. Stable 5 by 2 mm left lower lobe nodule. Ascending thoracic aortic aneurysm 4.2 cm in diameter. This might be followed in the context of the patient's follow up oncology imaging. Otherwise, recommend annual imaging followup by CTA or MRA. This recommendation follows 2010 ACCF/AHA/AATS/ACR/ASA/SCA/SCAI/SIR/STS/SVM Guidelines for the Diagnosis and Management of Patients with Thoracic Aortic Disease. Circulation. 2010; 121: X833-A250. Aortic aneurysm NOS (ICD10-I71.9) 3. Aortic Atherosclerosis (ICD10-I70.0). Mitral valve calcification. 4. Small type 1 hiatal hernia Electronically Signed   By: Van Clines M.D.   On: 03/26/2022 15:54   DG Chest 2 View  Result Date: 03/16/2022 CLINICAL DATA:  Preoperative clearance, history of COPD/asthma and lung cancer. EXAM: CHEST - 2 VIEW COMPARISON:  05/12/2021, 09/22/2021. FINDINGS: The heart is borderline enlarged the mediastinal contour stable. Atherosclerotic calcification of the aorta is noted. Parenchymal opacity and fiduciary  marker are noted in the right upper lobe. The left lung is clear. No effusion or pneumothorax. Degenerative changes are present in the thoracic spine. Shoulder arthroplasty changes are noted on the left. IMPRESSION: 1. Parenchymal opacity and fiduciary marker in the right upper lobe, not significantly changed from recent CT. 2. Otherwise no acute process. Electronically Signed   By: Brett Fairy M.D.   On: 03/16/2022 23:48    ASSESSMENT AND PLAN: This is a very pleasant 76 years old African-American female diagnosed with a stage IA (T1b, N0, M0) non-small cell lung cancer, adenocarcinoma in September 2022 The patient is status post SBRT to the right upper lobe lung nodule completed in November 2022. The patient is currently on observation and she is feeling fine with no concerning complaints except for intermittent right-sided chest pain likely secondary to her old rib fracture or scarring from the radiotherapy. She had repeat CT scan of the chest performed recently.  I personally and independently reviewed the scan images and discussed the results with the patient today. Her scan showed no concerning findings for disease progression but there was a slight  increase of 2 mm difference on the right lung nodule. I recommended for her to continue on observation with repeat CT scan of the chest in 6 months. For the hypertension she was strongly encouraged to take her blood pressure medication as prescribed.  I will give the patient a dose of clonidine 0.2 mg x 1 today and we will repeat her blood pressure measurement again.  If it continues to be elevated we will have to send the patient to the emergency department for further evaluation and she will need to reach out to her primary care physician for adjustment of her medication. The patient was advised to call immediately if she has any other concerning symptoms in the interval.  The patient voices understanding of current disease status and treatment  options and is in agreement with the current care plan.  All questions were answered. The patient knows to call the clinic with any problems, questions or concerns. We can certainly see the patient much sooner if necessary.  The total time spent in the appointment was 30 minutes.  Disclaimer: This note was dictated with voice recognition software. Similar sounding words can inadvertently be transcribed and may not be corrected upon review.

## 2022-04-07 DIAGNOSIS — N644 Mastodynia: Secondary | ICD-10-CM | POA: Diagnosis not present

## 2022-04-07 DIAGNOSIS — J700 Acute pulmonary manifestations due to radiation: Secondary | ICD-10-CM | POA: Diagnosis not present

## 2022-04-07 DIAGNOSIS — I7121 Aneurysm of the ascending aorta, without rupture: Secondary | ICD-10-CM | POA: Diagnosis not present

## 2022-04-07 DIAGNOSIS — I10 Essential (primary) hypertension: Secondary | ICD-10-CM | POA: Diagnosis not present

## 2022-04-07 DIAGNOSIS — C3491 Malignant neoplasm of unspecified part of right bronchus or lung: Secondary | ICD-10-CM | POA: Diagnosis not present

## 2022-04-08 ENCOUNTER — Other Ambulatory Visit: Payer: Self-pay | Admitting: Registered Nurse

## 2022-04-08 DIAGNOSIS — N644 Mastodynia: Secondary | ICD-10-CM

## 2022-04-10 ENCOUNTER — Ambulatory Visit: Payer: Self-pay | Admitting: Student

## 2022-04-10 NOTE — H&P (Signed)
TOTAL KNEE ADMISSION H&P  Patient is being admitted for left total knee arthroplasty.  Subjective:  Chief Complaint:left knee pain.  HPI: Heather Ellis, 76 y.o. female, has a history of pain and functional disability in the left knee due to arthritis and has failed non-surgical conservative treatments for greater than 12 weeks to includeNSAID's and/or analgesics, corticosteriod injections, flexibility and strengthening excercises, use of assistive devices, and activity modification.  Onset of symptoms was gradual, starting 10 years ago with rapidlly worsening course since that time. The patient noted no past surgery on the left knee(s).  Patient currently rates pain in the left knee(s) at 10 out of 10 with activity. Patient has night pain, worsening of pain with activity and weight bearing, pain that interferes with activities of daily living, pain with passive range of motion, crepitus, and joint swelling.  Patient has evidence of subchondral cysts, subchondral sclerosis, periarticular osteophytes, and joint space narrowing by imaging studies. There is no active infection.  Patient Active Problem List   Diagnosis Date Noted   Rheumatoid arthritis ([place]) 03/16/2022   S/P reverse total shoulder arthroplasty, left 10/24/2021   Pre-operative respiratory examination 09/18/2021   Vitamin D deficiency 06/11/2021   Sebaceous cyst 06/11/2021   Sciatica 06/11/2021   Prediabetes 06/11/2021   Hyperglycemia 06/11/2021   COPD, very severe ([place]) 06/11/2021   Chronic pain 06/11/2021   Adenocarcinoma of right lung, stage 1 ([place]) 05/29/2021   Pulmonary nodule 1 cm or greater in diameter 04/04/2021   Primary osteoarthritis of hip 01/16/2020   Primary osteoarthritis of left hip 12/26/2019   Asthma 12/26/2019   Essential hypertension 10/10/2018   GERD (gastroesophageal reflux disease) 10/10/2018   Syncope and collapse 10/10/2018   AKI (acute kidney injury) ([place]) 10/10/2018   Syncope 09/22/2018   DDD  (degenerative disc disease), cervical 12/23/2017   Post-menopause on HRT (hormone replacement therapy) 04/27/2016   Osteoporosis, unspecified 12/06/2013   Vaginal atrophy 12/06/2013   Past Medical History:  Diagnosis Date   Acid reflux    Anemia    Asthma    Cancer (HCC)    Right lung   COPD (chronic obstructive pulmonary disease) ([place])    Dyspnea    WITH EXERTION   H/O polymyalgia rheumatica    History of COVID-19 2020   Hypertension    OA (osteoarthritis)    Obesity    Osteoporosis    Pre-diabetes    Syncope    Syncope and collapse 08/2018   Vitamin D deficiency     Past Surgical History:  Procedure Laterality Date   ABDOMINAL HYSTERECTOMY     TOTAL ABDOMINAL HYSTERECTOMY    BRONCHIAL BIOPSY  05/12/2021   Procedure: BRONCHIAL BIOPSIES;  Surgeon: Collene Gobble, MD;  Location: [facility] ENDOSCOPY;  Service: Pulmonary;;   BRONCHIAL BRUSHINGS  05/12/2021   Procedure: BRONCHIAL BRUSHINGS;  Surgeon: Collene Gobble, MD;  Location: [place];  Service: Pulmonary;;   BRONCHIAL NEEDLE ASPIRATION BIOPSY  05/12/2021   Procedure: BRONCHIAL NEEDLE ASPIRATION BIOPSIES;  Surgeon: Collene Gobble, MD;  Location: [place] ENDOSCOPY;  Service: Pulmonary;;   COLONOSCOPY W/ BIOPSIES  2015   ESOPHAGOGASTRODUODENOSCOPY     FIDUCIAL MARKER PLACEMENT  05/12/2021   Procedure: FIDUCIAL MARKER PLACEMENT;  Surgeon: Collene Gobble, MD;  Location: [facility] ENDOSCOPY;  Service: Pulmonary;;   OOPHORECTOMY     BSO   REVERSE SHOULDER ARTHROPLASTY Left 10/24/2021   Procedure: REVERSE SHOULDER ARTHROPLASTY;  Surgeon: Nicholes Stairs, MD;  Location: [facility] ORS;  Service: Orthopedics;  Laterality:  Left;   TOTAL HIP ARTHROPLASTY Left 01/16/2020   Procedure: TOTAL HIP ARTHROPLASTY ANTERIOR APPROACH;  Surgeon: Renette Butters, MD;  Location: [facility] ORS;  Service: Orthopedics;  Laterality: Left;   VIDEO BRONCHOSCOPY WITH ENDOBRONCHIAL NAVIGATION Right 05/12/2021   Procedure: ROBOTIC ASSISTED BRONCHOSCOPY WITH ENDOBRONCHIAL  NAVIGATION;  Surgeon: Collene Gobble, MD;  Location: [facility] ENDOSCOPY;  Service: Pulmonary;  Laterality: Right;   VIDEO BRONCHOSCOPY WITH RADIAL ENDOBRONCHIAL ULTRASOUND  05/12/2021   Procedure: VIDEO BRONCHOSCOPY WITH RADIAL ENDOBRONCHIAL ULTRASOUND;  Surgeon: Collene Gobble, MD;  Location: [facility] ENDOSCOPY;  Service: Pulmonary;;    Current Outpatient Medications  Medication Sig Dispense Refill Last Dose   albuterol (VENTOLIN HFA) 108 (90 Base) MCG/ACT inhaler Inhale 2 puffs into the lungs every 6 (six) hours as needed for wheezing or shortness of breath.      aspirin EC 81 MG tablet Take 81 mg by mouth daily. Swallow whole.      bisoprolol-hydrochlorothiazide (ZIAC) 5-6.25 MG tablet Take 2 tablets by mouth in the morning.      cholecalciferol (VITAMIN D) 1000 UNITS tablet Take 1,000 Units by mouth daily in the afternoon.      folic acid (FOLVITE) 1 MG tablet Take 1 mg by mouth daily.      lisinopril (ZESTRIL) 40 MG tablet Take 40 mg by mouth daily.      Menthol, Topical Analgesic, (BIOFREEZE EX) Apply 1 application topically daily as needed (soreness).      methotrexate 2.5 MG tablet Take 20 mg by mouth every Monday.      omeprazole (PRILOSEC) 20 MG capsule Take 20 mg by mouth daily as needed (acid reflux/heartburn).      ondansetron (ZOFRAN) 4 MG tablet Take 1 tablet (4 mg total) by mouth daily as needed for nausea or vomiting. 20 tablet 1    traMADol (ULTRAM) 50 MG tablet Take 50 mg by mouth every 6 (six) hours as needed for severe pain.      umeclidinium-vilanterol (ANORO ELLIPTA) 62.5-25 MCG/ACT AEPB Inhale 1 puff into the lungs daily. 60 each 3    No current facility-administered medications for this visit.   Allergies  Allergen Reactions   Fosamax [Alendronate Sodium] Nausea And Vomiting    Social History   Tobacco Use   Smoking status: Former    Types: Cigarettes    Quit date: 10/19/2014    Years since quitting: 7.4   Smokeless tobacco: Never  Substance Use Topics   Alcohol use:  Not Currently    Family History  Problem Relation Age of Onset   Cancer Mother        THROAT- SMOKER   Diabetes Mother    Seizures Mother    Syncope episode Mother    Diabetes Sister    Heart disease Maternal Aunt    Stroke Maternal Aunt    Cancer Maternal Uncle        LUNG -    Heart disease Maternal Uncle    Stroke Maternal Uncle    Ovarian cancer Maternal Grandmother    Heart attack Father    Heart disease Father    Syncope episode Brother      Review of Systems  Musculoskeletal:  Positive for arthralgias, gait problem, joint swelling and myalgias.  All other systems reviewed and are negative.   Objective:  Physical Exam Constitutional:      Appearance: Normal appearance.  HENT:     Head: Normocephalic and atraumatic.     Nose: Nose normal.  Mouth/Throat:     Mouth: Mucous membranes are moist.     Pharynx: Oropharynx is clear.  Eyes:     Extraocular Movements: Extraocular movements intact.  Cardiovascular:     Rate and Rhythm: Normal rate and regular rhythm.     Pulses: Normal pulses.     Heart sounds: Normal heart sounds.  Pulmonary:     Effort: Pulmonary effort is normal.     Breath sounds: Normal breath sounds.  Abdominal:     General: Abdomen is flat.     Palpations: Abdomen is soft.  Genitourinary:    Comments: deferred Musculoskeletal:     Cervical back: Normal range of motion and neck supple.     Comments: Examination of the left knee reveals no skin wounds or lesions. She has some swelling, trace effusion. No warmth or erythema. Tenderness to palpation medial joint line, lateral joint line, and peripatellar retinacular tissues with a positive grind sign. Range of motion 15 to 110 degrees without any ligamentous instability. She has varus/valgus pseudolaxity. Crepitation with range of motion. Painless range of motion of the hip.  Sensory and motor function intact in LE bilaterally. Distal pedal pulses 2+ bilaterally.   No significant pedal edema.  Calves soft and non-tender.   Skin:    General: Skin is warm and dry.     Capillary Refill: Capillary refill takes less than 2 seconds.  Neurological:     General: No focal deficit present.     Mental Status: She is alert and oriented to person, place, and time.  Psychiatric:        Mood and Affect: Mood normal.        Behavior: Behavior normal.        Thought Content: Thought content normal.        Judgment: Judgment normal.     Vital signs in last 24 hours: @VSRANGES @  Labs:   Estimated body mass index is 27.92 kg/m as calculated from the following:   Height as of 03/16/22: 5\' 3"  (1.6 m).   Weight as of 04/02/22: 71.5 kg.   Imaging Review Plain radiographs demonstrate severe degenerative joint disease of the left knee(s). The overall alignment ismild varus. The bone quality appears to be adequate for age and reported activity level.      Assessment/Plan:  End stage arthritis, left knee   The patient history, physical examination, clinical judgment of the provider and imaging studies are consistent with end stage degenerative joint disease of the left knee(s) and total knee arthroplasty is deemed medically necessary. The treatment options including medical management, injection therapy arthroscopy and arthroplasty were discussed at length. The risks and benefits of total knee arthroplasty were presented and reviewed. The risks due to aseptic loosening, infection, stiffness, patella tracking problems, thromboembolic complications and other imponderables were discussed. The patient acknowledged the explanation, agreed to proceed with the plan and consent was signed. Patient is being admitted for inpatient treatment for surgery, pain control, PT, OT, prophylactic antibiotics, VTE prophylaxis, progressive ambulation and ADL's and discharge planning. The patient is planning to be discharged home after an overnight stay.   Therapy Plans: outpatient therapy at [facility] Disposition: Home  with daughter Planned DVT Prophylaxis: Eliquis 2.5mg  BID DME needed: None. Has rolling walker at home.  PCP: Cleared Pulmonology: Cleared Oncology: Cleared TXA: IV Allergies: NDKA Anesthesia Concerns: None.  BMI: 29.4 Last HgbA1c: 6.0 Other: - History of lung cancer with radiation. Asthma. COPD.  - History of RA - methotrexate, stop today  -  Oxycodone, meloxicam, zofran.  - Cr. 0.65, Hgb 10.7.  - Recent visit with BP 200/100, discussed importance of taking her BP medication. She states she has been taking them since the visit.     Patient's anticipated LOS is less than 2 midnights, meeting these requirements: - Younger than 46 - Lives within 1 hour of care - Has a competent adult at home to recover with post-op recover - NO history of  - Chronic pain requiring opiods  - Diabetes  - Coronary Artery Disease  - Heart failure  - Heart attack  - Stroke  - DVT/VTE  - Cardiac arrhythmia  - Respiratory Failure/COPD  - Renal failure  - Anemia  - Advanced Liver disease

## 2022-04-10 NOTE — H&P (View-Only) (Signed)
TOTAL KNEE ADMISSION H&P  Patient is being admitted for left total knee arthroplasty.  Subjective:  Chief Complaint:left knee pain.  HPI: Heather Ellis, 76 y.o. female, has a history of pain and functional disability in the left knee due to arthritis and has failed non-surgical conservative treatments for greater than 12 weeks to includeNSAID's and/or analgesics, corticosteriod injections, flexibility and strengthening excercises, use of assistive devices, and activity modification.  Onset of symptoms was gradual, starting 10 years ago with rapidlly worsening course since that time. The patient noted no past surgery on the left knee(s).  Patient currently rates pain in the left knee(s) at 10 out of 10 with activity. Patient has night pain, worsening of pain with activity and weight bearing, pain that interferes with activities of daily living, pain with passive range of motion, crepitus, and joint swelling.  Patient has evidence of subchondral cysts, subchondral sclerosis, periarticular osteophytes, and joint space narrowing by imaging studies. There is no active infection.  Patient Active Problem List   Diagnosis Date Noted   Rheumatoid arthritis ([place]) 03/16/2022   S/P reverse total shoulder arthroplasty, left 10/24/2021   Pre-operative respiratory examination 09/18/2021   Vitamin D deficiency 06/11/2021   Sebaceous cyst 06/11/2021   Sciatica 06/11/2021   Prediabetes 06/11/2021   Hyperglycemia 06/11/2021   COPD, very severe ([place]) 06/11/2021   Chronic pain 06/11/2021   Adenocarcinoma of right lung, stage 1 ([place]) 05/29/2021   Pulmonary nodule 1 cm or greater in diameter 04/04/2021   Primary osteoarthritis of hip 01/16/2020   Primary osteoarthritis of left hip 12/26/2019   Asthma 12/26/2019   Essential hypertension 10/10/2018   GERD (gastroesophageal reflux disease) 10/10/2018   Syncope and collapse 10/10/2018   AKI (acute kidney injury) ([place]) 10/10/2018   Syncope 09/22/2018   DDD  (degenerative disc disease), cervical 12/23/2017   Post-menopause on HRT (hormone replacement therapy) 04/27/2016   Osteoporosis, unspecified 12/06/2013   Vaginal atrophy 12/06/2013   Past Medical History:  Diagnosis Date   Acid reflux    Anemia    Asthma    Cancer (HCC)    Right lung   COPD (chronic obstructive pulmonary disease) ([place])    Dyspnea    WITH EXERTION   H/O polymyalgia rheumatica    History of COVID-19 2020   Hypertension    OA (osteoarthritis)    Obesity    Osteoporosis    Pre-diabetes    Syncope    Syncope and collapse 08/2018   Vitamin D deficiency     Past Surgical History:  Procedure Laterality Date   ABDOMINAL HYSTERECTOMY     TOTAL ABDOMINAL HYSTERECTOMY    BRONCHIAL BIOPSY  05/12/2021   Procedure: BRONCHIAL BIOPSIES;  Surgeon: Collene Gobble, MD;  Location: [facility] ENDOSCOPY;  Service: Pulmonary;;   BRONCHIAL BRUSHINGS  05/12/2021   Procedure: BRONCHIAL BRUSHINGS;  Surgeon: Collene Gobble, MD;  Location: [facility];  Service: Pulmonary;;   BRONCHIAL NEEDLE ASPIRATION BIOPSY  05/12/2021   Procedure: BRONCHIAL NEEDLE ASPIRATION BIOPSIES;  Surgeon: Collene Gobble, MD;  Location: [facility] ENDOSCOPY;  Service: Pulmonary;;   COLONOSCOPY W/ BIOPSIES  2015   ESOPHAGOGASTRODUODENOSCOPY     FIDUCIAL MARKER PLACEMENT  05/12/2021   Procedure: FIDUCIAL MARKER PLACEMENT;  Surgeon: Collene Gobble, MD;  Location: [facility] ENDOSCOPY;  Service: Pulmonary;;   OOPHORECTOMY     BSO   REVERSE SHOULDER ARTHROPLASTY Left 10/24/2021   Procedure: REVERSE SHOULDER ARTHROPLASTY;  Surgeon: Nicholes Stairs, MD;  Location: [facility] ORS;  Service: Orthopedics;  Laterality:  Left;   TOTAL HIP ARTHROPLASTY Left 01/16/2020   Procedure: TOTAL HIP ARTHROPLASTY ANTERIOR APPROACH;  Surgeon: Renette Butters, MD;  Location: [facility] ORS;  Service: Orthopedics;  Laterality: Left;   VIDEO BRONCHOSCOPY WITH ENDOBRONCHIAL NAVIGATION Right 05/12/2021   Procedure: ROBOTIC ASSISTED BRONCHOSCOPY WITH ENDOBRONCHIAL  NAVIGATION;  Surgeon: Collene Gobble, MD;  Location: [facility] ENDOSCOPY;  Service: Pulmonary;  Laterality: Right;   VIDEO BRONCHOSCOPY WITH RADIAL ENDOBRONCHIAL ULTRASOUND  05/12/2021   Procedure: VIDEO BRONCHOSCOPY WITH RADIAL ENDOBRONCHIAL ULTRASOUND;  Surgeon: Collene Gobble, MD;  Location: [facility] ENDOSCOPY;  Service: Pulmonary;;    Current Outpatient Medications  Medication Sig Dispense Refill Last Dose   albuterol (VENTOLIN HFA) 108 (90 Base) MCG/ACT inhaler Inhale 2 puffs into the lungs every 6 (six) hours as needed for wheezing or shortness of breath.      aspirin EC 81 MG tablet Take 81 mg by mouth daily. Swallow whole.      bisoprolol-hydrochlorothiazide (ZIAC) 5-6.25 MG tablet Take 2 tablets by mouth in the morning.      cholecalciferol (VITAMIN D) 1000 UNITS tablet Take 1,000 Units by mouth daily in the afternoon.      folic acid (FOLVITE) 1 MG tablet Take 1 mg by mouth daily.      lisinopril (ZESTRIL) 40 MG tablet Take 40 mg by mouth daily.      Menthol, Topical Analgesic, (BIOFREEZE EX) Apply 1 application topically daily as needed (soreness).      methotrexate 2.5 MG tablet Take 20 mg by mouth every Monday.      omeprazole (PRILOSEC) 20 MG capsule Take 20 mg by mouth daily as needed (acid reflux/heartburn).      ondansetron (ZOFRAN) 4 MG tablet Take 1 tablet (4 mg total) by mouth daily as needed for nausea or vomiting. 20 tablet 1    traMADol (ULTRAM) 50 MG tablet Take 50 mg by mouth every 6 (six) hours as needed for severe pain.      umeclidinium-vilanterol (ANORO ELLIPTA) 62.5-25 MCG/ACT AEPB Inhale 1 puff into the lungs daily. 60 each 3    No current facility-administered medications for this visit.   Allergies  Allergen Reactions   Fosamax [Alendronate Sodium] Nausea And Vomiting    Social History   Tobacco Use   Smoking status: Former    Types: Cigarettes    Quit date: 10/19/2014    Years since quitting: 7.4   Smokeless tobacco: Never  Substance Use Topics   Alcohol use:  Not Currently    Family History  Problem Relation Age of Onset   Cancer Mother        THROAT- SMOKER   Diabetes Mother    Seizures Mother    Syncope episode Mother    Diabetes Sister    Heart disease Maternal Aunt    Stroke Maternal Aunt    Cancer Maternal Uncle        LUNG -    Heart disease Maternal Uncle    Stroke Maternal Uncle    Ovarian cancer Maternal Grandmother    Heart attack Father    Heart disease Father    Syncope episode Brother      Review of Systems  Musculoskeletal:  Positive for arthralgias, gait problem, joint swelling and myalgias.  All other systems reviewed and are negative.   Objective:  Physical Exam Constitutional:      Appearance: Normal appearance.  HENT:     Head: Normocephalic and atraumatic.     Nose: Nose normal.  Mouth/Throat:     Mouth: Mucous membranes are moist.     Pharynx: Oropharynx is clear.  Eyes:     Extraocular Movements: Extraocular movements intact.  Cardiovascular:     Rate and Rhythm: Normal rate and regular rhythm.     Pulses: Normal pulses.     Heart sounds: Normal heart sounds.  Pulmonary:     Effort: Pulmonary effort is normal.     Breath sounds: Normal breath sounds.  Abdominal:     General: Abdomen is flat.     Palpations: Abdomen is soft.  Genitourinary:    Comments: deferred Musculoskeletal:     Cervical back: Normal range of motion and neck supple.     Comments: Examination of the left knee reveals no skin wounds or lesions. She has some swelling, trace effusion. No warmth or erythema. Tenderness to palpation medial joint line, lateral joint line, and peripatellar retinacular tissues with a positive grind sign. Range of motion 15 to 110 degrees without any ligamentous instability. She has varus/valgus pseudolaxity. Crepitation with range of motion. Painless range of motion of the hip.  Sensory and motor function intact in LE bilaterally. Distal pedal pulses 2+ bilaterally.   No significant pedal edema.  Calves soft and non-tender.   Skin:    General: Skin is warm and dry.     Capillary Refill: Capillary refill takes less than 2 seconds.  Neurological:     General: No focal deficit present.     Mental Status: She is alert and oriented to person, place, and time.  Psychiatric:        Mood and Affect: Mood normal.        Behavior: Behavior normal.        Thought Content: Thought content normal.        Judgment: Judgment normal.     Vital signs in last 24 hours: @VSRANGES @  Labs:   Estimated body mass index is 27.92 kg/m as calculated from the following:   Height as of 03/16/22: 5\' 3"  (1.6 m).   Weight as of 04/02/22: 71.5 kg.   Imaging Review Plain radiographs demonstrate severe degenerative joint disease of the left knee(s). The overall alignment ismild varus. The bone quality appears to be adequate for age and reported activity level.      Assessment/Plan:  End stage arthritis, left knee   The patient history, physical examination, clinical judgment of the provider and imaging studies are consistent with end stage degenerative joint disease of the left knee(s) and total knee arthroplasty is deemed medically necessary. The treatment options including medical management, injection therapy arthroscopy and arthroplasty were discussed at length. The risks and benefits of total knee arthroplasty were presented and reviewed. The risks due to aseptic loosening, infection, stiffness, patella tracking problems, thromboembolic complications and other imponderables were discussed. The patient acknowledged the explanation, agreed to proceed with the plan and consent was signed. Patient is being admitted for inpatient treatment for surgery, pain control, PT, OT, prophylactic antibiotics, VTE prophylaxis, progressive ambulation and ADL's and discharge planning. The patient is planning to be discharged home after an overnight stay.   Therapy Plans: outpatient therapy at [facility] Disposition: Home  with daughter Planned DVT Prophylaxis: Eliquis 2.5mg  BID DME needed: None. Has rolling walker at home.  PCP: Cleared Pulmonology: Cleared Oncology: Cleared TXA: IV Allergies: NDKA Anesthesia Concerns: None.  BMI: 29.4 Last HgbA1c: 6.0 Other: - History of lung cancer with radiation. Asthma. COPD.  - History of RA - methotrexate, stop today  -  Oxycodone, meloxicam, zofran.  - Cr. 0.65, Hgb 10.7.  - Recent visit with BP 200/100, discussed importance of taking her BP medication. She states she has been taking them since the visit.     Patient's anticipated LOS is less than 2 midnights, meeting these requirements: - Younger than 60 - Lives within 1 hour of care - Has a competent adult at home to recover with post-op recover - NO history of  - Chronic pain requiring opiods  - Diabetes  - Coronary Artery Disease  - Heart failure  - Heart attack  - Stroke  - DVT/VTE  - Cardiac arrhythmia  - Respiratory Failure/COPD  - Renal failure  - Anemia  - Advanced Liver disease

## 2022-04-13 NOTE — Patient Instructions (Signed)
SURGICAL WAITING ROOM VISITATION Patients having surgery or a procedure may have no more than 2 support people in the waiting area - these visitors may rotate.   Children under the age of 47 must have an adult with them who is not the patient. If the patient needs to stay at the hospital during part of their recovery, the visitor guidelines for inpatient rooms apply. Pre-op nurse will coordinate an appropriate time for 1 support person to accompany patient in pre-op.  This support person may not rotate.    Please refer to the [facility] website for the visitor guidelines for Inpatients (after your surgery is over and you are in a regular room).     Your procedure is scheduled on: 04/22/22   Report to [facility] Main Entrance    Report to admitting at 5:15 AM   Call this number if you have problems the morning of surgery [phone]   Do not eat food :After Midnight.   After Midnight you may have the following liquids until 4:30 AM DAY OF SURGERY  Water Non-Citrus Juices (without pulp, NO RED) Carbonated Beverages Black Coffee (NO MILK/CREAM OR CREAMERS, sugar ok)  Clear Tea (NO MILK/CREAM OR CREAMERS, sugar ok) regular and decaf                             Plain Jell-O (NO RED)                                           Fruit ices (not with fruit pulp, NO RED)                                     Popsicles (NO RED)                                                               Sports drinks like Gatorade (NO RED)               The day of surgery:  Drink ONE (1) Pre-Surgery G2 at 4:30 AM the morning of surgery. Drink in one sitting. Do not sip.  This drink was given to you during your hospital  pre-op appointment visit. Nothing else to drink after completing the  Pre-Surgery G2.          If you have questions, please contact your surgeon's office.   FOLLOW BOWEL PREP AND ANY ADDITIONAL PRE OP INSTRUCTIONS YOU RECEIVED FROM YOUR SURGEON'S OFFICE!!!     Oral Hygiene is  also important to reduce your risk of infection.                                    Remember - BRUSH YOUR TEETH THE MORNING OF SURGERY WITH YOUR REGULAR TOOTHPASTE   Take these medicines the morning of surgery with A SIP OF WATER: Inhalers, Omeprazole, Tramadol  You may not have any metal on your body including hair pins, jewelry, and body piercing             Do not wear make-up, lotions, powders, perfumes, or deodorant  Do not wear nail polish including gel and S&S, artificial/acrylic nails, or any other type of covering on natural nails including finger and toenails. If you have artificial nails, gel coating, etc. that needs to be removed by a nail salon please have this removed prior to surgery or surgery may need to be canceled/ delayed if the surgeon/ anesthesia feels like they are unable to be safely monitored.   Do not shave  48 hours prior to surgery.    Do not bring valuables to the hospital. [place].   Bring small overnight bag day of surgery.   DO NOT [place]. PHARMACY WILL DISPENSE MEDICATIONS LISTED ON YOUR MEDICATION LIST TO YOU DURING YOUR ADMISSION [place]!               Please read over the following fact sheets you were given: IF YOU HAVE QUESTIONS ABOUT YOUR PRE-OP INSTRUCTIONS PLEASE CALL [facility] - Preparing for Surgery Before surgery, you can play an important role.  Because skin is not sterile, your skin needs to be as free of germs as possible.  You can reduce the number of germs on your skin by washing with CHG (chlorahexidine gluconate) soap before surgery.  CHG is an antiseptic cleaner which kills germs and bonds with the skin to continue killing germs even after washing. Please DO NOT use if you have an allergy to CHG or antibacterial soaps.  If your skin becomes reddened/irritated stop using the CHG and inform  your nurse when you arrive at Short Stay. Do not shave (including legs and underarms) for at least 48 hours prior to the first CHG shower.  You may shave your face/neck.  Please follow these instructions carefully:  1.  Shower with CHG Soap the night before surgery and the  morning of surgery.  2.  If you choose to wash your hair, wash your hair first as usual with your normal  shampoo.  3.  After you shampoo, rinse your hair and body thoroughly to remove the shampoo.                             4.  Use CHG as you would any other liquid soap.  You can apply chg directly to the skin and wash.  Gently with a scrungie or clean washcloth.  5.  Apply the CHG Soap to your body ONLY FROM THE NECK DOWN.   Do   not use on face/ open                           Wound or open sores. Avoid contact with eyes, ears mouth and   genitals (private parts).                       Wash face,  Genitals (private parts) with your normal soap.             6.  Wash thoroughly, paying special attention to the area where your  surgery  will be performed.  7.  Thoroughly rinse your body with warm water from the neck down.  8.  DO NOT shower/wash with your normal soap after using and rinsing off the CHG Soap.                9.  Pat yourself dry with a clean towel.            10.  Wear clean pajamas.            11.  Place clean sheets on your bed the night of your first shower and do not  sleep with pets. Day of Surgery : Do not apply any lotions/deodorants the morning of surgery.  Please wear clean clothes to the hospital/surgery center.  FAILURE TO FOLLOW THESE INSTRUCTIONS MAY RESULT IN THE CANCELLATION OF YOUR SURGERY  PATIENT SIGNATURE_________________________________  NURSE SIGNATURE__________________________________  ________________________________________________________________________   Heather Ellis  An incentive spirometer is a tool that can help keep your lungs clear and active. This tool  measures how well you are filling your lungs with each breath. Taking long deep breaths may help reverse or decrease the chance of developing breathing (pulmonary) problems (especially infection) following: A long period of time when you are unable to move or be active. BEFORE THE PROCEDURE  If the spirometer includes an indicator to show your best effort, your nurse or respiratory therapist will set it to a desired goal. If possible, sit up straight or lean slightly forward. Try not to slouch. Hold the incentive spirometer in an upright position. INSTRUCTIONS FOR USE  Sit on the edge of your bed if possible, or sit up as far as you can in bed or on a chair. Hold the incentive spirometer in an upright position. Breathe out normally. Place the mouthpiece in your mouth and seal your lips tightly around it. Breathe in slowly and as deeply as possible, raising the piston or the ball toward the top of the column. Hold your breath for 3-5 seconds or for as long as possible. Allow the piston or ball to fall to the bottom of the column. Remove the mouthpiece from your mouth and breathe out normally. Rest for a few seconds and repeat Steps 1 through 7 at least 10 times every 1-2 hours when you are awake. Take your time and take a few normal breaths between deep breaths. The spirometer may include an indicator to show your best effort. Use the indicator as a goal to work toward during each repetition. After each set of 10 deep breaths, practice coughing to be sure your lungs are clear. If you have an incision (the cut made at the time of surgery), support your incision when coughing by placing a pillow or rolled up towels firmly against it. Once you are able to get out of bed, walk around indoors and cough well. You may stop using the incentive spirometer when instructed by your caregiver.  RISKS AND COMPLICATIONS Take your time so you do not get dizzy or light-headed. If you are in pain, you may need to  take or ask for pain medication before doing incentive spirometry. It is harder to take a deep breath if you are having pain. AFTER USE Rest and breathe slowly and easily. It can be helpful to keep track of a log of your progress. Your caregiver can provide you with a simple table to help with this. If you are using the spirometer at home, follow these instructions: [place] IF:  You are having difficultly using the spirometer. You have trouble using the spirometer as often as instructed. Your pain medication is not giving enough relief while using the spirometer. You develop fever of 100.5 F (38.1 C) or higher. SEEK IMMEDIATE MEDICAL CARE IF:  You cough up bloody sputum that had not been present before. You develop fever of 102 F (38.9 C) or greater. You develop worsening pain at or near the incision site. MAKE SURE YOU:  Understand these instructions. Will watch your condition. Will get help right away if you are not doing well or get worse. Document Released: 12/28/2006 Document Revised: 11/09/2011 Document Reviewed: 02/28/2007 [facility] Patient Information 2014 [place], [place].   ________________________________________________________________________

## 2022-04-13 NOTE — Progress Notes (Addendum)
COVID Vaccine Completed: yes x3  Date of COVID positive in last 90 days: no  PCP - Merri Brunette, MD Cardiologist - n/a  Oncology clearance 03/19/22 on chart  Chest x-ray - n/a EKG - 02/26/22 on chart Stress Test - n/a ECHO - 09/23/18 Epic Cardiac Cath - n/a Pacemaker/ICD device last checked: n/a Spinal Cord Stimulator: n/a CT- 03/26/22 Epic  Bowel Prep - no  Sleep Study - n/a CPAP -   Fasting Blood Sugar - pre DM, no checks at home or medications  Checks Blood Sugar _____ times a day  Blood Thinner Instructions: Aspirin Instructions: ASA 81, no instructions to stop per pt. Patient will call to confirm  Last Dose:  Activity level: Can go up a flight of stairs and perform activities of daily living without stopping and without symptoms of chest pain. SOB with exertion, has hx of asthma   Anesthesia review: high risk pulmonary status, lung cancer, severe COPD, HTN, SOB  Patient denies shortness of breath, fever, cough and chest pain at PAT appointment  Patient verbalized understanding of instructions that were given to them at the PAT appointment. Patient was also instructed that they will need to review over the PAT instructions again at home before surgery.

## 2022-04-14 ENCOUNTER — Encounter (HOSPITAL_COMMUNITY)
Admission: RE | Admit: 2022-04-14 | Discharge: 2022-04-14 | Disposition: A | Discharge status: Home / Self Care | Payer: No Typology Code available for payment source | Source: Ambulatory Visit | Attending: Orthopedic Surgery | Admitting: Orthopedic Surgery

## 2022-04-14 ENCOUNTER — Encounter (HOSPITAL_COMMUNITY): Payer: Self-pay

## 2022-04-14 VITALS — BP 144/100 | HR 73 | Temp 97.9°F | Resp 12 | Ht 63.0 in | Wt 149.0 lb

## 2022-04-14 DIAGNOSIS — Z01812 Encounter for preprocedural laboratory examination: Secondary | ICD-10-CM | POA: Insufficient documentation

## 2022-04-14 DIAGNOSIS — R7303 Prediabetes: Secondary | ICD-10-CM | POA: Diagnosis not present

## 2022-04-14 DIAGNOSIS — Z01818 Encounter for other preprocedural examination: Secondary | ICD-10-CM

## 2022-04-14 LAB — GLUCOSE, CAPILLARY: Glucose-Capillary: 110 mg/dL — ABNORMAL HIGH (ref 70–99)

## 2022-04-14 LAB — HEMOGLOBIN A1C
Hgb A1c MFr Bld: 5.3 % (ref 4.8–5.6)
Mean Plasma Glucose: 105.41 mg/dL

## 2022-04-15 LAB — SURGICAL PCR SCREEN
MRSA, PCR: POSITIVE — AB
Staphylococcus aureus: POSITIVE — AB

## 2022-04-15 NOTE — Progress Notes (Signed)
STAPH+, MRSA+ results routed  to Dr. Lyla Glassing

## 2022-04-16 ENCOUNTER — Ambulatory Visit
Admission: RE | Admit: 2022-04-16 | Discharge: 2022-04-16 | Disposition: A | Discharge status: Home / Self Care | Payer: No Typology Code available for payment source | Source: Ambulatory Visit | Attending: Registered Nurse | Admitting: Registered Nurse

## 2022-04-16 ENCOUNTER — Ambulatory Visit: Payer: No Typology Code available for payment source

## 2022-04-16 ENCOUNTER — Other Ambulatory Visit: Payer: Self-pay | Admitting: Registered Nurse

## 2022-04-16 DIAGNOSIS — N644 Mastodynia: Secondary | ICD-10-CM | POA: Diagnosis not present

## 2022-04-21 DIAGNOSIS — M79643 Pain in unspecified hand: Secondary | ICD-10-CM | POA: Diagnosis not present

## 2022-04-21 DIAGNOSIS — Z79899 Other long term (current) drug therapy: Secondary | ICD-10-CM | POA: Diagnosis not present

## 2022-04-21 DIAGNOSIS — Z7952 Long term (current) use of systemic steroids: Secondary | ICD-10-CM | POA: Diagnosis not present

## 2022-04-21 DIAGNOSIS — M810 Age-related osteoporosis without current pathological fracture: Secondary | ICD-10-CM | POA: Diagnosis not present

## 2022-04-21 DIAGNOSIS — M0609 Rheumatoid arthritis without rheumatoid factor, multiple sites: Secondary | ICD-10-CM | POA: Diagnosis not present

## 2022-04-21 DIAGNOSIS — M1990 Unspecified osteoarthritis, unspecified site: Secondary | ICD-10-CM | POA: Diagnosis not present

## 2022-04-22 ENCOUNTER — Observation Stay (HOSPITAL_COMMUNITY)
Admission: RE | Admit: 2022-04-22 | Discharge: 2022-04-23 | Disposition: A | Discharge status: Home / Self Care | Payer: No Typology Code available for payment source | Source: Ambulatory Visit | Attending: Orthopedic Surgery | Admitting: Orthopedic Surgery

## 2022-04-22 ENCOUNTER — Other Ambulatory Visit: Payer: Self-pay

## 2022-04-22 ENCOUNTER — Observation Stay (HOSPITAL_COMMUNITY): Payer: No Typology Code available for payment source

## 2022-04-22 ENCOUNTER — Ambulatory Visit (HOSPITAL_COMMUNITY): Payer: No Typology Code available for payment source | Admitting: Physician Assistant

## 2022-04-22 ENCOUNTER — Ambulatory Visit (HOSPITAL_BASED_OUTPATIENT_CLINIC_OR_DEPARTMENT_OTHER): Payer: No Typology Code available for payment source | Admitting: Certified Registered Nurse Anesthetist

## 2022-04-22 ENCOUNTER — Encounter (HOSPITAL_COMMUNITY): Payer: Self-pay | Admitting: Orthopedic Surgery

## 2022-04-22 ENCOUNTER — Encounter (HOSPITAL_COMMUNITY)
Admission: RE | Disposition: A | Discharge status: Home / Self Care | Payer: Self-pay | Source: Ambulatory Visit | Attending: Orthopedic Surgery

## 2022-04-22 DIAGNOSIS — Z96612 Presence of left artificial shoulder joint: Secondary | ICD-10-CM | POA: Diagnosis not present

## 2022-04-22 DIAGNOSIS — Z7982 Long term (current) use of aspirin: Secondary | ICD-10-CM | POA: Diagnosis not present

## 2022-04-22 DIAGNOSIS — M1712 Unilateral primary osteoarthritis, left knee: Principal | ICD-10-CM | POA: Diagnosis present

## 2022-04-22 DIAGNOSIS — Z471 Aftercare following joint replacement surgery: Secondary | ICD-10-CM | POA: Diagnosis not present

## 2022-04-22 DIAGNOSIS — Z8616 Personal history of COVID-19: Secondary | ICD-10-CM | POA: Insufficient documentation

## 2022-04-22 DIAGNOSIS — Z87891 Personal history of nicotine dependence: Secondary | ICD-10-CM | POA: Insufficient documentation

## 2022-04-22 DIAGNOSIS — J45909 Unspecified asthma, uncomplicated: Secondary | ICD-10-CM | POA: Insufficient documentation

## 2022-04-22 DIAGNOSIS — Z85118 Personal history of other malignant neoplasm of bronchus and lung: Secondary | ICD-10-CM | POA: Insufficient documentation

## 2022-04-22 DIAGNOSIS — Z79899 Other long term (current) drug therapy: Secondary | ICD-10-CM | POA: Insufficient documentation

## 2022-04-22 DIAGNOSIS — Z96652 Presence of left artificial knee joint: Secondary | ICD-10-CM | POA: Diagnosis not present

## 2022-04-22 DIAGNOSIS — G8918 Other acute postprocedural pain: Secondary | ICD-10-CM | POA: Diagnosis not present

## 2022-04-22 DIAGNOSIS — J449 Chronic obstructive pulmonary disease, unspecified: Secondary | ICD-10-CM | POA: Insufficient documentation

## 2022-04-22 DIAGNOSIS — I10 Essential (primary) hypertension: Secondary | ICD-10-CM | POA: Diagnosis not present

## 2022-04-22 DIAGNOSIS — R7303 Prediabetes: Secondary | ICD-10-CM

## 2022-04-22 DIAGNOSIS — Z96642 Presence of left artificial hip joint: Secondary | ICD-10-CM | POA: Insufficient documentation

## 2022-04-22 HISTORY — PX: KNEE ARTHROPLASTY: SHX992

## 2022-04-22 LAB — GLUCOSE, CAPILLARY: Glucose-Capillary: 103 mg/dL — ABNORMAL HIGH (ref 70–99)

## 2022-04-22 SURGERY — COMPUTER ASSISTED TOTAL KNEE ARTHROPLASTY
Anesthesia: Monitor Anesthesia Care | Site: Knee | Laterality: Left

## 2022-04-22 MED ORDER — OXYCODONE HCL 5 MG PO TABS: 5.0000 mg | ORAL_TABLET | Freq: Once | ORAL | Status: DC | PRN

## 2022-04-22 MED ORDER — SODIUM CHLORIDE (PF) 0.9 % IJ SOLN
INTRAMUSCULAR | Status: AC
  Filled 2022-04-22: qty 30

## 2022-04-22 MED ORDER — ISOPROPYL ALCOHOL 70 % SOLN
Status: AC
  Filled 2022-04-22: qty 480

## 2022-04-22 MED ORDER — BUPIVACAINE-EPINEPHRINE (PF) 0.5% -1:200000 IJ SOLN
INTRAMUSCULAR | Status: AC
  Filled 2022-04-22: qty 30

## 2022-04-22 MED ORDER — ORAL CARE MOUTH RINSE: 15.0000 mL | OROMUCOSAL | Status: DC | PRN

## 2022-04-22 MED ORDER — ACETAMINOPHEN 325 MG PO TABS
325.0000 mg | ORAL_TABLET | Freq: Four times a day (QID) | ORAL | Status: DC | PRN
  Administered 2022-04-23: 650 mg via ORAL
  Filled 2022-04-22: qty 2

## 2022-04-22 MED ORDER — BISOPROLOL-HYDROCHLOROTHIAZIDE 5-6.25 MG PO TABS: 2.0000 | ORAL_TABLET | Freq: Every morning | ORAL | Status: DC

## 2022-04-22 MED ORDER — HYDROMORPHONE HCL 1 MG/ML IJ SOLN: 0.5000 mg | INTRAMUSCULAR | Status: DC | PRN

## 2022-04-22 MED ORDER — BUPIVACAINE-EPINEPHRINE (PF) 0.5% -1:200000 IJ SOLN
INTRAMUSCULAR | Status: DC | PRN
  Administered 2022-04-22: 30 mL via PERINEURAL

## 2022-04-22 MED ORDER — ACETAMINOPHEN 500 MG PO TABS
1000.0000 mg | ORAL_TABLET | Freq: Four times a day (QID) | ORAL | Status: AC
  Administered 2022-04-22 – 2022-04-23 (×4): 1000 mg via ORAL
  Filled 2022-04-22 (×4): qty 2

## 2022-04-22 MED ORDER — ALBUTEROL SULFATE (2.5 MG/3ML) 0.083% IN NEBU: 3.0000 mL | INHALATION_SOLUTION | Freq: Four times a day (QID) | RESPIRATORY_TRACT | Status: DC | PRN

## 2022-04-22 MED ORDER — SENNA 8.6 MG PO TABS
1.0000 | ORAL_TABLET | Freq: Two times a day (BID) | ORAL | Status: DC
  Administered 2022-04-22 – 2022-04-23 (×3): 8.6 mg via ORAL
  Filled 2022-04-22 (×3): qty 1

## 2022-04-22 MED ORDER — CEFAZOLIN SODIUM-DEXTROSE 2-4 GM/100ML-% IV SOLN
2.0000 g | Freq: Four times a day (QID) | INTRAVENOUS | Status: AC
  Administered 2022-04-22 (×2): 2 g via INTRAVENOUS
  Filled 2022-04-22 (×2): qty 100

## 2022-04-22 MED ORDER — KETOROLAC TROMETHAMINE 30 MG/ML IJ SOLN
INTRAMUSCULAR | Status: AC
  Filled 2022-04-22: qty 1

## 2022-04-22 MED ORDER — METOCLOPRAMIDE HCL 5 MG/ML IJ SOLN: 5.0000 mg | Freq: Three times a day (TID) | INTRAMUSCULAR | Status: DC | PRN

## 2022-04-22 MED ORDER — SODIUM CHLORIDE 0.9 % IV SOLN: INTRAVENOUS | Status: DC

## 2022-04-22 MED ORDER — APIXABAN 2.5 MG PO TABS
2.5000 mg | ORAL_TABLET | Freq: Two times a day (BID) | ORAL | Status: DC
  Administered 2022-04-23: 2.5 mg via ORAL
  Filled 2022-04-22: qty 1

## 2022-04-22 MED ORDER — PANTOPRAZOLE SODIUM 40 MG PO TBEC
40.0000 mg | DELAYED_RELEASE_TABLET | Freq: Every day | ORAL | Status: DC
  Administered 2022-04-22 – 2022-04-23 (×2): 40 mg via ORAL
  Filled 2022-04-22 (×2): qty 1

## 2022-04-22 MED ORDER — POVIDONE-IODINE 10 % EX SWAB: 2.0000 | Freq: Once | CUTANEOUS | Status: DC

## 2022-04-22 MED ORDER — BISACODYL 10 MG RE SUPP: 10.0000 mg | Freq: Every day | RECTAL | Status: DC | PRN

## 2022-04-22 MED ORDER — ACETAMINOPHEN 500 MG PO TABS
1000.0000 mg | ORAL_TABLET | Freq: Once | ORAL | Status: AC
  Administered 2022-04-22: 1000 mg via ORAL
  Filled 2022-04-22: qty 2

## 2022-04-22 MED ORDER — BUPIVACAINE-EPINEPHRINE (PF) 0.5% -1:200000 IJ SOLN
INTRAMUSCULAR | Status: DC | PRN
  Administered 2022-04-22: 25 mL via PERINEURAL

## 2022-04-22 MED ORDER — LABETALOL HCL 5 MG/ML IV SOLN
INTRAVENOUS | Status: DC | PRN
  Administered 2022-04-22: 5 mg via INTRAVENOUS

## 2022-04-22 MED ORDER — POVIDONE-IODINE 10 % EX SWAB
2.0000 | Freq: Once | CUTANEOUS | Status: AC
  Administered 2022-04-22: 2 via TOPICAL

## 2022-04-22 MED ORDER — SODIUM CHLORIDE 0.9% IV SOLUTION
INTRAVENOUS | Status: AC | PRN
  Administered 2022-04-22: 1000 mL via INTRAMUSCULAR

## 2022-04-22 MED ORDER — KETOROLAC TROMETHAMINE 30 MG/ML IJ SOLN
INTRAMUSCULAR | Status: DC | PRN
  Administered 2022-04-22: 30 mg via INTRAVENOUS

## 2022-04-22 MED ORDER — FENTANYL CITRATE PF 50 MCG/ML IJ SOSY: 25.0000 ug | PREFILLED_SYRINGE | INTRAMUSCULAR | Status: DC | PRN

## 2022-04-22 MED ORDER — CELECOXIB 200 MG PO CAPS
200.0000 mg | ORAL_CAPSULE | Freq: Two times a day (BID) | ORAL | Status: DC
Start: 2022-04-22 — End: 2022-04-23
  Administered 2022-04-22 – 2022-04-23 (×3): 200 mg via ORAL
  Filled 2022-04-22 (×3): qty 1

## 2022-04-22 MED ORDER — METOCLOPRAMIDE HCL 5 MG PO TABS: 5.0000 mg | ORAL_TABLET | Freq: Three times a day (TID) | ORAL | Status: DC | PRN

## 2022-04-22 MED ORDER — STERILE WATER FOR IRRIGATION IR SOLN
Status: DC | PRN
  Administered 2022-04-22: 2000 mL

## 2022-04-22 MED ORDER — LACTATED RINGERS IV SOLN: INTRAVENOUS | Status: DC

## 2022-04-22 MED ORDER — EPHEDRINE SULFATE (PRESSORS) 50 MG/ML IJ SOLN
INTRAMUSCULAR | Status: DC | PRN
  Administered 2022-04-22: 5 mg via INTRAVENOUS

## 2022-04-22 MED ORDER — FENTANYL CITRATE (PF) 100 MCG/2ML IJ SOLN
INTRAMUSCULAR | Status: DC | PRN
Start: 2022-04-22 — End: 2022-04-22
  Administered 2022-04-22 (×2): 50 ug via INTRAVENOUS

## 2022-04-22 MED ORDER — UMECLIDINIUM-VILANTEROL 62.5-25 MCG/ACT IN AEPB
1.0000 | INHALATION_SPRAY | Freq: Every day | RESPIRATORY_TRACT | Status: DC
Start: 2022-04-22 — End: 2022-04-23
  Filled 2022-04-22: qty 14

## 2022-04-22 MED ORDER — OXYCODONE HCL 5 MG PO TABS
5.0000 mg | ORAL_TABLET | ORAL | Status: DC | PRN
  Administered 2022-04-23: 10 mg via ORAL
  Filled 2022-04-22: qty 2

## 2022-04-22 MED ORDER — DOCUSATE SODIUM 100 MG PO CAPS
100.0000 mg | ORAL_CAPSULE | Freq: Two times a day (BID) | ORAL | Status: DC
  Administered 2022-04-22 – 2022-04-23 (×3): 100 mg via ORAL
  Filled 2022-04-22 (×3): qty 1

## 2022-04-22 MED ORDER — OXYCODONE HCL 5 MG/5ML PO SOLN: 5.0000 mg | Freq: Once | ORAL | Status: DC | PRN

## 2022-04-22 MED ORDER — ACETAMINOPHEN 160 MG/5ML PO SOLN: 1000.0000 mg | Freq: Once | ORAL | Status: DC | PRN

## 2022-04-22 MED ORDER — PHENYLEPHRINE HCL-NACL 20-0.9 MG/250ML-% IV SOLN
INTRAVENOUS | Status: AC
  Filled 2022-04-22: qty 500

## 2022-04-22 MED ORDER — CHLORHEXIDINE GLUCONATE 0.12 % MT SOLN
15.0000 mL | Freq: Once | OROMUCOSAL | Status: AC
  Administered 2022-04-22: 15 mL via OROMUCOSAL

## 2022-04-22 MED ORDER — CEFAZOLIN SODIUM-DEXTROSE 2-4 GM/100ML-% IV SOLN
2.0000 g | INTRAVENOUS | Status: AC
  Administered 2022-04-22: 2 g via INTRAVENOUS
  Filled 2022-04-22: qty 100

## 2022-04-22 MED ORDER — ONDANSETRON HCL 4 MG/2ML IJ SOLN
INTRAMUSCULAR | Status: DC | PRN
  Administered 2022-04-22: 4 mg via INTRAVENOUS

## 2022-04-22 MED ORDER — SODIUM CHLORIDE 0.9% FLUSH
INTRAVENOUS | Status: DC | PRN
  Administered 2022-04-22: 30 mL

## 2022-04-22 MED ORDER — ORAL CARE MOUTH RINSE: 15.0000 mL | Freq: Once | OROMUCOSAL | Status: AC

## 2022-04-22 MED ORDER — METHOCARBAMOL 1000 MG/10ML IJ SOLN
500.0000 mg | Freq: Four times a day (QID) | INTRAVENOUS | Status: DC | PRN
  Filled 2022-04-22: qty 5

## 2022-04-22 MED ORDER — HYDROCHLOROTHIAZIDE 12.5 MG PO TABS
12.5000 mg | ORAL_TABLET | Freq: Every day | ORAL | Status: DC
  Administered 2022-04-23: 12.5 mg via ORAL
  Filled 2022-04-22: qty 1

## 2022-04-22 MED ORDER — VANCOMYCIN HCL IN DEXTROSE 1-5 GM/200ML-% IV SOLN
1000.0000 mg | Freq: Once | INTRAVENOUS | Status: AC
  Administered 2022-04-22: 1000 mg via INTRAVENOUS
  Filled 2022-04-22: qty 200

## 2022-04-22 MED ORDER — ACETAMINOPHEN 10 MG/ML IV SOLN: 1000.0000 mg | Freq: Once | INTRAVENOUS | Status: DC | PRN

## 2022-04-22 MED ORDER — METHOCARBAMOL 500 MG PO TABS
500.0000 mg | ORAL_TABLET | Freq: Four times a day (QID) | ORAL | Status: DC | PRN
  Administered 2022-04-23: 500 mg via ORAL
  Filled 2022-04-22: qty 1

## 2022-04-22 MED ORDER — TRANEXAMIC ACID-NACL 1000-0.7 MG/100ML-% IV SOLN
1000.0000 mg | INTRAVENOUS | Status: AC
  Administered 2022-04-22: 1000 mg via INTRAVENOUS
  Filled 2022-04-22: qty 100

## 2022-04-22 MED ORDER — BUPIVACAINE IN DEXTROSE 0.75-8.25 % IT SOLN
INTRATHECAL | Status: DC | PRN
  Administered 2022-04-22: 1.6 mL via INTRATHECAL

## 2022-04-22 MED ORDER — ONDANSETRON HCL 4 MG PO TABS: 4.0000 mg | ORAL_TABLET | Freq: Four times a day (QID) | ORAL | Status: DC | PRN

## 2022-04-22 MED ORDER — BISOPROLOL FUMARATE 5 MG PO TABS
10.0000 mg | ORAL_TABLET | Freq: Every day | ORAL | Status: DC
  Administered 2022-04-23: 10 mg via ORAL
  Filled 2022-04-22: qty 2

## 2022-04-22 MED ORDER — ACETAMINOPHEN 500 MG PO TABS: 1000.0000 mg | ORAL_TABLET | Freq: Once | ORAL | Status: DC | PRN

## 2022-04-22 MED ORDER — MENTHOL 3 MG MT LOZG: 1.0000 | LOZENGE | OROMUCOSAL | Status: DC | PRN

## 2022-04-22 MED ORDER — OXYCODONE HCL 5 MG PO TABS
10.0000 mg | ORAL_TABLET | ORAL | Status: DC | PRN
  Administered 2022-04-23: 10 mg via ORAL
  Filled 2022-04-22: qty 2

## 2022-04-22 MED ORDER — PHENYLEPHRINE HCL-NACL 20-0.9 MG/250ML-% IV SOLN
INTRAVENOUS | Status: DC | PRN
  Administered 2022-04-22: 30 ug/min via INTRAVENOUS

## 2022-04-22 MED ORDER — POLYETHYLENE GLYCOL 3350 17 G PO PACK: 17.0000 g | PACK | Freq: Every day | ORAL | Status: DC | PRN

## 2022-04-22 MED ORDER — ALUM & MAG HYDROXIDE-SIMETH 200-200-20 MG/5ML PO SUSP: 30.0000 mL | ORAL | Status: DC | PRN

## 2022-04-22 MED ORDER — ONDANSETRON HCL 4 MG/2ML IJ SOLN: 4.0000 mg | Freq: Four times a day (QID) | INTRAMUSCULAR | Status: DC | PRN

## 2022-04-22 MED ORDER — PROPOFOL 500 MG/50ML IV EMUL
INTRAVENOUS | Status: DC | PRN
  Administered 2022-04-22: 75 ug/kg/min via INTRAVENOUS

## 2022-04-22 MED ORDER — DIPHENHYDRAMINE HCL 12.5 MG/5ML PO ELIX: 12.5000 mg | ORAL_SOLUTION | ORAL | Status: DC | PRN

## 2022-04-22 MED ORDER — ISOPROPYL ALCOHOL 70 % SOLN
Status: DC | PRN
  Administered 2022-04-22: 1 via TOPICAL

## 2022-04-22 MED ORDER — FENTANYL CITRATE (PF) 100 MCG/2ML IJ SOLN
INTRAMUSCULAR | Status: AC
  Filled 2022-04-22: qty 2

## 2022-04-22 MED ORDER — PHENYLEPHRINE HCL (PRESSORS) 10 MG/ML IV SOLN
INTRAVENOUS | Status: DC | PRN
  Administered 2022-04-22: 160 ug via INTRAVENOUS

## 2022-04-22 MED ORDER — PHENOL 1.4 % MT LIQD: 1.0000 | OROMUCOSAL | Status: DC | PRN

## 2022-04-22 SURGICAL SUPPLY — 77 items
ADH SKN CLS APL DERMABOND .7 (GAUZE/BANDAGES/DRESSINGS) ×2
APL PRP STRL LF DISP 70% ISPRP (MISCELLANEOUS) ×2
BAG COUNTER SPONGE SURGICOUNT (BAG) IMPLANT
BAG SPEC THK2 15X12 ZIP CLS (MISCELLANEOUS)
BAG SPNG CNTER NS LX DISP (BAG)
BAG ZIPLOCK 12X15 (MISCELLANEOUS) IMPLANT
BANDAGE ACE 4X5 VEL STRL LF (GAUZE/BANDAGES/DRESSINGS) ×1 IMPLANT
BANDAGE ACE 6X5 VEL STRL LF (GAUZE/BANDAGES/DRESSINGS) ×1 IMPLANT
BATTERY INSTRU NAVIGATION (MISCELLANEOUS) ×6 IMPLANT
BLADE SAW RECIPROCATING 77.5 (BLADE) ×2 IMPLANT
BNDG ELASTIC 4X5.8 VLCR STR LF (GAUZE/BANDAGES/DRESSINGS) ×2 IMPLANT
BNDG ELASTIC 6X5.8 VLCR STR LF (GAUZE/BANDAGES/DRESSINGS) ×2 IMPLANT
BTRY SRG DRVR LF (MISCELLANEOUS) ×3
CHLORAPREP W/TINT 26 (MISCELLANEOUS) ×4 IMPLANT
COMP FEM KNEE PS STD 5 LT (Joint) ×1 IMPLANT
COMPONENT FEM KNEE PS STD 5 LT (Joint) IMPLANT
COVER SURGICAL LIGHT HANDLE (MISCELLANEOUS) ×2 IMPLANT
DERMABOND ADVANCED (GAUZE/BANDAGES/DRESSINGS) ×2
DERMABOND ADVANCED .7 DNX12 (GAUZE/BANDAGES/DRESSINGS) ×4 IMPLANT
DRAPE INCISE IOBAN 66X45 STRL (DRAPES) ×2 IMPLANT
DRAPE SHEET LG 3/4 BI-LAMINATE (DRAPES) ×6 IMPLANT
DRAPE U-SHAPE 47X51 STRL (DRAPES) ×2 IMPLANT
DRSG AQUACEL AG ADV 3.5X10 (GAUZE/BANDAGES/DRESSINGS) ×2 IMPLANT
ELECT BLADE TIP CTD 4 INCH (ELECTRODE) ×2 IMPLANT
ELECT REM PT RETURN 15FT ADLT (MISCELLANEOUS) ×2 IMPLANT
GAUZE SPONGE 4X4 12PLY STRL (GAUZE/BANDAGES/DRESSINGS) ×2 IMPLANT
GLOVE BIO SURGEON STRL SZ7 (GLOVE) ×2 IMPLANT
GLOVE BIO SURGEON STRL SZ8.5 (GLOVE) ×4 IMPLANT
GLOVE BIOGEL PI IND STRL 7.5 (GLOVE) ×2 IMPLANT
GLOVE BIOGEL PI IND STRL 8.5 (GLOVE) ×2 IMPLANT
GLOVE BIOGEL PI INDICATOR 7.5 (GLOVE) ×1
GLOVE BIOGEL PI INDICATOR 8.5 (GLOVE) ×1
GOWN SPEC L3 XXLG W/TWL (GOWN DISPOSABLE) ×2 IMPLANT
GOWN STRL REUS W/ TWL XL LVL3 (GOWN DISPOSABLE) ×2 IMPLANT
GOWN STRL REUS W/TWL XL LVL3 (GOWN DISPOSABLE) ×1
HANDPIECE INTERPULSE COAX TIP (DISPOSABLE) ×1
HDLS TROCR DRIL PIN KNEE 75 (PIN) ×4
HOLDER FOLEY CATH W/STRAP (MISCELLANEOUS) ×2 IMPLANT
HOOD PEEL AWAY FLYTE STAYCOOL (MISCELLANEOUS) ×6 IMPLANT
IMPL PATELLA METAL SZ32X10 (Joint) ×1 IMPLANT
KIT TURNOVER KIT A (KITS) IMPLANT
LINER TIB ASH PS CD/3-9 11 LT (Liner) ×1 IMPLANT
MARKER SKIN DUAL TIP RULER LAB (MISCELLANEOUS) ×2 IMPLANT
NDL SAFETY ECLIPSE 18X1.5 (NEEDLE) ×2 IMPLANT
NDL SPNL 18GX3.5 QUINCKE PK (NEEDLE) ×2 IMPLANT
NEEDLE HYPO 18GX1.5 SHARP (NEEDLE) ×1
NEEDLE SPNL 18GX3.5 QUINCKE PK (NEEDLE) ×1
NS IRRIG 1000ML POUR BTL (IV SOLUTION) ×2 IMPLANT
PACK TOTAL KNEE CUSTOM (KITS) ×2 IMPLANT
PADDING CAST COTTON 6X4 STRL (CAST SUPPLIES) ×2 IMPLANT
PIN DRILL HDLS TROCAR 75 4PK (PIN) ×4 IMPLANT
PROTECTOR NERVE ULNAR (MISCELLANEOUS) ×2 IMPLANT
Persona cruciate Retaining CR left (Femur) ×1 IMPLANT
SAW OSC TIP CART 19.5X105X1.3 (SAW) ×2 IMPLANT
SCREW FEMALE HEX FIX 25X2.5 (ORTHOPEDIC DISPOSABLE SUPPLIES) ×1 IMPLANT
SEALER BIPOLAR AQUA 6.0 (INSTRUMENTS) ×2 IMPLANT
SET HNDPC FAN SPRY TIP SCT (DISPOSABLE) ×2 IMPLANT
SET PAD KNEE POSITIONER (MISCELLANEOUS) ×2 IMPLANT
SOLUTION PRONTOSAN WOUND 350ML (IRRIGATION / IRRIGATOR) IMPLANT
SPIKE FLUID TRANSFER (MISCELLANEOUS) ×4 IMPLANT
STAPLER VISISTAT 35W (STAPLE) ×1 IMPLANT
STEM TIB PS KNEE D 0D LT (Stem) ×1 IMPLANT
SUT MNCRL AB 3-0 PS2 18 (SUTURE) ×2 IMPLANT
SUT MNCRL AB 4-0 PS2 18 (SUTURE) ×1 IMPLANT
SUT MON AB 2-0 CT1 36 (SUTURE) ×2 IMPLANT
SUT STRATAFIX PDO 1 14 VIOLET (SUTURE) ×1
SUT STRATFX PDO 1 14 VIOLET (SUTURE) ×1
SUT VIC AB 1 CTX 36 (SUTURE) ×2
SUT VIC AB 1 CTX36XBRD ANBCTR (SUTURE) ×4 IMPLANT
SUT VIC AB 2-0 CT1 27 (SUTURE) ×1
SUT VIC AB 2-0 CT1 TAPERPNT 27 (SUTURE) ×2 IMPLANT
SUTURE STRATFX PDO 1 14 VIOLET (SUTURE) ×1 IMPLANT
TRAY FOLEY MTR SLVR 14FR STAT (SET/KITS/TRAYS/PACK) ×1 IMPLANT
TRAY FOLEY MTR SLVR 16FR STAT (SET/KITS/TRAYS/PACK) IMPLANT
TUBE SUCTION HIGH CAP CLEAR NV (SUCTIONS) ×2 IMPLANT
WATER STERILE IRR 1000ML POUR (IV SOLUTION) ×4 IMPLANT
WRAP KNEE MAXI GEL POST OP (GAUZE/BANDAGES/DRESSINGS) ×1 IMPLANT

## 2022-04-22 NOTE — Op Note (Signed)
OPERATIVE REPORT  SURGEON: Rod Can, MD   ASSISTANT: Larene Pickett, PA-C  PREOPERATIVE DIAGNOSIS: Primary Left knee arthritis.   POSTOPERATIVE DIAGNOSIS: Primary Left knee arthritis.   PROCEDURE: Computer assisted Left total knee arthroplasty.   IMPLANTS: Zimmer Persona PPS Cementless CR femur, size 5. Persona 0 degree Spiked Keel OsseoTi Tibia, size D. Vivacit-E polyethelyene insert, size 11 mm, CR. TM standard patella, size 32 mm.  ANESTHESIA:  MAC, Regional, and Spinal  TOURNIQUET TIME: Not utilized.   ESTIMATED BLOOD LOSS:-10 mL    ANTIBIOTICS: 2g Ancef. 1g vancomycin (h/o MRSA).  DRAINS: None.  COMPLICATIONS: None   CONDITION: PACU - hemodynamically stable.   BRIEF CLINICAL NOTE: Heather Ellis is a 76 y.o. female with a long-standing history of Left knee arthritis. After failing conservative management, the patient was indicated for total knee arthroplasty. The risks, benefits, and alternatives to the procedure were explained, and the patient elected to proceed.  PROCEDURE IN DETAIL: Adductor canal block was obtained in the pre-op holding area. Once inside the operative room, spinal anesthesia was obtained, and a foley catheter was inserted. The patient was then positioned and the lower extremity was prepped and draped in the normal sterile surgical fashion.  A time-out was called verifying side and site of surgery. The patient received IV antibiotics within 60 minutes of beginning the procedure. A tourniquet was not utilized.   An anterior approach to the knee was performed utilizing a midvastus arthrotomy. A medial release was performed and the patellar fat pad was excised. Stryker imageless navigation was used to cut the distal femur perpendicular to the mechanical axis. A freehand patellar resection was performed, and the patella was sized an prepared with 3 lug holes.  Nagivation was used to make a neutral proximal tibia resection, taking 9 mm of bone from the  less affected lateral side with 3 degrees of slope. The menisci were excised. A spacer block was placed, and the alignment and balance in extension were confirmed.   The distal femur was sized using the 3-degree external rotation guide referencing the posterior femoral cortex. The appropriate 4-in-1 cutting block was pinned into place. Rotation was checked using Whiteside's line, the epicondylar axis, and then confirmed with a spacer block in flexion. The remaining femoral cuts were performed, taking care to protect the MCL.  The tibia was sized and the trial tray was pinned into place. The remaining trail components were inserted. The knee was stable to varus and valgus stress through a full range of motion. The patella tracked centrally, and the PCL was well balanced. The trial components were removed, and the proximal tibial surface was prepared. Final components were impacted into place. The knee was tested for a final time and found to be well balanced.   The wound was copiously irrigated with Prontosan solution and normal saline using pule lavage.  Marcaine solution was injected into the periarticular soft tissue.  The wound was closed in layers using #1 Vicryl and Stratafix for the fascia, 2-0 Vicryl for the subcutaneous fat, 2-0 Monocryl for the deep dermal layer, 3-0 running Monocryl subcuticular Stitch, and 4-0 Monocryl stay sutures at both ends of the wound. Dermabond was applied to the skin.  Once the glue was fully dried, an Aquacell Ag and compressive dressing were applied.  The patient was transported to the recovery room in stable condition.  Sponge, needle, and instrument counts were correct at the end of the case x2.  The patient tolerated the procedure well and there  were no known complications.  Please note that a surgical assistant was a medical necessity for this procedure in order to perform it in a safe and expeditious manner. Surgical assistant was necessary to retract the ligaments  and vital neurovascular structures to prevent injury to them and also necessary for proper positioning of the limb to allow for anatomic placement of the prosthesis.

## 2022-04-22 NOTE — Discharge Instructions (Addendum)
Dr. Samson Frederic Total Joint Specialist [facility] [address] [place], [place] [address] [phone]  TOTAL KNEE REPLACEMENT POSTOPERATIVE DIRECTIONS    Knee Rehabilitation, Guidelines Following Surgery  Results after knee surgery are often greatly improved when you follow the exercise, range of motion and muscle strengthening exercises prescribed by your doctor. Safety measures are also important to protect the knee from further injury. Any time any of these exercises cause you to have increased pain or swelling in your knee joint, decrease the amount until you are comfortable again and slowly increase them. If you have problems or questions, call your caregiver or physical therapist for advice.   WEIGHT BEARING Weight bearing as tolerated with assist device (walker, cane, etc) as directed, use it as long as suggested by your surgeon or therapist, typically at least 4-6 weeks.  HOME CARE INSTRUCTIONS  Remove items at home which could result in a fall. This includes throw rugs or furniture in walking pathways.  Continue medications as instructed at time of discharge. You may have some home medications which will be placed on hold until you complete the course of blood thinner medication.  You may start showering once you are discharged home but do not submerge the incision under water. Just pat the incision dry and apply a dry gauze dressing on daily. Walk with walker as instructed.  You may resume a sexual relationship in one month or when given the OK by your doctor.  Use walker as long as suggested by your caregivers. Avoid periods of inactivity such as sitting longer than an hour when not asleep. This helps prevent blood clots.  You may put full weight on your legs and walk as much as is comfortable.  You may return to work once you are cleared by your doctor.  Do not drive a car for 6 weeks or until released by you surgeon.  Do not drive while  taking narcotics.  Wear the elastic stockings for three weeks following surgery during the day but you may remove then at night. Make sure you keep all of your appointments after your operation with all of your doctors and caregivers. You should call the office at the above phone number and make an appointment for approximately two weeks after the date of your surgery. Do not remove your surgical dressing. The dressing is waterproof; you may take showers in 3 days, but do not take tub baths or submerge the dressing. Please pick up a stool softener and laxative for home use as long as you are requiring pain medications. ICE to the affected knee every three hours for 30 minutes at a time and then as needed for pain and swelling.  Continue to use ice on the knee for pain and swelling from surgery. You may notice swelling that will progress down to the foot and ankle.  This is normal after surgery.  Elevate the leg when you are not up walking on it.   It is important for you to complete the blood thinner medication as prescribed by your doctor. Continue to use the breathing machine which will help keep your temperature down.  It is common for your temperature to cycle up and down following surgery, especially at night when you are not up moving around and exerting yourself.  The breathing machine keeps your lungs expanded and your temperature down.  RANGE OF MOTION AND STRENGTHENING EXERCISES  Rehabilitation of the knee is important following a knee injury or an  operation. After just a few days of immobilization, the muscles of the thigh which control the knee become weakened and shrink (atrophy). Knee exercises are designed to build up the tone and strength of the thigh muscles and to improve knee motion. Often times heat used for twenty to thirty minutes before working out will loosen up your tissues and help with improving the range of motion but do not use heat for the first two weeks following surgery.  These exercises can be done on a training (exercise) mat, on the floor, on a table or on a bed. Use what ever works the best and is most comfortable for you Knee exercises include:  Leg Lifts - While your knee is still immobilized in a splint or cast, you can do straight leg raises. Lift the leg to 60 degrees, hold for 3 sec, and slowly lower the leg. Repeat 10-20 times 2-3 times daily. Perform this exercise against resistance later as your knee gets better.  Quad and Hamstring Sets - Tighten up the muscle on the front of the thigh (Quad) and hold for 5-10 sec. Repeat this 10-20 times hourly. Hamstring sets are done by pushing the foot backward against an object and holding for 5-10 sec. Repeat as with quad sets.  A rehabilitation program following serious knee injuries can speed recovery and prevent re-injury in the future due to weakened muscles. Contact your doctor or a physical therapist for more information on knee rehabilitation.   POST-OPERATIVE OPIOID TAPER INSTRUCTIONS: It is important to wean off of your opioid medication as soon as possible. If you do not need pain medication after your surgery it is ok to stop day one. Opioids include: Codeine, Hydrocodone(Norco, Vicodin), Oxycodone(Percocet, oxycontin) and hydromorphone amongst others.  Long term and even short term use of opiods can cause: Increased pain response Dependence Constipation Depression Respiratory depression And more.  Withdrawal symptoms can include Flu like symptoms Nausea, vomiting And more Techniques to manage these symptoms Hydrate well Eat regular healthy meals Stay active Use relaxation techniques(deep breathing, meditating, yoga) Do Not substitute Alcohol to help with tapering If you have been on opioids for less than two weeks and do not have pain than it is ok to stop all together.  Plan to wean off of opioids This plan should start within one week post op of your joint replacement. Maintain the same  interval or time between taking each dose and first decrease the dose.  Cut the total daily intake of opioids by one tablet each day Next start to increase the time between doses. The last dose that should be eliminated is the evening dose.    SKILLED REHAB INSTRUCTIONS: If the patient is transferred to a skilled rehab facility following release from the hospital, a list of the current medications will be sent to the facility for the patient to continue.  When discharged from the skilled rehab facility, please have the facility set up the patient's Home Health Physical Therapy prior to being released. Also, the skilled facility will be responsible for providing the patient with their medications at time of release from the facility to include their pain medication, the muscle relaxants, and their blood thinner medication. If the patient is still at the rehab facility at time of the two week follow up appointment, the skilled rehab facility will also need to assist the patient in arranging follow up appointment in our office and any transportation needs.  MAKE SURE YOU:  Understand these instructions.  Will watch  your condition.  Will get help right away if you are not doing well or get worse.    Pick up stool softner and laxative for home use following surgery while on pain medications. Do NOT remove your dressing. You may shower.  Do not take tub baths or submerge incision under water. May shower starting three days after surgery. Please use a clean towel to pat the incision dry following showers. Continue to use ice for pain and swelling after surgery. Do not use any lotions or creams on the incision until instructed by your surgeon. Please make sure you are taking your daily blood pressure medications at home.   Information on my medicine - ELIQUIS (apixaban)  This medication education was reviewed with me or my healthcare representative as part of my discharge preparation.  The  pharmacist that spoke with me during my hospital stay was:    Why was Eliquis prescribed for you? Eliquis was prescribed for you to reduce the risk of blood clots forming after orthopedic surgery.    What do You need to know about Eliquis? Take your Eliquis TWICE DAILY - one tablet in the morning and one tablet in the evening with or without food.  It would be best to take the dose about the same time each day.  If you have difficulty swallowing the tablet whole please discuss with your pharmacist how to take the medication safely.  Take Eliquis exactly as prescribed by your doctor and DO NOT stop taking Eliquis without talking to the doctor who prescribed the medication.  Stopping without other medication to take the place of Eliquis may increase your risk of developing a clot.  After discharge, you should have regular check-up appointments with your healthcare provider that is prescribing your Eliquis.  What do you do if you miss a dose? If a dose of ELIQUIS is not taken at the scheduled time, take it as soon as possible on the same day and twice-daily administration should be resumed.  The dose should not be doubled to make up for a missed dose.  Do not take more than one tablet of ELIQUIS at the same time.  Important Safety Information A possible side effect of Eliquis is bleeding. You should call your healthcare provider right away if you experience any of the following: Bleeding from an injury or your nose that does not stop. Unusual colored urine (red or dark brown) or unusual colored stools (red or black). Unusual bruising for unknown reasons. A serious fall or if you hit your head (even if there is no bleeding).  Some medicines may interact with Eliquis and might increase your risk of bleeding or clotting while on Eliquis. To help avoid this, consult your healthcare provider or pharmacist prior to using any new prescription or non-prescription medications, including  herbals, vitamins, non-steroidal anti-inflammatory drugs (NSAIDs) and supplements.  This website has more information on Eliquis (apixaban): http://www.eliquis.com/eliquis/home

## 2022-04-22 NOTE — Interval H&P Note (Signed)
History and Physical Interval Note:  04/22/2022 6:26 AM  Heather Ellis  has presented today for surgery, with the diagnosis of Left knee osteoarthritis.  The various methods of treatment have been discussed with the patient and family. After consideration of risks, benefits and other options for treatment, the patient has consented to  Procedure(s): COMPUTER ASSISTED TOTAL KNEE ARTHROPLASTY (Left) as a surgical intervention.  The patient's history has been reviewed, patient examined, no change in status, stable for surgery.  I have reviewed the patient's chart and labs.  Questions were answered to the patient's satisfaction.    The risks, benefits, and alternatives were discussed with the patient. There are risks associated with the surgery including, but not limited to, problems with anesthesia (death), infection, instability (giving out of the joint), dislocation, differences in leg length/angulation/rotation, fracture of bones, loosening or failure of implants, hematoma (blood accumulation) which may require surgical drainage, blood clots, pulmonary embolism, nerve injury (foot drop and lateral thigh numbness), and blood vessel injury. The patient understands these risks and elects to proceed.    Hilton Cork Nyaira Hodgens

## 2022-04-22 NOTE — Anesthesia Preprocedure Evaluation (Addendum)
Anesthesia Evaluation  Patient identified by MRN, date of birth, ID band Patient awake    Reviewed: Allergy & Precautions, NPO status , Patient's Chart, lab work & pertinent test results  History of Anesthesia Complications Negative for: history of anesthetic complications  Airway Mallampati: I  TM Distance: >3 FB Neck ROM: Full    Dental  (+) Edentulous Upper, Edentulous Lower, Dental Advisory Given   Pulmonary shortness of breath, asthma , COPD, former smoker,    breath sounds clear to auscultation       Cardiovascular hypertension, Pt. on medications and Pt. on home beta blockers (-) angina(-) Past MI and (-) CHF + Valvular Problems/Murmurs MR and AI  Rhythm:Regular Rate:Bradycardia  Left ventricle: The cavity size was normal. Wall thickness was  normal. Systolic function was normal. The estimated ejection  fraction was in the range of 55% to 60%. Wall motion was normal;  there were no regional wall motion abnormalities. Doppler  parameters are consistent with abnormal left ventricular  relaxation (grade 1 diastolic dysfunction). Doppler parameters  are consistent with high ventricular filling pressure.  - Aortic valve: There was trivial regurgitation.  - Mitral valve: Calcified annulus. Prolapse, involving the  posterior leaflet. There was mild to moderate regurgitation.  - Pulmonary arteries: PA peak pressure: 31 mm Hg (S).    Neuro/Psych neg Seizures negative psych ROS   GI/Hepatic Neg liver ROS, GERD  Medicated and Controlled,  Endo/Other  negative endocrine ROS  Renal/GU negative Renal ROSLab Results      Component                Value               Date                      CREATININE               0.65                03/26/2022                Musculoskeletal  (+) Arthritis ,   Abdominal   Peds  Hematology  (+) Blood dyscrasia, anemia , Lab Results      Component                Value                Date                      WBC                      4.6                 03/26/2022                HGB                      10.7 (L)            03/26/2022                HCT                      33.5 (L)            03/26/2022                MCV  92.8                03/26/2022                PLT                      197                 03/26/2022            Lab Results      Component                Value               Date                      INR                      1.1 (H)             04/04/2021                INR                      1.04                10/10/2018            Denies blood thinners   Anesthesia Other Findings   Reproductive/Obstetrics                            Anesthesia Physical Anesthesia Plan  ASA: 2  Anesthesia Plan: MAC, Regional and Spinal   Post-op Pain Management: Tylenol PO (pre-op)*   Induction: Intravenous  PONV Risk Score and Plan: 2 and Propofol infusion and Treatment may vary due to age or medical condition  Airway Management Planned: Nasal Cannula and Natural Airway  Additional Equipment: None  Intra-op Plan:   Post-operative Plan:   Informed Consent: I have reviewed the patients History and Physical, chart, labs and discussed the procedure including the risks, benefits and alternatives for the proposed anesthesia with the patient or authorized representative who has indicated his/her understanding and acceptance.     Dental advisory given  Plan Discussed with: CRNA  Anesthesia Plan Comments:         Anesthesia Quick Evaluation

## 2022-04-22 NOTE — Transfer of Care (Signed)
Immediate Anesthesia Transfer of Care Note  Patient: Heather Ellis  Procedure(s) Performed: COMPUTER ASSISTED TOTAL KNEE ARTHROPLASTY (Left: Knee)  Patient Location: PACU  Anesthesia Type:Spinal  Level of Consciousness: sedated, patient cooperative and responds to stimulation  Airway & Oxygen Therapy: Patient Spontanous Breathing and Patient connected to face mask oxygen  Post-op Assessment: Report given to RN and Post -op Vital signs reviewed and stable  Post vital signs: Reviewed and stable  Last Vitals:  Vitals Value Taken Time  BP 79/50 04/22/22 1002  Temp    Pulse 54 04/22/22 1007  Resp 19 04/22/22 1007  SpO2 96 % 04/22/22 1007  Vitals shown include unvalidated device data.  Last Pain:  Vitals:   04/22/22 0545  TempSrc: Oral  PainSc:          Complications: No notable events documented.

## 2022-04-22 NOTE — Evaluation (Signed)
Physical Therapy Evaluation Patient Details Name: Heather Ellis MRN: [id number] DOB: [birth year] Today's Date: 04/22/2022  History of Present Illness  76 yo female s/p L TKA on 04/22/22. PMH: RA, L THA, L rTSA, covid, COPD  Clinical Impression  Pt is s/p TKA resulting in the deficits listed below (see PT Problem List).  Pt doing very well, denies pain, amb ~ 60' with RW and min/guard assist. Anticipate steady progress.  Pt will benefit from skilled PT to increase their independence and safety with mobility to allow discharge to the venue listed below.         Recommendations for follow up therapy are one component of a multi-disciplinary discharge planning process, led by the attending physician.  Recommendations may be updated based on patient status, additional functional criteria and insurance authorization.  Follow Up Recommendations Follow physician's recommendations for discharge plan and follow up therapies      Assistance Recommended at Discharge Intermittent Supervision/Assistance  Patient can return home with the following  A little help with walking and/or transfers;A little help with bathing/dressing/bathroom;Help with stairs or ramp for entrance;Assist for transportation    Equipment Recommendations None recommended by PT  Recommendations for Other Services       Functional Status Assessment Patient has had a recent decline in their functional status and demonstrates the ability to make significant improvements in function in a reasonable and predictable amount of time.     Precautions / Restrictions Precautions Precautions: Knee;Fall Restrictions Weight Bearing Restrictions: No Other Position/Activity Restrictions: WBAT      Mobility  Bed Mobility Overal bed mobility: Needs Assistance Bed Mobility: Supine to Sit     Supine to sit: Min guard     General bed mobility comments: cues for sequence, min/guard for safety    Transfers Overall transfer  level: Needs assistance Equipment used: Rolling walker (2 wheels) Transfers: Sit to/from Stand Sit to Stand: Min guard, Min assist           General transfer comment: cues for hand placement    Ambulation/Gait Ambulation/Gait assistance: Min guard Gait Distance (Feet): 65 Feet Assistive device: Rolling walker (2 wheels) Gait Pattern/deviations: Step-to pattern, Step-through pattern       General Gait Details: cues for sequence and RW position. good stability, no knee buckling  Stairs            Wheelchair Mobility    Modified Rankin (Stroke Patients Only)       Balance                                             Pertinent Vitals/Pain Pain Assessment Pain Assessment: No/denies pain    Home Living Family/patient expects to be discharged to:: Private residence Living Arrangements: Children                 Additional Comments: dtr staying until Sundday    Prior Function Prior Level of Function : Independent/Modified Independent             Mobility Comments: amb with cane at times       Hand Dominance        Extremity/Trunk Assessment   Upper Extremity Assessment Upper Extremity Assessment: Overall WFL for tasks assessed    Lower Extremity Assessment Lower Extremity Assessment: LLE deficits/detail LLE Deficits / Details: ankle WFL, knee and hip grossly 2+/5  Communication   Communication: No difficulties  Cognition Arousal/Alertness: Awake/alert Behavior During Therapy: WFL for tasks assessed/performed Overall Cognitive Status: Within Functional Limits for tasks assessed                                          General Comments      Exercises Total Joint Exercises Ankle Circles/Pumps: AROM, Both Straight Leg Raises: AROM, 5 reps   Assessment/Plan    PT Assessment Patient needs continued PT services  PT Problem List Decreased strength;Decreased range of motion;Decreased activity  tolerance;Pain;Decreased knowledge of use of DME;Decreased mobility       PT Treatment Interventions DME instruction;Therapeutic exercise;Gait training;Functional mobility training;Therapeutic activities;Patient/family education    PT Goals (Current goals can be found in the Care Plan section)  Acute Rehab PT Goals Patient Stated Goal: home soon PT Goal Formulation: With patient Time For Goal Achievement: 04/29/22 Potential to Achieve Goals: Good    Frequency 7X/week     Co-evaluation               AM-PAC PT "6 Clicks" Mobility  Outcome Measure Help needed turning from your back to your side while in a flat bed without using bedrails?: A Little Help needed moving from lying on your back to sitting on the side of a flat bed without using bedrails?: A Little Help needed moving to and from a bed to a chair (including a wheelchair)?: A Little Help needed standing up from a chair using your arms (e.g., wheelchair or bedside chair)?: A Little Help needed to walk in hospital room?: A Little Help needed climbing 3-5 steps with a railing? : A Little 6 Click Score: 18    End of Session Equipment Utilized During Treatment: Gait belt Activity Tolerance: Patient tolerated treatment well Patient left: with call bell/phone within reach;in chair;with chair alarm set   PT Visit Diagnosis: Other abnormalities of gait and mobility (R26.89);Difficulty in walking, not elsewhere classified (R26.2)    Time: 1521-1550 PT Time Calculation (min) (ACUTE ONLY): 29 min   Charges:   PT Evaluation $PT Eval Low Complexity: 1 Low PT Treatments $Gait Training: 8-22 mins        Baxter Flattery, PT  Acute Rehab Dept [facility]) [phone]  [facility] Weekend Pager [facility] only)  [phone]  04/22/2022   [facility] 04/22/2022, 4:38 PM

## 2022-04-22 NOTE — Anesthesia Procedure Notes (Signed)
Spinal  Patient location during procedure: OR Start time: 04/22/2022 7:33 AM End time: 04/22/2022 7:43 AM Reason for block: surgical anesthesia Staffing Performed: anesthesiologist  Anesthesiologist: Oleta Mouse, MD Performed by: Oleta Mouse, MD Authorized by: Oleta Mouse, MD   Preanesthetic Checklist Completed: patient identified, IV checked, risks and benefits discussed, surgical consent, monitors and equipment checked, pre-op evaluation and timeout performed Spinal Block Patient position: sitting Prep: DuraPrep Patient monitoring: heart rate, cardiac monitor, continuous pulse ox and blood pressure Approach: midline Location: L4-5 Injection technique: single-shot Needle Needle type: Pencan  Needle gauge: 24 G Needle length: 9 cm Assessment Sensory level: T6 Events: CSF return and second provider

## 2022-04-22 NOTE — Anesthesia Procedure Notes (Signed)
Anesthesia Regional Block: Adductor canal block   Pre-Anesthetic Checklist: , timeout performed,  Correct Patient, Correct Site, Correct Laterality,  Correct Procedure, Correct Position, site marked,  Risks and benefits discussed,  Surgical consent,  Pre-op evaluation,  At surgeon's request and post-op pain management  Laterality: Left and Lower  Prep: chloraprep       Needles:  Injection technique: Single-shot      Needle Length: 9cm  Needle Gauge: 22     Additional Needles: Arrow StimuQuik ECHO Echogenic Stimulating PNB Needle  Procedures:,,,, ultrasound used (permanent image in chart),,    Narrative:  Start time: 04/22/2022 7:14 AM End time: 04/22/2022 7:18 AM Injection made incrementally with aspirations every 5 mL.  Performed by: Personally  Anesthesiologist: Oleta Mouse, MD

## 2022-04-23 ENCOUNTER — Encounter (HOSPITAL_COMMUNITY): Payer: Self-pay | Admitting: Orthopedic Surgery

## 2022-04-23 DIAGNOSIS — M1712 Unilateral primary osteoarthritis, left knee: Secondary | ICD-10-CM | POA: Diagnosis not present

## 2022-04-23 LAB — BASIC METABOLIC PANEL
Anion gap: 3 — ABNORMAL LOW (ref 5–15)
BUN: 16 mg/dL (ref 8–23)
CO2: 29 mmol/L (ref 22–32)
Calcium: 8.6 mg/dL — ABNORMAL LOW (ref 8.9–10.3)
Chloride: 109 mmol/L (ref 98–111)
Creatinine, Ser: 0.96 mg/dL (ref 0.44–1.00)
GFR, Estimated: >60 mL/min (ref >60)
Glucose, Bld: 105 mg/dL — ABNORMAL HIGH (ref 70–99)
Potassium: 3.4 mmol/L — ABNORMAL LOW (ref 3.5–5.1)
Sodium: 141 mmol/L (ref 135–145)

## 2022-04-23 LAB — CBC
HCT: 25.9 % — ABNORMAL LOW (ref 36.0–46.0)
Hemoglobin: 8.3 g/dL — ABNORMAL LOW (ref 12.0–15.0)
MCH: 30.3 pg (ref 26.0–34.0)
MCHC: 32 g/dL (ref 30.0–36.0)
MCV: 94.5 fL (ref 80.0–100.0)
Platelets: 142 10*3/uL — ABNORMAL LOW (ref 150–400)
RBC: 2.74 MIL/uL — ABNORMAL LOW (ref 3.87–5.11)
RDW: 14.6 % (ref 11.5–15.5)
WBC: 4.4 10*3/uL (ref 4.0–10.5)
nRBC: 0 % (ref 0.0–0.2)

## 2022-04-23 MED ORDER — HYDRALAZINE HCL 50 MG PO TABS: 50.0000 mg | ORAL_TABLET | Freq: Four times a day (QID) | ORAL | Status: DC | PRN

## 2022-04-23 MED ORDER — ONDANSETRON HCL 4 MG PO TABS: 4.0000 mg | ORAL_TABLET | Freq: Three times a day (TID) | ORAL | 0 refills | Status: AC | PRN

## 2022-04-23 MED ORDER — SENNA 8.6 MG PO TABS: 2.0000 | ORAL_TABLET | Freq: Every day | ORAL | 0 refills | Status: AC

## 2022-04-23 MED ORDER — POTASSIUM CHLORIDE CRYS ER 20 MEQ PO TBCR
40.0000 meq | EXTENDED_RELEASE_TABLET | Freq: Once | ORAL | Status: AC
  Administered 2022-04-23: 40 meq via ORAL
  Filled 2022-04-23: qty 2

## 2022-04-23 MED ORDER — DOCUSATE SODIUM 100 MG PO CAPS: 100.0000 mg | ORAL_CAPSULE | Freq: Two times a day (BID) | ORAL | 0 refills | Status: AC

## 2022-04-23 MED ORDER — APIXABAN 2.5 MG PO TABS: 2.5000 mg | ORAL_TABLET | Freq: Two times a day (BID) | ORAL | 0 refills | Status: DC

## 2022-04-23 MED ORDER — OXYCODONE HCL 5 MG PO TABS: 5.0000 mg | ORAL_TABLET | ORAL | 0 refills | Status: AC | PRN

## 2022-04-23 MED ORDER — POLYETHYLENE GLYCOL 3350 17 G PO PACK: 17.0000 g | PACK | Freq: Every day | ORAL | 0 refills | Status: AC | PRN

## 2022-04-23 MED ORDER — MELOXICAM 15 MG PO TABS: 15.0000 mg | ORAL_TABLET | Freq: Every day | ORAL | 2 refills | Status: AC

## 2022-04-23 NOTE — TOC Transition Note (Signed)
Transition of Care [facility]) - CM/SW Discharge Note  Patient Details  Name: Heather Ellis MRN: [id number] Date of Birth: 1945/09/01  Transition of [facility]) CM/SW Contact:  Sherie Don, LCSW Phone Number: 04/23/2022, 10:35 AM  Clinical Narrative: Patient is expected to discharge home after working with PT. CSW met with patient and daughter to confirm discharge plan. Patient will go home with OPPT at [facility]. Patient has a rolling walker at home, so there are no DME needs at this time. TOC signing off.    Final next level of care: OP Rehab Barriers to Discharge: No Barriers Identified  Patient Goals and CMS Choice Patient states their goals for this hospitalization and ongoing recovery are:: Discharge home with OPPT at [facility] Choice offered to / list presented to : NA  Discharge Plan and Services         DME Arranged: N/A DME Agency: NA  Readmission Risk Interventions     No data to display

## 2022-04-23 NOTE — Progress Notes (Signed)
    Subjective:  Patient reports pain as mild to none. Denies N/V/CP/SOB/Abd/Dizziness this morning. She reports that she is not really having much pain this morning just soreness. She denies tingling and numbness in her LE bilaterally.   Objective:   VITALS:   Vitals:   04/22/22 1248 04/22/22 1746 04/22/22 2320 04/23/22 0258  BP:  (!) 156/84 (!) 158/79 (!) 142/74  Pulse:  61 64 62  Resp:  18 16 16   Temp:  98.6 F (37 C) 98.2 F (36.8 C) 98 F (36.7 C)  TempSrc:   Oral Oral  SpO2:  100% 98% 97%  Weight: 67.6 kg     Height: 5\' 3"  (1.6 m)       Patient ambulated to the bathroom this morning. She is doing really well. NAD. Her daughter is at bedside.  Neurologically intact ABD soft Neurovascular intact Sensation intact distally Intact pulses distally Dorsiflexion/Plantar flexion intact No cellulitis present Compartment soft Dressing has scant dried blood on bandage this morning. Otherwise intact.  She has ice on her left knee this morning.   Lab Results  Component Value Date   WBC 4.4 04/23/2022   HGB 8.3 (L) 04/23/2022   HCT 25.9 (L) 04/23/2022   MCV 94.5 04/23/2022   PLT 142 (L) 04/23/2022   BMET    Component Value Date/Time   NA 141 04/23/2022 0341   NA 146 (H) 10/26/2018 0816   K 3.4 (L) 04/23/2022 0341   CL 109 04/23/2022 0341   CO2 29 04/23/2022 0341   GLUCOSE 105 (H) 04/23/2022 0341   BUN 16 04/23/2022 0341   BUN 17 10/26/2018 0816   CREATININE 0.96 04/23/2022 0341   CREATININE 0.65 03/26/2022 1127   CALCIUM 8.6 (L) 04/23/2022 0341   GFRNONAA >60 04/23/2022 0341   GFRNONAA >60 03/26/2022 1127     Assessment/Plan: 1 Day Post-Op   Principal Problem:   Degenerative arthritis of left knee Active Problems:   Osteoarthritis of left knee  Potassium low at 3.4 this morning. 40 mEq ordered this morning.    WBAT with walker DVT ppx:  Eliquis 2.5 mg , SCDs, TEDS PO pain control PT/OT: Patient ambulated 65 feet with PT yesterday. PT to come by  again today.  Dispo: Patient is doing very well this morning. D/c home once cleared with PT.   Charlott Rakes, PA-C 04/23/2022, 6:57 AM   [facility]  [place] [address], [place], [place] [address]

## 2022-04-23 NOTE — Anesthesia Postprocedure Evaluation (Signed)
Anesthesia Post Note  Patient: Heather Ellis  Procedure(s) Performed: COMPUTER ASSISTED TOTAL KNEE ARTHROPLASTY (Left: Knee)     Patient location during evaluation: PACU Anesthesia Type: Regional, MAC and Spinal Level of consciousness: awake and alert Pain management: pain level controlled Vital Signs Assessment: post-procedure vital signs reviewed and stable Respiratory status: spontaneous breathing, nonlabored ventilation, respiratory function stable and patient connected to nasal cannula oxygen Cardiovascular status: stable and blood pressure returned to baseline Postop Assessment: no apparent nausea or vomiting Anesthetic complications: no   No notable events documented.  Last Vitals:  Vitals:   04/23/22 1018 04/23/22 1450  BP: 128/70 134/66  Pulse: 72 77  Resp: 17 17  Temp: 36.6 C 36.8 C  SpO2: 93% 93%    Last Pain:  Vitals:   04/23/22 1607  TempSrc:   PainSc: 2                  Nino Amano

## 2022-04-23 NOTE — Plan of Care (Signed)
  Problem: Pain Management: Goal: Pain level will decrease with appropriate interventions Outcome: Progressing   Problem: Coping: Goal: Level of anxiety will decrease Outcome: Progressing   Problem: Pain Managment: Goal: General experience of comfort will improve Outcome: Progressing   Problem: Safety: Goal: Ability to remain free from injury will improve Outcome: Progressing

## 2022-04-23 NOTE — Progress Notes (Signed)
Physical Therapy Treatment Patient Details Name: Heather Ellis MRN: [id number] DOB: 1946/08/17 Today's Date: 04/23/2022   History of Present Illness 76 yo female s/p L TKA on 04/22/22. PMH: RA, L THA, L rTSA, covid, COPD    PT Comments    POD # 1 pm session Daughter present during session.  Had daughter assist pt OOB to bathroom, assist in bathroom then assist back to bed.  Pt reported pain level 8/10 so session was limited.  Instructed daughter on HEP, use of ICE, proper Positioning L LE with elevation, discussed activity level at home.  Pt has a HIGH bed.  Demonstrated proper backward approach on step.  Discussed proper tech for car transfer. Daughter plans to stay with pt till Sunday than a "Cousin" will assist. Pt HAS met gaols to safely D/C to home today.     Recommendations for follow up therapy are one component of a multi-disciplinary discharge planning process, led by the attending physician.  Recommendations may be updated based on patient status, additional functional criteria and insurance authorization.  Follow Up Recommendations  Follow physician's recommendations for discharge plan and follow up therapies     Assistance Recommended at Discharge Intermittent Supervision/Assistance  Patient can return home with the following A little help with walking and/or transfers;A little help with bathing/dressing/bathroom;Help with stairs or ramp for entrance;Assist for transportation   Equipment Recommendations  None recommended by PT    Recommendations for Other Services       Precautions / Restrictions Precautions Precautions: Knee;Fall Precaution Comments: instructed no pillow under knee Restrictions Weight Bearing Restrictions: No LLE Weight Bearing: Weight bearing as tolerated     Mobility  Bed Mobility Overal bed mobility: Needs Assistance Bed Mobility: Supine to Sit, Sit to Supine     Supine to sit: Min guard Sit to supine: Min guard, Min assist    General bed mobility comments: demonstarted and instructed how to use a belt to self assist LE off bed and back onto bed.    Transfers Overall transfer level: Needs assistance Equipment used: Rolling walker (2 wheels) Transfers: Sit to/from Stand Sit to Stand: Supervision, Min guard           General transfer comment: demonstarted and Instructed Daughter how to assist with transfer from bed as well as in bathroom/toilet transfer.    Ambulation/Gait Ambulation/Gait assistance: Supervision, Min guard Gait Distance (Feet): 24 Feet (12 x 2) Assistive device: Rolling walker (2 wheels) Gait Pattern/deviations: Step-to pattern, Step-through pattern Gait velocity: decreased     General Gait Details: had Daughter "hands on" assist with amb in hallway a functional distance 24 feet (12 feet x 2 to and from bathroom) .  Pt c/o increased fatigue at end of session. Assisted back to bed.   Stairs             Wheelchair Mobility    Modified Rankin (Stroke Patients Only)       Balance                                            Cognition Arousal/Alertness: Awake/alert Behavior During Therapy: WFL for tasks assessed/performed Overall Cognitive Status: Within Functional Limits for tasks assessed                                 General Comments:  AxO x 3 pleasant but can fatigue easily.  Hx COPD/COVID        Exercises      General Comments        Pertinent Vitals/Pain Pain Assessment Pain Assessment: 0-10 Pain Score: 8  Pain Location: L knee Pain Descriptors / Indicators: Tender, Tightness, Operative site guarding Pain Intervention(s): Monitored during session, Patient requesting pain meds-RN notified, Repositioned, Ice applied    Home Living                          Prior Function            PT Goals (current goals can now be found in the care plan section) Progress towards PT goals: Progressing toward goals     Frequency    7X/week      PT Plan Current plan remains appropriate    Co-evaluation              AM-PAC PT "6 Clicks" Mobility   Outcome Measure  Help needed turning from your back to your side while in a flat bed without using bedrails?: A Little Help needed moving from lying on your back to sitting on the side of a flat bed without using bedrails?: A Little Help needed moving to and from a bed to a chair (including a wheelchair)?: A Little Help needed standing up from a chair using your arms (e.g., wheelchair or bedside chair)?: A Little Help needed to walk in hospital room?: A Little Help needed climbing 3-5 steps with a railing? : A Little 6 Click Score: 18    End of Session Equipment Utilized During Treatment: Gait belt Activity Tolerance: Patient tolerated treatment well Patient left: with call bell/phone within reach;in chair;with chair alarm set Nurse Communication: Mobility status PT Visit Diagnosis: Other abnormalities of gait and mobility (R26.89);Difficulty in walking, not elsewhere classified (R26.2)     Time: 3875-6433 PT Time Calculation (min) (ACUTE ONLY): 27 min  Charges:  $Gait Training: 8-22 mins $Self Care/Home Management: [place]  PTA [place] Office M-F          [phone] Weekend pager [phone]

## 2022-04-23 NOTE — Progress Notes (Signed)
Patient discharged to home w/ family. Given all belongings, instructions, equipment. Verbalized understanding of instructions.

## 2022-04-23 NOTE — Progress Notes (Addendum)
Physical Therapy Treatment Patient Details Name: Heather Ellis MRN: [id number] DOB: April 30, 1946 Today's Date: 04/23/2022   History of Present Illness 76 yo female s/p L TKA on 04/22/22. PMH: RA, L THA, L rTSA, covid, COPD    PT Comments    POD # 1 am session General Comments: AxO x 3 pleasant but can fatigue easily.  Hx COPD/COVID. Daughter present during session.  Assisted OOB to amb in hallway then to bathroom then back to bed.   Pt will need another PT session prior to D/C to complete HEP Education and increase mobility tolerance.   Recommendations for follow up therapy are one component of a multi-disciplinary discharge planning process, led by the attending physician.  Recommendations may be updated based on patient status, additional functional criteria and insurance authorization.  Follow Up Recommendations  Follow physician's recommendations for discharge plan and follow up therapies     Assistance Recommended at Discharge Intermittent Supervision/Assistance  Patient can return home with the following A little help with walking and/or transfers;A little help with bathing/dressing/bathroom;Help with stairs or ramp for entrance;Assist for transportation   Equipment Recommendations  None recommended by PT    Recommendations for Other Services       Precautions / Restrictions Precautions Precautions: Knee;Fall Precaution Comments: instructed no pillow under knee Restrictions Weight Bearing Restrictions: No LLE Weight Bearing: Weight bearing as tolerated     Mobility  Bed Mobility Overal bed mobility: Needs Assistance Bed Mobility: Supine to Sit, Sit to Supine     Supine to sit: Min guard Sit to supine: Min guard, Min assist   General bed mobility comments: demonstarted and instructed how to use a belt to self assist LE off bed and back onto bed.    Transfers Overall transfer level: Needs assistance Equipment used: Rolling walker (2 wheels) Transfers: Sit  to/from Stand Sit to Stand: Supervision, Min guard           General transfer comment: demonstarted and Instructed Daughter how to assist with transfer from bed as well as in bathroom/toilet transfer.    Ambulation/Gait Ambulation/Gait assistance: Supervision, Min guard Gait Distance (Feet): 35 Feet Assistive device: Rolling walker (2 wheels) Gait Pattern/deviations: Step-to pattern, Step-through pattern Gait velocity: decreased     General Gait Details: had Daughter "hands on" assist with amb in hallway a functional distance 35 feet.  Pt c/o increased fatigue at end of session.   Stairs    Up/down ONE step/curb with Daughter "hands on" using safety belt at 50% VC's on proper walker placement as well as proper sequencing. Performed twice.         Wheelchair Mobility    Modified Rankin (Stroke Patients Only)       Balance                                            Cognition Arousal/Alertness: Awake/alert Behavior During Therapy: WFL for tasks assessed/performed Overall Cognitive Status: Within Functional Limits for tasks assessed                                 General Comments: AxO x 3 pleasant but can fatigue easily.  Hx COPD/COVID        Exercises      General Comments        Pertinent Vitals/Pain Pain Assessment  Pain Assessment: Faces Pain Score: 5  Pain Location: L knee Pain Descriptors / Indicators: Tender, Tightness, Operative site guarding Pain Intervention(s): Monitored during session, Premedicated before session, Repositioned, Ice applied    Home Living                          Prior Function            PT Goals (current goals can now be found in the care plan section) Progress towards PT goals: Progressing toward goals    Frequency    7X/week      PT Plan Current plan remains appropriate    Co-evaluation              AM-PAC PT "6 Clicks" Mobility   Outcome Measure  Help  needed turning from your back to your side while in a flat bed without using bedrails?: A Little Help needed moving from lying on your back to sitting on the side of a flat bed without using bedrails?: A Little Help needed moving to and from a bed to a chair (including a wheelchair)?: A Little Help needed standing up from a chair using your arms (e.g., wheelchair or bedside chair)?: A Little Help needed to walk in hospital room?: A Little Help needed climbing 3-5 steps with a railing? : A Little 6 Click Score: 18    End of Session Equipment Utilized During Treatment: Gait belt Activity Tolerance: Patient tolerated treatment well Patient left: with call bell/phone within reach;in chair;with chair alarm set Nurse Communication: Mobility status PT Visit Diagnosis: Other abnormalities of gait and mobility (R26.89);Difficulty in walking, not elsewhere classified (R26.2)     Time: 1021-1050 PT Time Calculation (min) (ACUTE ONLY): 29 min  Charges:  $Gait Training: 8-22 mins $Therapeutic Activity: 8-22 mins                     Rica Koyanagi  PTA Acute  Rehabilitation Services Office M-F          [phone] Weekend pager [phone]

## 2022-04-24 NOTE — Discharge Summary (Signed)
Physician Discharge Summary  Patient ID: Heather Ellis MRN: [id number] DOB/AGE: Jan 28, 1946 76 y.o.  Admit date: 04/22/2022 Discharge date: 04/23/2022  Admission Diagnoses:  Degenerative arthritis of left knee  Discharge Diagnoses:  Principal Problem:   Degenerative arthritis of left knee Active Problems:   Osteoarthritis of left knee   Past Medical History:  Diagnosis Date   Acid reflux    Anemia    Asthma    Cancer ([place])    Right lung   COPD (chronic obstructive pulmonary disease) (HCC)    Dyspnea    WITH EXERTION   H/O polymyalgia rheumatica    History of COVID-19 2020   Hypertension    OA (osteoarthritis)    Obesity    Osteoporosis    Pre-diabetes    Syncope    Syncope and collapse 08/2018   Vitamin D deficiency     Surgeries: Procedure(s): COMPUTER ASSISTED TOTAL KNEE ARTHROPLASTY on 04/22/2022   Consultants (if any):   Discharged Condition: Improved  Hospital Course: Heather Ellis is an 76 y.o. female who was admitted 04/22/2022 with a diagnosis of Degenerative arthritis of left knee and went to the operating room on 04/22/2022 and underwent the above named procedures.    She was given perioperative antibiotics:  Anti-infectives (From admission, onward)    Start     Dose/Rate Route Frequency Ordered Stop   04/22/22 1400  ceFAZolin (ANCEF) IVPB 2g/100 mL premix        2 g 200 mL/hr over 30 Minutes Intravenous Every 6 hours 04/22/22 1228 04/23/22 0001   04/22/22 0600  vancomycin (VANCOCIN) IVPB 1000 mg/200 mL premix        1,000 mg 200 mL/hr over 60 Minutes Intravenous  Once 04/22/22 0523 04/22/22 0809   04/22/22 0600  ceFAZolin (ANCEF) IVPB 2g/100 mL premix        2 g 200 mL/hr over 30 Minutes Intravenous On call to O.R. 04/22/22 1517 04/22/22 0750       She was given sequential compression devices, early ambulation, and Eliquis 2.5mg  for DVT prophylaxis.   She benefited maximally from the hospital stay and there were no complications.     Recent vital signs:  Vitals:   04/23/22 1018 04/23/22 1450  BP: 128/70 134/66  Pulse: 72 77  Resp: 17 17  Temp: 97.8 F (36.6 C) 98.2 F (36.8 C)  SpO2: 93% 93%    Recent laboratory studies:  Lab Results  Component Value Date   HGB 8.3 (L) 04/23/2022   HGB 10.7 (L) 03/26/2022   HGB 11.3 (L) 10/10/2021   Lab Results  Component Value Date   WBC 4.4 04/23/2022   PLT 142 (L) 04/23/2022   Lab Results  Component Value Date   INR 1.1 (H) 04/04/2021   Lab Results  Component Value Date   NA 141 04/23/2022   K 3.4 (L) 04/23/2022   CL 109 04/23/2022   CO2 29 04/23/2022   BUN 16 04/23/2022   CREATININE 0.96 04/23/2022   GLUCOSE 105 (H) 04/23/2022     Allergies as of 04/23/2022       Reactions   Fosamax [alendronate Sodium] Nausea And Vomiting        Medication List     STOP taking these medications    aspirin EC 81 MG tablet   methotrexate 2.5 MG tablet   traMADol 50 MG tablet Commonly known as: ULTRAM       TAKE these medications    albuterol 108 (90 Base) MCG/ACT inhaler  Commonly known as: VENTOLIN HFA Inhale 2 puffs into the lungs every 6 (six) hours as needed for wheezing or shortness of breath.   Anoro Ellipta 62.5-25 MCG/ACT Aepb Generic drug: umeclidinium-vilanterol Inhale 1 puff into the lungs daily.   apixaban 2.5 MG Tabs tablet Commonly known as: Eliquis Take 1 tablet (2.5 mg total) by mouth 2 (two) times daily.   BIOFREEZE EX Apply 1 application topically daily as needed (soreness).   bisoprolol-hydrochlorothiazide 5-6.25 MG tablet Commonly known as: ZIAC Take 2 tablets by mouth in the morning.   cholecalciferol 1000 units tablet Commonly known as: VITAMIN D Take 1,000 Units by mouth daily in the afternoon.   docusate sodium 100 MG capsule Commonly known as: Colace Take 1 capsule (100 mg total) by mouth 2 (two) times daily.   folic acid 1 MG tablet Commonly known as: FOLVITE Take 1 mg by mouth daily.   lisinopril 40 MG  tablet Commonly known as: ZESTRIL Take 40 mg by mouth daily.   meloxicam 15 MG tablet Commonly known as: MOBIC Take 1 tablet (15 mg total) by mouth daily.   omeprazole 20 MG capsule Commonly known as: PRILOSEC Take 20 mg by mouth daily as needed (acid reflux/heartburn).   ondansetron 4 MG tablet Commonly known as: Zofran Take 1 tablet (4 mg total) by mouth every 8 (eight) hours as needed for nausea or vomiting. What changed: when to take this   oxyCODONE 5 MG immediate release tablet Commonly known as: Roxicodone Take 1 tablet (5 mg total) by mouth every 4 (four) hours as needed for up to 7 days for severe pain or moderate pain.   polyethylene glycol 17 g packet Commonly known as: MiraLax Take 17 g by mouth daily as needed for mild constipation or moderate constipation.   senna 8.6 MG Tabs tablet Commonly known as: SENOKOT Take 2 tablets (17.2 mg total) by mouth at bedtime for 15 days.               Discharge Care Instructions  (From admission, onward)           Start     Ordered   04/23/22 0000  Weight bearing as tolerated        04/23/22 0706   04/23/22 0000  Change dressing       Comments: Do not remove your dressing.   04/23/22 0706              WEIGHT BEARING   Weight bearing as tolerated with assist device (walker, cane, etc) as directed, use it as long as suggested by your surgeon or therapist, typically at least 4-6 weeks.   EXERCISES  Results after joint replacement surgery are often greatly improved when you follow the exercise, range of motion and muscle strengthening exercises prescribed by your doctor. Safety measures are also important to protect the joint from further injury. Any time any of these exercises cause you to have increased pain or swelling, decrease what you are doing until you are comfortable again and then slowly increase them. If you have problems or questions, call your caregiver or physical therapist for advice.    Rehabilitation is important following a joint replacement. After just a few days of immobilization, the muscles of the leg can become weakened and shrink (atrophy).  These exercises are designed to build up the tone and strength of the thigh and leg muscles and to improve motion. Often times heat used for twenty to thirty minutes before working out will loosen  up your tissues and help with improving the range of motion but do not use heat for the first two weeks following surgery (sometimes heat can increase post-operative swelling).   These exercises can be done on a training (exercise) mat, on the floor, on a table or on a bed. Use whatever works the best and is most comfortable for you.    Use music or television while you are exercising so that the exercises are a pleasant break in your day. This will make your life better with the exercises acting as a break in your routine that you can look forward to.   Perform all exercises about fifteen times, three times per day or as directed.  You should exercise both the operative leg and the other leg as well.  Exercises include:   Quad Sets - Tighten up the muscle on the front of the thigh (Quad) and hold for 5-10 seconds.   Straight Leg Raises - With your knee straight (if you were given a brace, keep it on), lift the leg to 60 degrees, hold for 3 seconds, and slowly lower the leg.  Perform this exercise against resistance later as your leg gets stronger.  Leg Slides: Lying on your back, slowly slide your foot toward your buttocks, bending your knee up off the floor (only go as far as is comfortable). Then slowly slide your foot back down until your leg is flat on the floor again.  Angel Wings: Lying on your back spread your legs to the side as far apart as you can without causing discomfort.  Hamstring Strength:  Lying on your back, push your heel against the floor with your leg straight by tightening up the muscles of your buttocks.  Repeat, but this  time bend your knee to a comfortable angle, and push your heel against the floor.  You may put a pillow under the heel to make it more comfortable if necessary.   A rehabilitation program following joint replacement surgery can speed recovery and prevent re-injury in the future due to weakened muscles. Contact your doctor or a physical therapist for more information on knee rehabilitation.    CONSTIPATION  Constipation is defined medically as fewer than three stools per week and severe constipation as less than one stool per week.  Even if you have a regular bowel pattern at home, your normal regimen is likely to be disrupted due to multiple reasons following surgery.  Combination of anesthesia, postoperative narcotics, change in appetite and fluid intake all can affect your bowels.   YOU MUST use at least one of the following options; they are listed in order of increasing strength to get the job done.  They are all available over the counter, and you may need to use some, POSSIBLY even all of these options:    Drink plenty of fluids (prune juice may be helpful) and high fiber foods Colace 100 mg by mouth twice a day  Senokot for constipation as directed and as needed Dulcolax (bisacodyl), take with full glass of water  Miralax (polyethylene glycol) once or twice a day as needed.  If you have tried all these things and are unable to have a bowel movement in the first 3-4 days after surgery call either your surgeon or your primary doctor.    If you experience loose stools or diarrhea, hold the medications until you stool forms back up.  If your symptoms do not get better within 1 week or if they get worse,  check with your doctor.  If you experience "the worst abdominal pain ever" or develop nausea or vomiting, please contact the office immediately for further recommendations for treatment.   ITCHING:  If you experience itching with your medications, try taking only a single pain pill, or even  half a pain pill at a time.  You can also use Benadryl over the counter for itching or also to help with sleep.   TED HOSE STOCKINGS:  Use stockings on both legs until for at least 2 weeks or as directed by physician office. They may be removed at night for sleeping.  MEDICATIONS:  See your medication summary on the "After Visit Summary" that nursing will review with you.  You may have some home medications which will be placed on hold until you complete the course of blood thinner medication.  It is important for you to complete the blood thinner medication as prescribed.  PRECAUTIONS:  If you experience chest pain or shortness of breath - call 911 immediately for transfer to the hospital emergency department.   If you develop a fever greater that 101 F, purulent drainage from wound, increased redness or drainage from wound, foul odor from the wound/dressing, or calf pain - CONTACT YOUR SURGEON.                                                   FOLLOW-UP APPOINTMENTS:  If you do not already have a post-op appointment, please call the office for an appointment to be seen by your surgeon.  Guidelines for how soon to be seen are listed in your "After Visit Summary", but are typically between 1-4 weeks after surgery.  OTHER INSTRUCTIONS:   Knee Replacement:  Do not place pillow under knee, focus on keeping the knee straight while resting. CPM instructions: 0-90 degrees, 2 hours in the morning, 2 hours in the afternoon, and 2 hours in the evening. Place foam block, curve side up under heel at all times except when in CPM or when walking.  DO NOT modify, tear, cut, or change the foam block in any way.   MAKE SURE YOU:  Understand these instructions.  Get help right away if you are not doing well or get worse.    Thank you for letting us be a part of your medical care team.  It is a privilege we respect greatly.  We hope these instructions will help you stay on track for a fast and full recovery!    Diagnostic Studies: DG Knee Left Port  Result Date: 04/22/2022 CLINICAL DATA:  Status post left knee arthroplasty. EXAM: PORTABLE LEFT KNEE - 1-2 VIEW COMPARISON:  None Available. FINDINGS: The left femoral, tibial and patellar components are well situated. Expected postoperative changes are noted in the soft tissues anteriorly. IMPRESSION: Status post left total knee arthroplasty. Electronically Signed   By: Marijo Conception M.D.   On: 04/22/2022 10:25   MM DIAG BREAST TOMO UNI RIGHT  Result Date: 04/16/2022 CLINICAL DATA:  76 year old with a personal history of RIGHT lung cancer for which the patient had radiation therapy. She presents now with nonfocal pain involving the lower RIGHT breast. EXAM: DIGITAL DIAGNOSTIC UNILATERAL RIGHT MAMMOGRAM WITH TOMOSYNTHESIS TECHNIQUE: Right digital diagnostic mammography and breast tomosynthesis was performed. COMPARISON:  Previous exam(s). ACR Breast Density Category b: There are scattered areas of fibroglandular  density. FINDINGS: Full field CC and MLO views were obtained. No findings suspicious for malignancy. On correlative physical examination, the patient's pain is beneath the breast and there is tenderness to palpation of the cartilage of the anterior ribs in this location. IMPRESSION: No mammographic evidence of malignancy involving the RIGHT breast. RECOMMENDATION: Annual BILATERAL screening mammography which is due in February, 2024. I have discussed the findings and recommendations with the patient. If applicable, a reminder letter will be sent to the patient regarding the next appointment. BI-RADS CATEGORY  1: Negative. Electronically Signed   By: Evangeline Dakin M.D.   On: 04/16/2022 08:53  CT Chest W Contrast  Result Date: 03/26/2022 CLINICAL DATA:  Non-small cell lung cancer restaging * Tracking Code: BO * EXAM: CT CHEST WITH CONTRAST TECHNIQUE: Multidetector CT imaging of the chest was performed during intravenous contrast administration.  RADIATION DOSE REDUCTION: This exam was performed according to the departmental dose-optimization program which includes automated exposure control, adjustment of the mA and/or kV according to patient size and/or use of iterative reconstruction technique. CONTRAST:  2mL OMNIPAQUE IOHEXOL 300 MG/ML  SOLN COMPARISON:  Multiple exams, including 09/22/2021 FINDINGS: Cardiovascular: Ascending thoracic aortic aneurysm stable at 4.2 cm in diameter, extending up into the proximal arch. Aortic and branch vessel atherosclerotic calcification. Mild mitral valve calcification. Mediastinum/Nodes: No significant thyroid lesion. Small mediastinal lymph nodes are not pathologically enlarged. Small type 1 hiatal hernia. Lungs/Pleura: The regional right upper lobe nodule is obscured by increasing surrounding bandlike airspace opacity involving both the right upper lobe and potentially a small portion of the superior segment right lower lobe. There is volume loss in the left upper lobe particularly along the major and minor fissures. Fiducial noted along the medial margin of this process at the level of the fiducial, the thickness of the adjacent density measures 1.5 cm, previously 1.3 cm. Strictly speaking I cannot exclude enlargement of the underlying nodule although more likely the increasing density in this vicinity is related to radiation pneumonitis. Scattered faint nodularity in the upper lobes is unchanged from prior. 5 by 2 mm left lower lobe nodule on image 102 series 5 appears stable. Upper Abdomen: Unremarkable Musculoskeletal: Old right posterolateral rib deformities from healed fractures. Left glenoid hardware from shoulder replacement noted. Thoracic spondylosis. IMPRESSION: 1. Progressive bandlike density in the right upper lobe with associated volume loss. This density obscures the original nodule. The transverse thickness of tissues lateral to the fiducial marker is currently 1.5 cm in previously 1.3 cm. This  difference is probably due to volume loss and adjacent inflammatory findings from radiation pneumonitis, continued surveillance suggested. 2. Stable faint nodularity in the upper lobes, likely benign but meriting surveillance. Stable 5 by 2 mm left lower lobe nodule. Ascending thoracic aortic aneurysm 4.2 cm in diameter. This might be followed in the context of the patient's follow up oncology imaging. Otherwise, recommend annual imaging followup by CTA or MRA. This recommendation follows 2010 ACCF/AHA/AATS/ACR/ASA/SCA/SCAI/SIR/STS/SVM Guidelines for the Diagnosis and Management of Patients with Thoracic Aortic Disease. Circulation. 2010; 121: K938-H829. Aortic aneurysm NOS (ICD10-I71.9) 3. Aortic Atherosclerosis (ICD10-I70.0). Mitral valve calcification. 4. Small type 1 hiatal hernia Electronically Signed   By: Van Clines M.D.   On: 03/26/2022 15:54    Disposition: Discharge disposition: 01-Home or Self Care       Discharge Instructions     Call MD / Call 911   Complete by: As directed    If you experience chest pain or shortness of breath, CALL 911 and  be transported to the hospital emergency room.  If you develope a fever above 101 F, pus (white drainage) or increased drainage or redness at the wound, or calf pain, call your surgeon's office.   Change dressing   Complete by: As directed    Do not remove your dressing.   Constipation Prevention   Complete by: As directed    Drink plenty of fluids.  Prune juice may be helpful.  You may use a stool softener, such as Colace (over the counter) 100 mg twice a day.  Use MiraLax (over the counter) for constipation as needed.   Diet - low sodium heart healthy   Complete by: As directed    Discharge instructions   Complete by: As directed    Elevate toes above nose. Use cryotherapy as needed for pain and swelling.   Do not put a pillow under the knee. Place it under the heel.   Complete by: As directed    Driving restrictions   Complete  by: As directed    No driving for 6 weeks   Increase activity slowly as tolerated   Complete by: As directed    Lifting restrictions   Complete by: As directed    No lifting for 6 weeks   Post-operative opioid taper instructions:   Complete by: As directed    POST-OPERATIVE OPIOID TAPER INSTRUCTIONS: It is important to wean off of your opioid medication as soon as possible. If you do not need pain medication after your surgery it is ok to stop day one. Opioids include: Codeine, Hydrocodone(Norco, Vicodin), Oxycodone(Percocet, oxycontin) and hydromorphone amongst others.  Long term and even short term use of opiods can cause: Increased pain response Dependence Constipation Depression Respiratory depression And more.  Withdrawal symptoms can include Flu like symptoms Nausea, vomiting And more Techniques to manage these symptoms Hydrate well Eat regular healthy meals Stay active Use relaxation techniques(deep breathing, meditating, yoga) Do Not substitute Alcohol to help with tapering If you have been on opioids for less than two weeks and do not have pain than it is ok to stop all together.  Plan to wean off of opioids This plan should start within one week post op of your joint replacement. Maintain the same interval or time between taking each dose and first decrease the dose.  Cut the total daily intake of opioids by one tablet each day Next start to increase the time between doses. The last dose that should be eliminated is the evening dose.      TED hose   Complete by: As directed    Use stockings (TED hose) for 2 weeks on both leg(s).  You may remove them at night for sleeping.   Weight bearing as tolerated   Complete by: As directed         Follow-up Information     Swinteck, Aaron Edelman, MD Follow up in 2 week(s).   Specialty: Orthopedic Surgery Contact information: [address] [place] [address] [phone]                   Signed: Charlott Rakes, PA-C 04/24/2022, 8:09 AM

## 2022-04-27 DIAGNOSIS — M25562 Pain in left knee: Secondary | ICD-10-CM | POA: Diagnosis not present

## 2022-05-05 DIAGNOSIS — M25562 Pain in left knee: Secondary | ICD-10-CM | POA: Diagnosis not present

## 2022-05-07 DIAGNOSIS — M25562 Pain in left knee: Secondary | ICD-10-CM | POA: Diagnosis not present

## 2022-05-11 DIAGNOSIS — Z471 Aftercare following joint replacement surgery: Secondary | ICD-10-CM | POA: Diagnosis not present

## 2022-05-11 DIAGNOSIS — M25562 Pain in left knee: Secondary | ICD-10-CM | POA: Diagnosis not present

## 2022-05-11 DIAGNOSIS — Z96652 Presence of left artificial knee joint: Secondary | ICD-10-CM | POA: Diagnosis not present

## 2022-05-13 DIAGNOSIS — M25562 Pain in left knee: Secondary | ICD-10-CM | POA: Diagnosis not present

## 2022-05-15 DIAGNOSIS — M25562 Pain in left knee: Secondary | ICD-10-CM | POA: Diagnosis not present

## 2022-05-18 DIAGNOSIS — M25562 Pain in left knee: Secondary | ICD-10-CM | POA: Diagnosis not present

## 2022-05-20 DIAGNOSIS — M25562 Pain in left knee: Secondary | ICD-10-CM | POA: Diagnosis not present

## 2022-05-22 DIAGNOSIS — M25562 Pain in left knee: Secondary | ICD-10-CM | POA: Diagnosis not present

## 2022-05-30 DIAGNOSIS — I10 Essential (primary) hypertension: Secondary | ICD-10-CM | POA: Diagnosis not present

## 2022-05-30 DIAGNOSIS — R739 Hyperglycemia, unspecified: Secondary | ICD-10-CM | POA: Diagnosis not present

## 2022-05-30 DIAGNOSIS — J449 Chronic obstructive pulmonary disease, unspecified: Secondary | ICD-10-CM | POA: Diagnosis not present

## 2022-05-30 DIAGNOSIS — M1990 Unspecified osteoarthritis, unspecified site: Secondary | ICD-10-CM | POA: Diagnosis not present

## 2022-06-02 ENCOUNTER — Encounter (HOSPITAL_COMMUNITY): Payer: Self-pay | Admitting: Orthopedic Surgery

## 2022-06-02 DIAGNOSIS — Z471 Aftercare following joint replacement surgery: Secondary | ICD-10-CM | POA: Diagnosis not present

## 2022-06-02 DIAGNOSIS — Z96652 Presence of left artificial knee joint: Secondary | ICD-10-CM | POA: Diagnosis not present

## 2022-06-02 NOTE — Progress Notes (Signed)
For Short Stay: [place] appointment date:N/A Date of COVID positive in last 49 days:N/A  Bowel Prep reminder: N/A   For Anesthesia: PCP - Dr. Thedora Hinders Cardiologist - N/A Neurologic-Dr Jaynee Eagles  Chest x-ray - 03/16/22 in epic EKG - 02/26/22 in chart Stress Test - N/A ECHO - greater than 2 years 09/23/18 in epic Cardiac Cath - N/A Pacemaker/ICD device last checked:N/A Pacemaker orders received: N/A Device Rep notified: N/A  Spinal Cord Stimulator: N/A  Sleep Study - N/A CPAP - N/A  Fasting Blood Sugar - N/A Checks Blood Sugar __N/A___ times a day Date and result of last Hgb A1c- 04/14/22 5.3  Blood Thinner Instructions: Eliquis unsure which medication it is Aspirin Instructions: N/A Last Dose:N/A  Activity level:  activities of daily living without stopping and without chest pain and/or shortness of breath    Anesthesia review: Lung Cancer, COPD, HTN  Patient denies shortness of breath, fever, cough and chest pain at PAT appointment   Patient verbalized understanding of instructions that were given to them at the PAT appointment. Patient was also instructed that they will need to review over the PAT instructions again at home before surgery.

## 2022-06-03 ENCOUNTER — Ambulatory Visit: Payer: Self-pay | Admitting: Student

## 2022-06-03 ENCOUNTER — Encounter (HOSPITAL_COMMUNITY): Payer: Self-pay | Admitting: Orthopedic Surgery

## 2022-06-03 ENCOUNTER — Other Ambulatory Visit: Payer: Self-pay

## 2022-06-04 ENCOUNTER — Encounter (HOSPITAL_COMMUNITY)
Admission: RE | Disposition: A | Discharge status: Home / Self Care | Payer: Self-pay | Source: Ambulatory Visit | Attending: Orthopedic Surgery

## 2022-06-04 ENCOUNTER — Inpatient Hospital Stay (HOSPITAL_COMMUNITY): Payer: No Typology Code available for payment source | Admitting: Anesthesiology

## 2022-06-04 ENCOUNTER — Other Ambulatory Visit: Payer: Self-pay

## 2022-06-04 ENCOUNTER — Inpatient Hospital Stay (HOSPITAL_COMMUNITY)
Admission: RE | Admit: 2022-06-04 | Discharge: 2022-06-06 | DRG: 903 | Disposition: A | Discharge status: Home / Self Care | Payer: No Typology Code available for payment source | Source: Ambulatory Visit | Attending: Orthopedic Surgery | Admitting: Orthopedic Surgery

## 2022-06-04 ENCOUNTER — Encounter (HOSPITAL_COMMUNITY): Payer: Self-pay | Admitting: Orthopedic Surgery

## 2022-06-04 DIAGNOSIS — Z8616 Personal history of COVID-19: Secondary | ICD-10-CM

## 2022-06-04 DIAGNOSIS — M069 Rheumatoid arthritis, unspecified: Secondary | ICD-10-CM | POA: Diagnosis not present

## 2022-06-04 DIAGNOSIS — Z87891 Personal history of nicotine dependence: Secondary | ICD-10-CM | POA: Diagnosis not present

## 2022-06-04 DIAGNOSIS — Z7951 Long term (current) use of inhaled steroids: Secondary | ICD-10-CM

## 2022-06-04 DIAGNOSIS — R7303 Prediabetes: Secondary | ICD-10-CM | POA: Diagnosis present

## 2022-06-04 DIAGNOSIS — I10 Essential (primary) hypertension: Secondary | ICD-10-CM | POA: Diagnosis not present

## 2022-06-04 DIAGNOSIS — I96 Gangrene, not elsewhere classified: Secondary | ICD-10-CM | POA: Diagnosis present

## 2022-06-04 DIAGNOSIS — Z96612 Presence of left artificial shoulder joint: Secondary | ICD-10-CM | POA: Diagnosis present

## 2022-06-04 DIAGNOSIS — J449 Chronic obstructive pulmonary disease, unspecified: Secondary | ICD-10-CM | POA: Diagnosis not present

## 2022-06-04 DIAGNOSIS — Z8249 Family history of ischemic heart disease and other diseases of the circulatory system: Secondary | ICD-10-CM

## 2022-06-04 DIAGNOSIS — Z79899 Other long term (current) drug therapy: Secondary | ICD-10-CM

## 2022-06-04 DIAGNOSIS — K219 Gastro-esophageal reflux disease without esophagitis: Secondary | ICD-10-CM | POA: Diagnosis present

## 2022-06-04 DIAGNOSIS — Z833 Family history of diabetes mellitus: Secondary | ICD-10-CM | POA: Diagnosis not present

## 2022-06-04 DIAGNOSIS — M810 Age-related osteoporosis without current pathological fracture: Secondary | ICD-10-CM | POA: Diagnosis not present

## 2022-06-04 DIAGNOSIS — Y831 Surgical operation with implant of artificial internal device as the cause of abnormal reaction of the patient, or of later complication, without mention of misadventure at the time of the procedure: Secondary | ICD-10-CM | POA: Diagnosis present

## 2022-06-04 DIAGNOSIS — T8131XA Disruption of external operation (surgical) wound, not elsewhere classified, initial encounter: Principal | ICD-10-CM | POA: Diagnosis present

## 2022-06-04 DIAGNOSIS — Z85118 Personal history of other malignant neoplasm of bronchus and lung: Secondary | ICD-10-CM | POA: Diagnosis not present

## 2022-06-04 DIAGNOSIS — D649 Anemia, unspecified: Secondary | ICD-10-CM

## 2022-06-04 DIAGNOSIS — T8132XA Disruption of internal operation (surgical) wound, not elsewhere classified, initial encounter: Secondary | ICD-10-CM | POA: Diagnosis not present

## 2022-06-04 DIAGNOSIS — M353 Polymyalgia rheumatica: Secondary | ICD-10-CM | POA: Diagnosis not present

## 2022-06-04 DIAGNOSIS — Z7901 Long term (current) use of anticoagulants: Secondary | ICD-10-CM | POA: Diagnosis not present

## 2022-06-04 DIAGNOSIS — Z96642 Presence of left artificial hip joint: Secondary | ICD-10-CM | POA: Diagnosis not present

## 2022-06-04 DIAGNOSIS — J4489 Other specified chronic obstructive pulmonary disease: Secondary | ICD-10-CM | POA: Diagnosis present

## 2022-06-04 HISTORY — PX: INCISION AND DRAINAGE: SHX5863

## 2022-06-04 HISTORY — DX: Malignant neoplasm of unspecified part of unspecified bronchus or lung: C34.90

## 2022-06-04 LAB — BASIC METABOLIC PANEL
Anion gap: 6 (ref 5–15)
BUN: 17 mg/dL (ref 8–23)
CO2: 29 mmol/L (ref 22–32)
Calcium: 9.4 mg/dL (ref 8.9–10.3)
Chloride: 105 mmol/L (ref 98–111)
Creatinine, Ser: 0.72 mg/dL (ref 0.44–1.00)
GFR, Estimated: >60 mL/min (ref >60)
Glucose, Bld: 94 mg/dL (ref 70–99)
Potassium: 3.3 mmol/L — ABNORMAL LOW (ref 3.5–5.1)
Sodium: 140 mmol/L (ref 135–145)

## 2022-06-04 LAB — CBC
HCT: 32.5 % — ABNORMAL LOW (ref 36.0–46.0)
Hemoglobin: 10.1 g/dL — ABNORMAL LOW (ref 12.0–15.0)
MCH: 28.8 pg (ref 26.0–34.0)
MCHC: 31.1 g/dL (ref 30.0–36.0)
MCV: 92.6 fL (ref 80.0–100.0)
Platelets: 205 10*3/uL (ref 150–400)
RBC: 3.51 MIL/uL — ABNORMAL LOW (ref 3.87–5.11)
RDW: 16.3 % — ABNORMAL HIGH (ref 11.5–15.5)
WBC: 4 10*3/uL (ref 4.0–10.5)
nRBC: 0 % (ref 0.0–0.2)

## 2022-06-04 SURGERY — IRRIGATION AND DEBRIDEMENT KNEE WITH POLY EXCHANGE
Anesthesia: Choice

## 2022-06-04 SURGERY — INCISION AND DRAINAGE
Anesthesia: General | Laterality: Left

## 2022-06-04 MED ORDER — ONDANSETRON HCL 4 MG PO TABS: 4.0000 mg | ORAL_TABLET | Freq: Four times a day (QID) | ORAL | Status: DC | PRN

## 2022-06-04 MED ORDER — FENTANYL CITRATE (PF) 100 MCG/2ML IJ SOLN
INTRAMUSCULAR | Status: AC
  Filled 2022-06-04: qty 2

## 2022-06-04 MED ORDER — SODIUM CHLORIDE 0.9 % IR SOLN
Status: DC | PRN
  Administered 2022-06-04: 1000 mL
  Administered 2022-06-04: 3000 mL

## 2022-06-04 MED ORDER — VANCOMYCIN HCL IN DEXTROSE 1-5 GM/200ML-% IV SOLN
1000.0000 mg | INTRAVENOUS | Status: AC
  Administered 2022-06-04: 1000 mg via INTRAVENOUS
  Filled 2022-06-04: qty 200

## 2022-06-04 MED ORDER — CHLORHEXIDINE GLUCONATE 0.12 % MT SOLN: 15.0000 mL | Freq: Once | OROMUCOSAL | Status: DC

## 2022-06-04 MED ORDER — CEFAZOLIN SODIUM-DEXTROSE 2-4 GM/100ML-% IV SOLN
2.0000 g | INTRAVENOUS | Status: AC
  Administered 2022-06-04: 2 g via INTRAVENOUS
  Filled 2022-06-04: qty 100

## 2022-06-04 MED ORDER — PROPOFOL 10 MG/ML IV BOLUS
INTRAVENOUS | Status: DC | PRN
  Administered 2022-06-04: 110 mg via INTRAVENOUS
  Administered 2022-06-04: 20 mg via INTRAVENOUS

## 2022-06-04 MED ORDER — UMECLIDINIUM-VILANTEROL 62.5-25 MCG/ACT IN AEPB
1.0000 | INHALATION_SPRAY | Freq: Every day | RESPIRATORY_TRACT | Status: DC
  Administered 2022-06-05 – 2022-06-06 (×2): 1 via RESPIRATORY_TRACT
  Filled 2022-06-04: qty 14

## 2022-06-04 MED ORDER — LACTATED RINGERS IV SOLN: INTRAVENOUS | Status: DC

## 2022-06-04 MED ORDER — ALBUTEROL SULFATE HFA 108 (90 BASE) MCG/ACT IN AERS: 2.0000 | INHALATION_SPRAY | Freq: Four times a day (QID) | RESPIRATORY_TRACT | Status: DC | PRN

## 2022-06-04 MED ORDER — FENTANYL CITRATE (PF) 100 MCG/2ML IJ SOLN
INTRAMUSCULAR | Status: DC | PRN
  Administered 2022-06-04 (×4): 50 ug via INTRAVENOUS

## 2022-06-04 MED ORDER — VITAMIN D 25 MCG (1000 UNIT) PO TABS
1000.0000 [IU] | ORAL_TABLET | Freq: Every day | ORAL | Status: DC
  Administered 2022-06-05: 1000 [IU] via ORAL
  Filled 2022-06-04: qty 1

## 2022-06-04 MED ORDER — VANCOMYCIN HCL IN DEXTROSE 1-5 GM/200ML-% IV SOLN
1000.0000 mg | INTRAVENOUS | Status: DC
  Administered 2022-06-05 – 2022-06-06 (×2): 1000 mg via INTRAVENOUS
  Filled 2022-06-04 (×2): qty 200

## 2022-06-04 MED ORDER — FENTANYL CITRATE PF 50 MCG/ML IJ SOSY
PREFILLED_SYRINGE | INTRAMUSCULAR | Status: AC
  Filled 2022-06-04: qty 1

## 2022-06-04 MED ORDER — METHOCARBAMOL 500 MG PO TABS
500.0000 mg | ORAL_TABLET | Freq: Four times a day (QID) | ORAL | Status: DC | PRN
  Administered 2022-06-04: 500 mg via ORAL
  Filled 2022-06-04 (×2): qty 1

## 2022-06-04 MED ORDER — ZINC SULFATE 220 (50 ZN) MG PO CAPS
220.0000 mg | ORAL_CAPSULE | Freq: Every day | ORAL | Status: DC
  Administered 2022-06-04 – 2022-06-05 (×2): 220 mg via ORAL
  Filled 2022-06-04 (×2): qty 1

## 2022-06-04 MED ORDER — ACETAMINOPHEN 325 MG PO TABS: 325.0000 mg | ORAL_TABLET | Freq: Four times a day (QID) | ORAL | Status: DC | PRN

## 2022-06-04 MED ORDER — ATROPINE SULFATE 0.4 MG/ML IV SOLN
INTRAVENOUS | Status: AC
  Filled 2022-06-04: qty 1

## 2022-06-04 MED ORDER — FOLIC ACID 1 MG PO TABS
1.0000 mg | ORAL_TABLET | Freq: Every day | ORAL | Status: DC
  Administered 2022-06-05 – 2022-06-06 (×2): 1 mg via ORAL
  Filled 2022-06-04 (×2): qty 1

## 2022-06-04 MED ORDER — MORPHINE SULFATE (PF) 2 MG/ML IV SOLN
0.5000 mg | INTRAVENOUS | Status: DC | PRN
  Administered 2022-06-04: 1 mg via INTRAVENOUS
  Filled 2022-06-04: qty 1

## 2022-06-04 MED ORDER — METOCLOPRAMIDE HCL 5 MG PO TABS: 5.0000 mg | ORAL_TABLET | Freq: Three times a day (TID) | ORAL | Status: DC | PRN

## 2022-06-04 MED ORDER — SENNA 8.6 MG PO TABS
1.0000 | ORAL_TABLET | Freq: Two times a day (BID) | ORAL | Status: DC
  Administered 2022-06-04 – 2022-06-06 (×4): 8.6 mg via ORAL
  Filled 2022-06-04 (×4): qty 1

## 2022-06-04 MED ORDER — HYDRALAZINE HCL 20 MG/ML IJ SOLN: 10.0000 mg | Freq: Once | INTRAMUSCULAR | Status: AC

## 2022-06-04 MED ORDER — METHOCARBAMOL 500 MG IVPB - SIMPLE MED: 500.0000 mg | Freq: Four times a day (QID) | INTRAVENOUS | Status: DC | PRN

## 2022-06-04 MED ORDER — PANTOPRAZOLE SODIUM 40 MG PO TBEC
40.0000 mg | DELAYED_RELEASE_TABLET | Freq: Every day | ORAL | Status: DC
  Administered 2022-06-05 – 2022-06-06 (×2): 40 mg via ORAL
  Filled 2022-06-04 (×2): qty 1

## 2022-06-04 MED ORDER — OXYCODONE HCL 5 MG/5ML PO SOLN: 5.0000 mg | Freq: Once | ORAL | Status: DC | PRN

## 2022-06-04 MED ORDER — ORAL CARE MOUTH RINSE: 15.0000 mL | Freq: Once | OROMUCOSAL | Status: DC

## 2022-06-04 MED ORDER — POVIDONE-IODINE 10 % EX SWAB
2.0000 | Freq: Once | CUTANEOUS | Status: AC
  Administered 2022-06-04: 2 via TOPICAL

## 2022-06-04 MED ORDER — METOCLOPRAMIDE HCL 5 MG/ML IJ SOLN: 5.0000 mg | Freq: Three times a day (TID) | INTRAMUSCULAR | Status: DC | PRN

## 2022-06-04 MED ORDER — LABETALOL HCL 5 MG/ML IV SOLN
INTRAVENOUS | Status: DC | PRN
  Administered 2022-06-04: 2.5 mg via INTRAVENOUS
  Administered 2022-06-04: 5 mg via INTRAVENOUS
  Administered 2022-06-04 (×3): 2.5 mg via INTRAVENOUS

## 2022-06-04 MED ORDER — ALBUTEROL SULFATE (2.5 MG/3ML) 0.083% IN NEBU: 2.5000 mg | INHALATION_SOLUTION | Freq: Four times a day (QID) | RESPIRATORY_TRACT | Status: DC | PRN

## 2022-06-04 MED ORDER — HYDRALAZINE HCL 20 MG/ML IJ SOLN
INTRAMUSCULAR | Status: AC
  Administered 2022-06-04: 10 mg via INTRAVENOUS
  Filled 2022-06-04: qty 1

## 2022-06-04 MED ORDER — VITAMIN C 500 MG PO TABS
1000.0000 mg | ORAL_TABLET | Freq: Two times a day (BID) | ORAL | Status: DC
  Administered 2022-06-04 – 2022-06-06 (×4): 1000 mg via ORAL
  Filled 2022-06-04 (×5): qty 2

## 2022-06-04 MED ORDER — FENTANYL CITRATE PF 50 MCG/ML IJ SOSY
25.0000 ug | PREFILLED_SYRINGE | INTRAMUSCULAR | Status: DC | PRN
  Administered 2022-06-04: 50 ug via INTRAVENOUS

## 2022-06-04 MED ORDER — DOCUSATE SODIUM 100 MG PO CAPS
100.0000 mg | ORAL_CAPSULE | Freq: Two times a day (BID) | ORAL | Status: DC
  Administered 2022-06-04 – 2022-06-06 (×4): 100 mg via ORAL
  Filled 2022-06-04 (×4): qty 1

## 2022-06-04 MED ORDER — ACETAMINOPHEN 500 MG PO TABS
1000.0000 mg | ORAL_TABLET | Freq: Once | ORAL | Status: AC
  Administered 2022-06-04: 1000 mg via ORAL
  Filled 2022-06-04: qty 2

## 2022-06-04 MED ORDER — DEXAMETHASONE SODIUM PHOSPHATE 10 MG/ML IJ SOLN
INTRAMUSCULAR | Status: DC | PRN
  Administered 2022-06-04: 5 mg via INTRAVENOUS

## 2022-06-04 MED ORDER — TRANEXAMIC ACID-NACL 1000-0.7 MG/100ML-% IV SOLN
1000.0000 mg | INTRAVENOUS | Status: AC
  Administered 2022-06-04: 1000 mg via INTRAVENOUS
  Filled 2022-06-04: qty 100

## 2022-06-04 MED ORDER — HYDROCODONE-ACETAMINOPHEN 7.5-325 MG PO TABS
1.0000 | ORAL_TABLET | ORAL | Status: DC | PRN
  Administered 2022-06-04: 1 via ORAL
  Filled 2022-06-04: qty 1

## 2022-06-04 MED ORDER — LIDOCAINE 2% (20 MG/ML) 5 ML SYRINGE
INTRAMUSCULAR | Status: DC | PRN
  Administered 2022-06-04: 20 mg via INTRAVENOUS

## 2022-06-04 MED ORDER — ONDANSETRON HCL 4 MG/2ML IJ SOLN: 4.0000 mg | Freq: Once | INTRAMUSCULAR | Status: DC | PRN

## 2022-06-04 MED ORDER — ONDANSETRON HCL 4 MG/2ML IJ SOLN: 4.0000 mg | Freq: Four times a day (QID) | INTRAMUSCULAR | Status: DC | PRN

## 2022-06-04 MED ORDER — ALBUMIN HUMAN 5 % IV SOLN
INTRAVENOUS | Status: AC
  Filled 2022-06-04: qty 250

## 2022-06-04 MED ORDER — ONDANSETRON HCL 4 MG/2ML IJ SOLN
INTRAMUSCULAR | Status: DC | PRN
  Administered 2022-06-04: 4 mg via INTRAVENOUS

## 2022-06-04 MED ORDER — POLYETHYLENE GLYCOL 3350 17 G PO PACK: 17.0000 g | PACK | Freq: Every day | ORAL | Status: DC | PRN

## 2022-06-04 MED ORDER — OXYCODONE HCL 5 MG PO TABS: 5.0000 mg | ORAL_TABLET | Freq: Once | ORAL | Status: DC | PRN

## 2022-06-04 MED ORDER — FENTANYL CITRATE PF 50 MCG/ML IJ SOSY
PREFILLED_SYRINGE | INTRAMUSCULAR | Status: AC
  Administered 2022-06-04: 50 ug via INTRAVENOUS
  Filled 2022-06-04: qty 1

## 2022-06-04 MED ORDER — HYDROCODONE-ACETAMINOPHEN 5-325 MG PO TABS
1.0000 | ORAL_TABLET | ORAL | Status: DC | PRN
  Administered 2022-06-05 – 2022-06-06 (×3): 2 via ORAL
  Filled 2022-06-04 (×4): qty 2

## 2022-06-04 SURGICAL SUPPLY — 48 items
ADH SKN CLS APL DERMABOND .7 (GAUZE/BANDAGES/DRESSINGS) ×2
APL PRP STRL LF DISP 70% ISPRP (MISCELLANEOUS) ×2
BAG COUNTER SPONGE SURGICOUNT (BAG) IMPLANT
BAG SPNG CNTER NS LX DISP (BAG)
BNDG ELASTIC 4X5.8 VLCR STR LF (GAUZE/BANDAGES/DRESSINGS) ×2 IMPLANT
BNDG ELASTIC 6X5.8 VLCR STR LF (GAUZE/BANDAGES/DRESSINGS) ×2 IMPLANT
CHLORAPREP W/TINT 26 (MISCELLANEOUS) ×4 IMPLANT
COVER SURGICAL LIGHT HANDLE (MISCELLANEOUS) ×2 IMPLANT
CUFF TOURN SGL QUICK 34 (TOURNIQUET CUFF) ×1
CUFF TRNQT CYL 34X4.125X (TOURNIQUET CUFF) ×2 IMPLANT
DERMABOND ADVANCED .7 DNX12 (GAUZE/BANDAGES/DRESSINGS) ×4 IMPLANT
DRAPE INCISE IOBAN 66X45 STRL (DRAPES) ×6 IMPLANT
DRAPE SHEET LG 3/4 BI-LAMINATE (DRAPES) ×6 IMPLANT
DRAPE U-SHAPE 47X51 STRL (DRAPES) ×2 IMPLANT
DRSG AQUACEL AG ADV 3.5X10 (GAUZE/BANDAGES/DRESSINGS) ×2 IMPLANT
ELECT REM PT RETURN 15FT ADLT (MISCELLANEOUS) ×2 IMPLANT
GAUZE SPONGE 4X4 12PLY STRL (GAUZE/BANDAGES/DRESSINGS) ×2 IMPLANT
GLOVE BIO SURGEON STRL SZ8.5 (GLOVE) ×4 IMPLANT
GLOVE BIOGEL M 7.0 STRL (GLOVE) ×2 IMPLANT
GLOVE BIOGEL PI IND STRL 7.0 (GLOVE) ×2 IMPLANT
GLOVE BIOGEL PI IND STRL 8.5 (GLOVE) ×2 IMPLANT
GOWN STRL REUS W/TWL 2XL LVL3 (GOWN DISPOSABLE) ×2 IMPLANT
HANDPIECE INTERPULSE COAX TIP (DISPOSABLE) ×1
HOOD PEEL AWAY FLYTE STAYCOOL (MISCELLANEOUS) ×2 IMPLANT
JET LAVAGE IRRISEPT WOUND (IRRIGATION / IRRIGATOR)
KIT TURNOVER KIT A (KITS) IMPLANT
LAVAGE JET IRRISEPT WOUND (IRRIGATION / IRRIGATOR) IMPLANT
MARKER SKIN DUAL TIP RULER LAB (MISCELLANEOUS) ×2 IMPLANT
NDL SPNL 18GX3.5 QUINCKE PK (NEEDLE) ×1 IMPLANT
NEEDLE SPNL 18GX3.5 QUINCKE PK (NEEDLE) ×1 IMPLANT
NS IRRIG 1000ML POUR BTL (IV SOLUTION) ×2 IMPLANT
PACK TOTAL KNEE CUSTOM (KITS) ×2 IMPLANT
PROTECTOR NERVE ULNAR (MISCELLANEOUS) ×2 IMPLANT
SET HNDPC FAN SPRY TIP SCT (DISPOSABLE) ×2 IMPLANT
SPIKE FLUID TRANSFER (MISCELLANEOUS) ×2 IMPLANT
SUT MNCRL AB 3-0 PS2 18 (SUTURE) ×2 IMPLANT
SUT MON AB 2-0 CT1 36 (SUTURE) ×2 IMPLANT
SUT STRATAFIX PDO 1 14 VIOLET (SUTURE) ×1
SUT STRATFX PDO 1 14 VIOLET (SUTURE) ×1
SUT VIC AB 1 CT1 36 (SUTURE) ×2 IMPLANT
SUT VIC AB 2-0 CT1 27 (SUTURE) ×1
SUT VIC AB 2-0 CT1 TAPERPNT 27 (SUTURE) ×2 IMPLANT
SUTURE STRATFX PDO 1 14 VIOLET (SUTURE) ×2 IMPLANT
SWAB COLLECTION DEVICE MRSA (MISCELLANEOUS) ×2 IMPLANT
SWAB CULTURE ESWAB REG 1ML (MISCELLANEOUS) ×2 IMPLANT
SYR 50ML LL SCALE MARK (SYRINGE) ×2 IMPLANT
TRAY FOLEY MTR SLVR 16FR STAT (SET/KITS/TRAYS/PACK) ×2 IMPLANT
WRAP KNEE MAXI GEL POST OP (GAUZE/BANDAGES/DRESSINGS) ×2 IMPLANT

## 2022-06-04 SURGICAL SUPPLY — 54 items
ADH SKN CLS APL DERMABOND .7 (GAUZE/BANDAGES/DRESSINGS) ×1
APL PRP STRL LF DISP 70% ISPRP (MISCELLANEOUS) ×2
BAG COUNTER SPONGE SURGICOUNT (BAG) IMPLANT
BAG SPNG CNTER NS LX DISP (BAG)
BNDG ELASTIC 4X5.8 VLCR STR LF (GAUZE/BANDAGES/DRESSINGS) ×2 IMPLANT
BNDG ELASTIC 6X5.8 VLCR STR LF (GAUZE/BANDAGES/DRESSINGS) ×2 IMPLANT
CHLORAPREP W/TINT 26 (MISCELLANEOUS) ×4 IMPLANT
COVER SURGICAL LIGHT HANDLE (MISCELLANEOUS) ×2 IMPLANT
CUFF TOURN SGL QUICK 34 (TOURNIQUET CUFF)
CUFF TRNQT CYL 34X4.125X (TOURNIQUET CUFF) ×1 IMPLANT
DERMABOND ADVANCED .7 DNX12 (GAUZE/BANDAGES/DRESSINGS) ×3 IMPLANT
DRAPE INCISE IOBAN 66X45 STRL (DRAPES) ×6 IMPLANT
DRAPE SHEET LG 3/4 BI-LAMINATE (DRAPES) ×6 IMPLANT
DRAPE U-SHAPE 47X51 STRL (DRAPES) ×2 IMPLANT
DRESSING PEEL AND PLC PRVNA 13 (GAUZE/BANDAGES/DRESSINGS) IMPLANT
DRSG AQUACEL AG ADV 3.5X10 (GAUZE/BANDAGES/DRESSINGS) ×2 IMPLANT
DRSG PEEL AND PLACE PREVENA 13 (GAUZE/BANDAGES/DRESSINGS) ×1
ELECT REM PT RETURN 15FT ADLT (MISCELLANEOUS) ×2 IMPLANT
GAUZE SPONGE 4X4 12PLY STRL (GAUZE/BANDAGES/DRESSINGS) ×2 IMPLANT
GLOVE BIO SURGEON STRL SZ8.5 (GLOVE) ×4 IMPLANT
GLOVE BIOGEL M 7.0 STRL (GLOVE) ×2 IMPLANT
GLOVE BIOGEL PI IND STRL 7.5 (GLOVE) ×2 IMPLANT
GLOVE BIOGEL PI IND STRL 8 (GLOVE) ×2 IMPLANT
GLOVE BIOGEL PI IND STRL 8.5 (GLOVE) ×2 IMPLANT
GLOVE SURG LX STRL 7.5 STRW (GLOVE) ×4 IMPLANT
GOWN STRL REUS W/TWL 2XL LVL3 (GOWN DISPOSABLE) ×2 IMPLANT
HANDPIECE INTERPULSE COAX TIP (DISPOSABLE)
HOOD PEEL AWAY FLYTE STAYCOOL (MISCELLANEOUS) ×2 IMPLANT
JET LAVAGE IRRISEPT WOUND (IRRIGATION / IRRIGATOR)
KIT DRSG PREVENA PLUS 7DAY 125 (MISCELLANEOUS) ×1 IMPLANT
KIT TURNOVER KIT A (KITS) IMPLANT
LAVAGE JET IRRISEPT WOUND (IRRIGATION / IRRIGATOR) IMPLANT
MARKER SKIN DUAL TIP RULER LAB (MISCELLANEOUS) ×2 IMPLANT
NDL SPNL 18GX3.5 QUINCKE PK (NEEDLE) ×2 IMPLANT
NEEDLE SPNL 18GX3.5 QUINCKE PK (NEEDLE) ×1
NS IRRIG 1000ML POUR BTL (IV SOLUTION) ×2 IMPLANT
PACK TOTAL KNEE CUSTOM (KITS) ×2 IMPLANT
PADDING CAST COTTON 6X4 STRL (CAST SUPPLIES) ×1 IMPLANT
PROTECTOR NERVE ULNAR (MISCELLANEOUS) ×2 IMPLANT
SET HNDPC FAN SPRY TIP SCT (DISPOSABLE) ×1 IMPLANT
SPIKE FLUID TRANSFER (MISCELLANEOUS) ×1 IMPLANT
SUT MNCRL AB 3-0 PS2 18 (SUTURE) ×2 IMPLANT
SUT MON AB 2-0 CT1 36 (SUTURE) ×2 IMPLANT
SUT STRATAFIX PDO 1 14 VIOLET (SUTURE)
SUT STRATFX PDO 1 14 VIOLET (SUTURE)
SUT VIC AB 1 CT1 36 (SUTURE) ×2 IMPLANT
SUT VIC AB 2-0 CT1 27 (SUTURE) ×1
SUT VIC AB 2-0 CT1 TAPERPNT 27 (SUTURE) ×2 IMPLANT
SUTURE STRATFX PDO 1 14 VIOLET (SUTURE) ×1 IMPLANT
SWAB COLLECTION DEVICE MRSA (MISCELLANEOUS) ×2 IMPLANT
SWAB CULTURE ESWAB REG 1ML (MISCELLANEOUS) ×2 IMPLANT
SYR 50ML LL SCALE MARK (SYRINGE) ×2 IMPLANT
TRAY FOLEY MTR SLVR 16FR STAT (SET/KITS/TRAYS/PACK) ×1 IMPLANT
WRAP KNEE MAXI GEL POST OP (GAUZE/BANDAGES/DRESSINGS) ×1 IMPLANT

## 2022-06-04 NOTE — Anesthesia Postprocedure Evaluation (Signed)
Anesthesia Post Note  Patient: ORIYA KETTERING  Procedure(s) Performed: INCISION AND DRAINAGE (Left)     Patient location during evaluation: PACU Anesthesia Type: General Level of consciousness: awake and alert Pain management: pain level controlled Vital Signs Assessment: post-procedure vital signs reviewed and stable Respiratory status: spontaneous breathing, nonlabored ventilation, respiratory function stable and patient connected to nasal cannula oxygen Cardiovascular status: blood pressure returned to baseline and stable Postop Assessment: no apparent nausea or vomiting Anesthetic complications: no   No notable events documented.  Last Vitals:  Vitals:   06/04/22 1845 06/04/22 1900  BP: (!) 206/127 (!) 178/98  Pulse: 74 76  Resp: 15 10  Temp:    SpO2: 91% 91%    Last Pain:  Vitals:   06/04/22 1845  TempSrc:   PainSc: 0-No pain                 Farhiya Rosten S

## 2022-06-04 NOTE — Anesthesia Procedure Notes (Signed)
Procedure Name: LMA Insertion Date/Time: 06/04/2022 5:01 PM  Performed by: Cynda Familia, CRNAPre-anesthesia Checklist: Patient identified, Emergency Drugs available, Suction available and Patient being monitored Patient Re-evaluated:Patient Re-evaluated prior to induction Oxygen Delivery Method: Circle System Utilized Preoxygenation: Pre-oxygenation with 100% oxygen Induction Type: IV induction Ventilation: Mask ventilation without difficulty LMA: LMA inserted and LMA with gastric port inserted LMA Size: 4.0 Number of attempts: 1 Airway Equipment and Method: Bite block Placement Confirmation: positive ETCO2 Tube secured with: Tape Dental Injury: Teeth and Oropharynx as per pre-operative assessment  Comments: Smooth IV induction Rose- LMA insertion AM CRNA atraumatic-- no teeth as preop-- bilat BS

## 2022-06-04 NOTE — Op Note (Signed)
OPERATIVE REPORT   06/04/2022  6:02 PM  PATIENT:  Heather Ellis   SURGEON:  Bertram Savin, MD  ASSISTANT:  Larene Pickett, PA-C.   PREOPERATIVE DIAGNOSIS: Surgical wound dehiscence left knee  POSTOPERATIVE DIAGNOSIS:  Same.  PROCEDURE:  1.  Excisional debridement of skin and subcutaneous tissue left knee. 2.  Closure of complex wound totaling 8 cm. 3. Application of negative pressure incisional wound dressing.  ANESTHESIA:   GETA.  ANTIBIOTICS: 1 g vancomycin.  IMPLANTS: None.  SPECIMENS: Left knee superficial wound swab for aerobic and anaerobic culture.  COMPLICATIONS: None.  DISPOSITION: Stable to PACU.  SURGICAL INDICATIONS:  Heather Ellis is a 76 y.o. female who is immunocompromise from rheumatoid arthritis.  She previously underwent primary left total knee arthroplasty on 04/22/2022.  Her immunosuppressive medications were held appropriately preoperatively.  She was found to have an area of necrotic skin postoperatively with delayed wound healing.  There was no obvious evidence of wound infection.  In the office, her left knee was aspirated on 06/02/2022 showing 303 white blood cells with 28% neutrophils.  Neutrophil elastase negative.  Alpha defensin negative.  At the time of this dictation, the microbial ID panel and culture were pending.  She was indicated for debridement and closure.  The risks, benefits, and alternatives were discussed with the patient preoperatively including but not limited to the risks of infection, bleeding, nerve / blood vessel injury, cardiopulmonary complications, the need for repeat surgery, among others, and the patient was willing to proceed.  PROCEDURE IN DETAIL: The patient was correctly identified in the holding area using 2 identifiers.  The surgical site was marked by myself.  She was taken to the operating room, placed supine on the operating room table.  General anesthesia was induced.  All bony prominences were well-padded.   The left knee was prepped and draped in the normal sterile surgical fashion.  Timeout is called, verifying site and site of surgery.  She did receive IV vancomycin within 60 minutes of beginning the procedure.  I began by examining her left knee.  The superior and inferior aspects of the incision were well-healed.  In the central aspect of the incision, she had a 6 cm region of skin dehiscence.  The skin edges were nonviable.  There was no purulence or erythema.  Using a #10 blade, I excisionally debrided the involved skin in an elliptical fashion.  Nonviable subcutaneous fatty tissue was excisionally debrided with a rongeur.  There was no underlying fluid collection.  I took a culture swab of the wound bed which was sent to the lab.  Once I was satisfied with the debridement, the wound was irrigated with 3 L of normal saline using pulsatile lavage.  Undermining of the skin was performed with Mayo scissors in order to obtain a tension-free closure.  The underlying tissue was intact. The wound was then closed with a combination of 2-0 nylon vertical mattress sutures and skin staples.  13 cm Prevena dressing was applied and hooked up to suction at 75 mmHg.  There was no leak.  Bulky dressing was then applied with cast padding and an Ace wrap.  A knee immobilizer was applied.  The patient was then awakened from anesthesia and transferred to the PACU in stable condition.  Sponge, needle, and instrument counts were correct at the end of the case x2.  There were no known complications.  POSTOPERATIVE PLAN: Postoperatively, the patient be admitted to the orthopedic floor.  Knee immobilizer at  all times.  She may weight-bear as tolerated.  We will place her on IV vancomycin until the cultures are back.  Continue to hold immunosuppressive medications.  At the time of discharge, the house [facility] unit will be switched to the portable Prevena suction unit.  She will need to return to the office within 7 days for removal of  her negative pressure dressing.  Debridement type: Excisional Debridement  Side: left  Body Location: knee   Tools used for debridement: scalpel and rongeur  Pre-debridement Wound size (cm):   Length: 6        Width: 1.5     Depth: 0.6   Post-debridement Wound size (cm):   Length: 8        Width: 2     Depth: to fascia   Debridement depth beyond dead/damaged tissue down to healthy viable tissue: yes  Tissue layer involved: skin, subcutaneous tissue  Nature of tissue removed: Devitalized Tissue  Irrigation volume: 3L     Irrigation fluid type: Normal Saline

## 2022-06-04 NOTE — Transfer of Care (Signed)
Immediate Anesthesia Transfer of Care Note  Patient: Heather Ellis  Procedure(s) Performed: INCISION AND DRAINAGE (Left)  Patient Location: PACU  Anesthesia Type:General  Level of Consciousness: awake  Airway & Oxygen Therapy: Patient Spontanous Breathing and Patient connected to face mask oxygen  Post-op Assessment: Report given to RN and Post -op Vital signs reviewed and stable  Post vital signs: Reviewed and stable  Last Vitals:  Vitals Value Taken Time  BP 164/142 06/04/22 1809  Temp    Pulse 75 06/04/22 1811  Resp 13 06/04/22 1811  SpO2 100 % 06/04/22 1811  Vitals shown include unvalidated device data.  Last Pain:  Vitals:   06/04/22 1401  TempSrc:   PainSc: 0-No pain      Patients Stated Pain Goal: 3 (30/09/23 3007)  Complications: No notable events documented.

## 2022-06-04 NOTE — H&P (Signed)
PREOPERATIVE H&P  Chief Complaint: Wound dehiscence left knee  HPI: Heather Ellis is a 76 y.o. female who presents for preoperative history and physical with a diagnosis of Wound dehiscence left knee. Synovasure analysis from 10-3 shows normal cell count. She has elected for surgical management.   Past Medical History:  Diagnosis Date   Acid reflux    Anemia    Asthma    COPD (chronic obstructive pulmonary disease) (HCC)    Dyspnea    WITH EXERTION   H/O polymyalgia rheumatica    History of COVID-19 2020   Hypertension    Non-small cell lung cancer (HCC)    Right   OA (osteoarthritis)    Obesity    Osteoporosis    Pre-diabetes    Syncope    Syncope and collapse 08/2018   Vitamin D deficiency    Past Surgical History:  Procedure Laterality Date   ABDOMINAL HYSTERECTOMY     TOTAL ABDOMINAL HYSTERECTOMY    BRONCHIAL BIOPSY  05/12/2021   Procedure: BRONCHIAL BIOPSIES;  Surgeon: Collene Gobble, MD;  Location: [facility];  Service: Pulmonary;;   BRONCHIAL BRUSHINGS  05/12/2021   Procedure: BRONCHIAL BRUSHINGS;  Surgeon: Collene Gobble, MD;  Location: [place];  Service: Pulmonary;;   BRONCHIAL NEEDLE ASPIRATION BIOPSY  05/12/2021   Procedure: BRONCHIAL NEEDLE ASPIRATION BIOPSIES;  Surgeon: Collene Gobble, MD;  Location: [place] [facility];  Service: Pulmonary;;   COLONOSCOPY W/ BIOPSIES  2015   ESOPHAGOGASTRODUODENOSCOPY     FIDUCIAL MARKER PLACEMENT  05/12/2021   Procedure: FIDUCIAL MARKER PLACEMENT;  Surgeon: Collene Gobble, MD;  Location: [place];  Service: Pulmonary;;   KNEE ARTHROPLASTY Left 04/22/2022   Procedure: COMPUTER ASSISTED TOTAL KNEE ARTHROPLASTY;  Surgeon: Rod Can, MD;  Location: [facility];  Service: Orthopedics;  Laterality: Left;   OOPHORECTOMY     BSO   REVERSE SHOULDER ARTHROPLASTY Left 10/24/2021   Procedure: REVERSE SHOULDER ARTHROPLASTY;  Surgeon: Nicholes Stairs, MD;  Location: [facility];  Service: Orthopedics;  Laterality: Left;    TOTAL HIP ARTHROPLASTY Left 01/16/2020   Procedure: TOTAL HIP ARTHROPLASTY ANTERIOR APPROACH;  Surgeon: Renette Butters, MD;  Location: [facility];  Service: Orthopedics;  Laterality: Left;   VIDEO BRONCHOSCOPY WITH ENDOBRONCHIAL NAVIGATION Right 05/12/2021   Procedure: ROBOTIC ASSISTED BRONCHOSCOPY WITH ENDOBRONCHIAL NAVIGATION;  Surgeon: Collene Gobble, MD;  Location: [place] [facility];  Service: Pulmonary;  Laterality: Right;   VIDEO BRONCHOSCOPY WITH RADIAL ENDOBRONCHIAL ULTRASOUND  05/12/2021   Procedure: VIDEO BRONCHOSCOPY WITH RADIAL ENDOBRONCHIAL ULTRASOUND;  Surgeon: Collene Gobble, MD;  Location: [facility];  Service: Pulmonary;;   Social History   Socioeconomic History   Marital status: Legally Separated    Spouse name: Not on file   Number of children: Not on file   Years of education: Not on file   Highest education level: Not on file  Occupational History   Not on file  Tobacco Use   Smoking status: Former    Types: Cigarettes    Quit date: 10/19/2014    Years since quitting: 7.6   Smokeless tobacco: Never  Vaping Use   Vaping Use: Never used  Substance and Sexual Activity   Alcohol use: Not Currently   Drug use: No   Sexual activity: Never    Birth control/protection: Surgical    Comment: 1st intercourse 36 yo-1 partner  Other Topics Concern   Not on file  Social History Narrative   Not on file   Social Determinants of Health  Financial Resource Strain: Not on file  Food Insecurity: Not on file  Transportation Needs: Not on file  Physical Activity: Not on file  Stress: Not on file  Social Connections: Not on file   Family History  Problem Relation Age of Onset   Cancer Mother        THROAT- SMOKER   Diabetes Mother    Seizures Mother    Syncope episode Mother    Diabetes Sister    Heart disease Maternal Aunt    Stroke Maternal Aunt    Cancer Maternal Uncle        LUNG -    Heart disease Maternal Uncle    Stroke Maternal Uncle    Ovarian cancer  Maternal Grandmother    Heart attack Father    Heart disease Father    Syncope episode Brother    Allergies  Allergen Reactions   Fosamax [Alendronate Sodium] Nausea And Vomiting   Prior to Admission medications   Medication Sig Start Date End Date Taking? Authorizing Provider  apixaban (ELIQUIS) 2.5 MG TABS tablet Take 1 tablet (2.5 mg total) by mouth 2 (two) times daily. 04/23/22  Yes Hill, Marciano Sequin, PA-C  bisoprolol-hydrochlorothiazide (ZIAC) 5-6.25 MG tablet Take 2 tablets by mouth in the morning. 09/27/19  Yes [provider]  cholecalciferol (VITAMIN D) 1000 UNITS tablet Take 1,000 Units by mouth daily in the afternoon.   Yes [provider]  lisinopril (ZESTRIL) 40 MG tablet Take 40 mg by mouth daily. 12/21/19  Yes [provider]  Menthol, Topical Analgesic, (BIOFREEZE EX) Apply 1 application topically daily as needed (soreness).   Yes [provider]  omeprazole (PRILOSEC) 20 MG capsule Take 20 mg by mouth daily as needed (acid reflux/heartburn).   Yes [provider]  umeclidinium-vilanterol (ANORO ELLIPTA) 62.5-25 MCG/ACT AEPB Inhale 1 puff into the lungs daily. 03/16/22  Yes Martyn Ehrich, NP  albuterol (VENTOLIN HFA) 108 (90 Base) MCG/ACT inhaler Inhale 2 puffs into the lungs every 6 (six) hours as needed for wheezing or shortness of breath.    [provider]  folic acid (FOLVITE) 1 MG tablet Take 1 mg by mouth daily. 09/11/18   [provider]  meloxicam (MOBIC) 15 MG tablet Take 1 tablet (15 mg total) by mouth daily. 04/23/22 04/23/23  Charlott Rakes, PA-C  ondansetron (ZOFRAN) 4 MG tablet Take 1 tablet (4 mg total) by mouth every 8 (eight) hours as needed for nausea or vomiting. 04/23/22 04/23/23  Charlott Rakes, PA-C     Positive ROS: All other systems have been reviewed and were otherwise negative with the exception of those mentioned in the HPI and as above.  Physical Exam: General: Alert, no acute  distress Cardiovascular: No pedal edema Respiratory: No cyanosis, no use of accessory musculature GI: No organomegaly, abdomen is soft and non-tender Skin: No lesions in the area of chief complaint Neurologic: Sensation intact distally Psychiatric: Patient is competent for consent with normal mood and affect Lymphatic: No axillary or cervical lymphadenopathy  MUSCULOSKELETAL: L knee skin dehiscence in center of incision  Assessment: Wound dehiscence left knee  Plan: Plan for Procedure(s): IRRIGATION AND DEBRIDEMENT LEFT KNEE, POSSIBLE POLY LINER EXCHANGE  The risks benefits and alternatives were discussed with the patient including but not limited to the risks of nonoperative treatment, versus surgical intervention including infection, bleeding, nerve injury,  blood clots, cardiopulmonary complications, morbidity, mortality, among others, and they were willing to proceed.   Bertram Savin, MD [phone]  [phone]   06/04/2022 2:10 PM

## 2022-06-04 NOTE — Progress Notes (Signed)
Pharmacy Antibiotic Note  Heather Ellis is a 76 y.o. female s/p left TKA on 04/22/22 and now has left knee wound dehiscence who presented to [facility] on 06/04/2022 for I&D of area.  Pharmacy has been consulted to dose vancomycin for infection.  - patient received vancomycin 1 gm preop at  2p  Plan: - vancomycin 1000 mg IV q24h for est AUC 455  ____________________________________  Height: 5\' 3"  (160 cm) Weight: 68.5 kg (151 lb) IBW/kg (Calculated) : 52.4  Temp (24hrs), Avg:98.5 F (36.9 C), Min:98.4 F (36.9 C), Max:98.6 F (37 C)  Recent Labs  Lab 06/04/22 1330  WBC 4.0  CREATININE 0.72    Estimated Creatinine Clearance: 54.7 mL/min (by C-G formula based on SCr of 0.72 mg/dL).    Allergies  Allergen Reactions   Fosamax [Alendronate Sodium] Nausea And Vomiting     Thank you for allowing pharmacy to be a part of this patient's care.  Lynelle Doctor 06/04/2022 8:05 PM

## 2022-06-04 NOTE — Discharge Instructions (Signed)
Dr. Rod Can Total Joint Specialist [facility] [address] [place], [place] [address] [phone]  TOTAL KNEE REPLACEMENT POSTOPERATIVE DIRECTIONS    Knee Rehabilitation, Guidelines Following Surgery  Results after knee surgery are often greatly improved when you follow the exercise, range of motion and muscle strengthening exercises prescribed by your doctor. Safety measures are also important to protect the knee from further injury. Any time any of these exercises cause you to have increased pain or swelling in your knee joint, decrease the amount until you are comfortable again and slowly increase them. If you have problems or questions, call your caregiver or physical therapist for advice.   WEIGHT BEARING Weight bearing as tolerated with assist device (walker, cane, etc) as directed, use it as long as suggested by your surgeon or therapist, typically at least 4-6 weeks. Knee immobilizer at all times.   HOME CARE INSTRUCTIONS  Remove items at home which could result in a fall. This includes throw rugs or furniture in walking pathways.  Continue medications as instructed at time of discharge. You may have some home medications which will be placed on hold until you complete the course of blood thinner medication.  Walk with walker as instructed.  You may resume a sexual relationship in one month or when given the OK by your doctor.  Use walker as long as suggested by your caregivers. Avoid periods of inactivity such as sitting longer than an hour when not asleep. This helps prevent blood clots.  You may put full weight on your legs and walk as much as is comfortable.  You may return to work once you are cleared by your doctor.  Do not drive a car for 6 weeks or until released by you surgeon.  Do not drive while taking narcotics.  Wear the elastic stockings for three weeks following surgery during the day but you may remove then at night. Make  sure you keep all of your appointments after your operation with all of your doctors and caregivers. You should call the office at the above phone number and make an appointment for approximately two weeks after the date of your surgery. Do not remove your surgical dressing. Keep dressing clean and dry.  Please pick up a stool softener and laxative for home use as long as you are requiring pain medications. ICE to the affected knee every three hours for 30 minutes at a time and then as needed for pain and swelling.  Continue to use ice on the knee for pain and swelling from surgery. You may notice swelling that will progress down to the foot and ankle.  This is normal after surgery.  Elevate the leg when you are not up walking on it.   It is important for you to complete the blood thinner medication as prescribed by your doctor. Continue to use the breathing machine which will help keep your temperature down.  It is common for your temperature to cycle up and down following surgery, especially at night when you are not up moving around and exerting yourself.  The breathing machine keeps your lungs expanded and your temperature down.  RANGE OF MOTION AND STRENGTHENING EXERCISES  Rehabilitation of the knee is important following a knee injury or an operation. After just a few days of immobilization, the muscles of the thigh which control the knee become weakened and shrink (atrophy). Knee exercises are designed to build up the tone and strength of the thigh muscles and to  improve knee motion. Often times heat used for twenty to thirty minutes before working out will loosen up your tissues and help with improving the range of motion but do not use heat for the first two weeks following surgery. These exercises can be done on a training (exercise) mat, on the floor, on a table or on a bed. Use what ever works the best and is most comfortable for you Knee exercises include:  Leg Lifts - While your knee is still  immobilized in a splint or cast, you can do straight leg raises. Lift the leg to 60 degrees, hold for 3 sec, and slowly lower the leg. Repeat 10-20 times 2-3 times daily. Perform this exercise against resistance later as your knee gets better.  Quad and Hamstring Sets - Tighten up the muscle on the front of the thigh (Quad) and hold for 5-10 sec. Repeat this 10-20 times hourly. Hamstring sets are done by pushing the foot backward against an object and holding for 5-10 sec. Repeat as with quad sets.  A rehabilitation program following serious knee injuries can speed recovery and prevent re-injury in the future due to weakened muscles. Contact your doctor or a physical therapist for more information on knee rehabilitation.   POST-OPERATIVE OPIOID TAPER INSTRUCTIONS: It is important to wean off of your opioid medication as soon as possible. If you do not need pain medication after your surgery it is ok to stop day one. Opioids include: Codeine, Hydrocodone(Norco, Vicodin), Oxycodone(Percocet, oxycontin) and hydromorphone amongst others.  Long term and even short term use of opiods can cause: Increased pain response Dependence Constipation Depression Respiratory depression And more.  Withdrawal symptoms can include Flu like symptoms Nausea, vomiting And more Techniques to manage these symptoms Hydrate well Eat regular healthy meals Stay active Use relaxation techniques(deep breathing, meditating, yoga) Do Not substitute Alcohol to help with tapering If you have been on opioids for less than two weeks and do not have pain than it is ok to stop all together.  Plan to wean off of opioids This plan should start within one week post op of your joint replacement. Maintain the same interval or time between taking each dose and first decrease the dose.  Cut the total daily intake of opioids by one tablet each day Next start to increase the time between doses. The last dose that should be  eliminated is the evening dose.      MAKE SURE YOU:  Understand these instructions.  Will watch your condition.  Will get help right away if you are not doing well or get worse.    Pick up stool softner and laxative for home use following surgery while on pain medications. Do NOT remove your dressing.  Keep dressing clean and dry. Do not take tub baths or submerge incision under water. Continue to use ice for pain and swelling after surgery. Do not use any lotions or creams on the incision until instructed by your surgeon. Please charge your Prevena wound vac nightly. Wear Knee Immobilizer at all times.  Follow-up in the office within 7 days of discharge from the hospital for removal of Prevena wound vac.

## 2022-06-04 NOTE — Anesthesia Preprocedure Evaluation (Signed)
Anesthesia Evaluation  Patient identified by MRN, date of birth, ID band Patient awake    Reviewed: Allergy & Precautions, NPO status , Patient's Chart, lab work & pertinent test results  Airway Mallampati: II  TM Distance: >3 FB Neck ROM: Full    Dental no notable dental hx.    Pulmonary asthma , COPD,  COPD inhaler, former smoker,    Pulmonary exam normal breath sounds clear to auscultation       Cardiovascular hypertension, Pt. on medications Normal cardiovascular exam Rhythm:Regular Rate:Normal     Neuro/Psych negative neurological ROS  negative psych ROS   GI/Hepatic Neg liver ROS, GERD  Medicated,  Endo/Other  negative endocrine ROS  Renal/GU negative Renal ROS  negative genitourinary   Musculoskeletal negative musculoskeletal ROS (+)   Abdominal   Peds negative pediatric ROS (+)  Hematology  (+) Blood dyscrasia, anemia ,   Anesthesia Other Findings   Reproductive/Obstetrics negative OB ROS                             Anesthesia Physical Anesthesia Plan  ASA: 3  Anesthesia Plan: General   Post-op Pain Management: Minimal or no pain anticipated   Induction: Intravenous  PONV Risk Score and Plan: 3 and Ondansetron, Dexamethasone and Treatment may vary due to age or medical condition  Airway Management Planned: LMA  Additional Equipment:   Intra-op Plan:   Post-operative Plan: Extubation in OR  Informed Consent: I have reviewed the patients History and Physical, chart, labs and discussed the procedure including the risks, benefits and alternatives for the proposed anesthesia with the patient or authorized representative who has indicated his/her understanding and acceptance.     Dental advisory given  Plan Discussed with: CRNA and Surgeon  Anesthesia Plan Comments:         Anesthesia Quick Evaluation

## 2022-06-05 ENCOUNTER — Encounter (HOSPITAL_COMMUNITY): Payer: Self-pay | Admitting: Orthopedic Surgery

## 2022-06-05 LAB — CBC
HCT: 33 % — ABNORMAL LOW (ref 36.0–46.0)
Hemoglobin: 10 g/dL — ABNORMAL LOW (ref 12.0–15.0)
MCH: 28.7 pg (ref 26.0–34.0)
MCHC: 30.3 g/dL (ref 30.0–36.0)
MCV: 94.6 fL (ref 80.0–100.0)
Platelets: 218 10*3/uL (ref 150–400)
RBC: 3.49 MIL/uL — ABNORMAL LOW (ref 3.87–5.11)
RDW: 16.6 % — ABNORMAL HIGH (ref 11.5–15.5)
WBC: 3.9 10*3/uL — ABNORMAL LOW (ref 4.0–10.5)
nRBC: 0 % (ref 0.0–0.2)

## 2022-06-05 LAB — BASIC METABOLIC PANEL
Anion gap: 9 (ref 5–15)
BUN: 17 mg/dL (ref 8–23)
CO2: 30 mmol/L (ref 22–32)
Calcium: 9.7 mg/dL (ref 8.9–10.3)
Chloride: 103 mmol/L (ref 98–111)
Creatinine, Ser: 0.86 mg/dL (ref 0.44–1.00)
GFR, Estimated: >60 mL/min (ref >60)
Glucose, Bld: 170 mg/dL — ABNORMAL HIGH (ref 70–99)
Potassium: 4.2 mmol/L (ref 3.5–5.1)
Sodium: 142 mmol/L (ref 135–145)

## 2022-06-05 LAB — MRSA NEXT GEN BY PCR, NASAL: MRSA by PCR Next Gen: DETECTED — AB

## 2022-06-05 MED ORDER — DOXYCYCLINE HYCLATE 100 MG PO CAPS: 100.0000 mg | ORAL_CAPSULE | Freq: Two times a day (BID) | ORAL | 0 refills | Status: AC

## 2022-06-05 MED ORDER — MUPIROCIN 2 % EX OINT
1.0000 | TOPICAL_OINTMENT | Freq: Two times a day (BID) | CUTANEOUS | Status: DC
  Administered 2022-06-05 – 2022-06-06 (×4): 1 via NASAL
  Filled 2022-06-05: qty 22

## 2022-06-05 MED ORDER — CHLORHEXIDINE GLUCONATE CLOTH 2 % EX PADS
6.0000 | MEDICATED_PAD | Freq: Every day | CUTANEOUS | Status: DC
  Administered 2022-06-05 – 2022-06-06 (×2): 6 via TOPICAL

## 2022-06-05 MED ORDER — OXYCODONE HCL 5 MG PO TABS: 5.0000 mg | ORAL_TABLET | ORAL | 0 refills | Status: AC | PRN

## 2022-06-05 MED ORDER — SODIUM CHLORIDE 0.9 % IV SOLN: INTRAVENOUS | Status: DC | PRN

## 2022-06-05 NOTE — Plan of Care (Signed)
  Problem: Education: Goal: Knowledge of General Education information will improve Description Including pain rating scale, medication(s)/side effects and non-pharmacologic comfort measures Outcome: Progressing   Problem: Health Behavior/Discharge Planning: Goal: Ability to manage health-related needs will improve Outcome: Progressing   

## 2022-06-05 NOTE — Progress Notes (Addendum)
    Subjective:  Patient reports pain as mild to moderate.  Denies N/V/CP/SOB/Abd pain. She states that her pain is much better. Denies tingling and numbness in LE bilaterally.   Objective:   VITALS:   Vitals:   06/04/22 1941 06/04/22 2146 06/05/22 0142 06/05/22 0518  BP: (!) 150/108 (!) 159/86 (!) 167/87 (!) 151/85  Pulse: 86 87 81 79  Resp: 17 17 17 17   Temp: 98.4 F (36.9 C) 98.5 F (36.9 C) 98.7 F (37.1 C) 98.2 F (36.8 C)  TempSrc:      SpO2: 98% 96% 96% 95%  Weight:      Height:        Patient lying comfortably in bed. NAD.  Neurologically intact ABD soft Neurovascular intact Sensation intact distally Intact pulses distally Dorsiflexion/Plantar flexion intact No cellulitis present Compartment soft Prevena negative pressure dressing C/D/I. House vac was not plugged in once patient got to the floor and overnight the battery died. Plugged in house vac this morning and checked seal. Seal has no leaks and is 75 mmHg.   Lab Results  Component Value Date   WBC 3.9 (L) 06/05/2022   HGB 10.0 (L) 06/05/2022   HCT 33.0 (L) 06/05/2022   MCV 94.6 06/05/2022   PLT 218 06/05/2022   BMET    Component Value Date/Time   NA 142 06/05/2022 0331   NA 146 (H) 10/26/2018 0816   K 4.2 06/05/2022 0331   CL 103 06/05/2022 0331   CO2 30 06/05/2022 0331   GLUCOSE 170 (H) 06/05/2022 0331   BUN 17 06/05/2022 0331   BUN 17 10/26/2018 0816   CREATININE 0.86 06/05/2022 0331   CREATININE 0.65 03/26/2022 1127   CALCIUM 9.7 06/05/2022 0331   GFRNONAA >60 06/05/2022 0331   GFRNONAA >60 03/26/2022 1127     Assessment/Plan: 1 Day Post-Op   Principal Problem:   Surgical wound dehiscence   WBAT with walker with Knee immobilizer at all times.  DVT ppx: SCDs, TEDS PO pain control PT/OT: PT has not seen yet. PT to come by today.  Dispo:  - D/c home tomorrow and once cleared with PT and can ambulate with Knee immobilizer.  - Will discharge on PO doxycycline for 14 days.  -  Continue to hold immunosuppressive medication.  - At time of discharge, the house [facility] unit will be switched to the portable Prevena suction unit.  - Follow-up in the office Tuesday 06/09/22, for removal of Prevena negative pressure dressing.     Charlott Rakes, PA-C 06/05/2022, 7:51 AM   [facility]  [place] [address], [place], [place] [address] Phone: [phone] [email or web address]  [employer]

## 2022-06-05 NOTE — Plan of Care (Signed)

## 2022-06-05 NOTE — Evaluation (Signed)
Physical Therapy Evaluation Patient Details Name: Heather Ellis MRN: [id number] DOB: September 30, 1945 Today's Date: 06/05/2022  History of Present Illness  76 yo s/p I&D and closure Surgical wound dehiscence left knee on 06/04/22.  PMH: RA, L THA, L rTSA, covid, COPD, L TKA  Clinical Impression  Pt admitted with above diagnosis.  Pt mobilizing well this am, amb ~ 160' with RW and min/guard assist, denies pain. Will continue to follow ina cute setting.   Pt currently with functional limitations due to the deficits listed below (see PT Problem List). Pt will benefit from skilled PT to increase their independence and safety with mobility to allow discharge to the venue listed below.          Recommendations for follow up therapy are one component of a multi-disciplinary discharge planning process, led by the attending physician.  Recommendations may be updated based on patient status, additional functional criteria and insurance authorization.  Follow Up Recommendations Follow physician's recommendations for discharge plan and follow up therapies      Assistance Recommended at Discharge Intermittent Supervision/Assistance  Patient can return home with the following  A little help with walking and/or transfers;A little help with bathing/dressing/bathroom;Help with stairs or ramp for entrance;Assist for transportation;Assistance with cooking/housework    Equipment Recommendations None recommended by PT  Recommendations for Other Services       Functional Status Assessment Patient has had a recent decline in their functional status and demonstrates the ability to make significant improvements in function in a reasonable and predictable amount of time.     Precautions / Restrictions Precautions Precautions: Fall Precaution Comments: assumed no early ROM L knee d/t wound deshiscence and closure/VAC in place Required Braces or Orthoses: Knee Immobilizer - Left Knee Immobilizer - Left: On at  all times Restrictions Weight Bearing Restrictions: No LLE Weight Bearing: Weight bearing as tolerated      Mobility  Bed Mobility Overal bed mobility: Needs Assistance Bed Mobility: Supine to Sit     Supine to sit: Min assist     General bed mobility comments: assist with LLE,incr time    Transfers Overall transfer level: Needs assistance Equipment used: Rolling walker (2 wheels) Transfers: Sit to/from Stand Sit to Stand: Min guard, Min assist           General transfer comment: cues for hand placement and assist to rise and transition to RW    Ambulation/Gait Ambulation/Gait assistance: Min guard Gait Distance (Feet): 160 Feet Assistive device: Rolling walker (2 wheels) Gait Pattern/deviations: Step-to pattern, Step-through pattern       General Gait Details: cues for initial sequence, good stability with RW support, without overt LOB  Stairs            Wheelchair Mobility    Modified Rankin (Stroke Patients Only)       Balance Overall balance assessment: Mild deficits observed, not formally tested (denies falls)                                           Pertinent Vitals/Pain Pain Assessment Pain Assessment: No/denies pain    Home Living Family/patient expects to be discharged to:: Private residence Living Arrangements: Children Available Help at Discharge: Available PRN/intermittently Type of Home: House Home Access: Stairs to enter   [employer] of Steps: 1   Home Layout: One level Home Equipment: Conservation officer, nature (2 wheels);Cane - single  point;BSC/3in1 Additional Comments: dtr staying until Tuesday    Prior Function Prior Level of Function : Independent/Modified Independent             Mobility Comments: amb with cane  or RW at times       Hand Dominance        Extremity/Trunk Assessment   Upper Extremity Assessment Upper Extremity Assessment: Overall WFL for tasks assessed    Lower  Extremity Assessment Lower Extremity Assessment: LLE deficits/detail LLE Deficits / Details: ankle WFL, knee extension and hip flexion grossly 2+/5 (KI in place)       Communication   Communication: No difficulties  Cognition Arousal/Alertness: Awake/alert   Overall Cognitive Status: Within Functional Limits for tasks assessed                                          General Comments      Exercises General Exercises - Lower Extremity Ankle Circles/Pumps: AROM, Both, 10 reps   Assessment/Plan    PT Assessment Patient needs continued PT services  PT Problem List Decreased strength;Decreased range of motion;Decreased activity tolerance;Pain;Decreased knowledge of use of DME;Decreased mobility       PT Treatment Interventions DME instruction;Therapeutic exercise;Gait training;Functional mobility training;Therapeutic activities;Patient/family education;Stair training    PT Goals (Current goals can be found in the Care Plan section)  Acute Rehab PT Goals Patient Stated Goal: home soon PT Goal Formulation: With patient Time For Goal Achievement: 06/19/22 Potential to Achieve Goals: Good    Frequency Min 6X/week     Co-evaluation               AM-PAC PT "6 Clicks" Mobility  Outcome Measure Help needed turning from your back to your side while in a flat bed without using bedrails?: A Little Help needed moving from lying on your back to sitting on the side of a flat bed without using bedrails?: A Little Help needed moving to and from a bed to a chair (including a wheelchair)?: A Little Help needed standing up from a chair using your arms (e.g., wheelchair or bedside chair)?: A Little Help needed to walk in hospital room?: A Little Help needed climbing 3-5 steps with a railing? : A Little 6 Click Score: 18    End of Session Equipment Utilized During Treatment: Gait belt Activity Tolerance: Patient tolerated treatment well Patient left: with call  bell/phone within reach;in chair;with chair alarm set   PT Visit Diagnosis: Other abnormalities of gait and mobility (R26.89);Difficulty in walking, not elsewhere classified (R26.2)    Time: 0321-2248 PT Time Calculation (min) (ACUTE ONLY): 21 min   Charges:   PT Evaluation $PT Eval Low Complexity: [place], PT  Acute Rehab Dept [facility]) [phone]  [facility] Weekend Pager [facility] only)  [phone]  06/05/2022   [facility] 06/05/2022, 11:04 AM

## 2022-06-06 LAB — CBC
HCT: 29.8 % — ABNORMAL LOW (ref 36.0–46.0)
Hemoglobin: 9 g/dL — ABNORMAL LOW (ref 12.0–15.0)
MCH: 28.8 pg (ref 26.0–34.0)
MCHC: 30.2 g/dL (ref 30.0–36.0)
MCV: 95.5 fL (ref 80.0–100.0)
Platelets: 174 10*3/uL (ref 150–400)
RBC: 3.12 MIL/uL — ABNORMAL LOW (ref 3.87–5.11)
RDW: 17 % — ABNORMAL HIGH (ref 11.5–15.5)
WBC: 4.8 10*3/uL (ref 4.0–10.5)
nRBC: 0 % (ref 0.0–0.2)

## 2022-06-06 NOTE — Progress Notes (Signed)
The patient is alert and oriented and has been seen by her physician. The orders for discharge were written. IV has been removed. Went over discharge instructions with patient and family. She is being discharged via wheelchair with all of her belongings.  

## 2022-06-06 NOTE — TOC Progression Note (Signed)
Transition of Care [facility]) - Progression Note    Patient Details  Name: Heather Ellis MRN: [id number] Date of Birth: [birth year]  Transition of Care [facility]) CM/SW Contact  Henrietta Dine, RN Phone Number: 06/06/2022, 10:03 AM  Clinical Narrative:   spoke with pt because no PCP listed; the pt says she has a PCP on [facility], [place] but she cannot pronounce her name; no TOC needs. Transition of Care [facility]) Screening Note   Patient Details  Name: Heather Ellis Date of Birth: 12-13-1945   Transition of Care [facility]) CM/SW Contact:    Henrietta Dine, RN Phone Number: 06/06/2022, 10:04 AM      Expected Discharge Plan and Services           Expected Discharge Date: 06/06/22                                     Social Determinants of Health (SDOH) Interventions Food Insecurity Interventions: Patient Refused Utilities Interventions: Patient Refused  Readmission Risk Interventions     No data to display

## 2022-06-06 NOTE — Plan of Care (Signed)

## 2022-06-06 NOTE — Progress Notes (Signed)
Subjective: 2 Days Post-Op Procedure(s) (LRB): INCISION AND DRAINAGE (Left) Patient seen in rounds for Dr. Lyla Glassing Patient reports pain as mild.   Denies any N/V, SOB/CP Denies any numbness or tingling in left lower extremity  Objective: Vital signs in last 24 hours: Temp:  [98 F (36.7 C)-98.8 F (37.1 C)] 98 F (36.7 C) (10/07 0519) Pulse Rate:  [72-84] 72 (10/07 0519) Resp:  [18] 18 (10/07 0519) BP: (137-178)/(80-90) 168/83 (10/07 0519) SpO2:  [95 %-96 %] 95 % (10/07 0519)  Intake/Output from previous day: 10/06 0701 - 10/07 0700 In: 1046.9 [P.O.:840; I.V.:6.9; IV Piggyback:200] Out: 200 [Urine:200] Intake/Output this shift: No intake/output data recorded.  Recent Labs    06/04/22 1330 06/05/22 0331  HGB 10.1* 10.0*   Recent Labs    06/04/22 1330 06/05/22 0331  WBC 4.0 3.9*  RBC 3.51* 3.49*  HCT 32.5* 33.0*  PLT 205 218   Recent Labs    06/04/22 1330 06/05/22 0331  NA 140 142  K 3.3* 4.2  CL 105 103  CO2 29 30  BUN 17 17  CREATININE 0.72 0.86  GLUCOSE 94 170*  CALCIUM 9.4 9.7   No results for input(s): "LABPT", "INR" in the last 72 hours.  Neurologically intact Neurovascular intact Sensation intact distally Intact pulses distally Dorsiflexion/Plantar flexion intact Incision: dressing C/D/I Compartment soft   Assessment/Plan: 2 Days Post-Op Procedure(s) (LRB): INCISION AND DRAINAGE (Left) Up with therapy as long as patient does well with physical therapy today she is okay to be discharged Weightbearing as tolerated with walker and knee immobilizer at all times Doxycycline is already been sent to patient's pharmacy Need to switch house VAC to portable Prevena suction unit Follow up in office on Tuesday 10/10    Derrick Ravel [phone] 06/06/2022, 8:03 AM

## 2022-06-06 NOTE — Progress Notes (Signed)
Physical Therapy Treatment Patient Details Name: Heather Ellis MRN: [id number] DOB: April 22, 1946 Today's Date: 06/06/2022   History of Present Illness 76 yo s/p I&D and closure Surgical wound dehiscence left knee on 06/04/22.  PMH: RA, L THA, L rTSA, covid, COPD, L TKA    PT Comments    Pt assisted with ambulating in hallway and remained in recliner end of session.  Pt reports only having a threshold to enter home and declined needing to practice a step.  Pt does not feel ready for d/c however she states this is because she can't rest at home.  Pt educated on keeping L LE knee straight (no flexion) and wearing KI at all times (making sure of snug fit when mobilizing).    Recommendations for follow up therapy are one component of a multi-disciplinary discharge planning process, led by the attending physician.  Recommendations may be updated based on patient status, additional functional criteria and insurance authorization.  Follow Up Recommendations  Follow physician's recommendations for discharge plan and follow up therapies     Assistance Recommended at Discharge Intermittent Supervision/Assistance  Patient can return home with the following A little help with walking and/or transfers;A little help with bathing/dressing/bathroom;Help with stairs or ramp for entrance;Assist for transportation;Assistance with cooking/housework   Equipment Recommendations  None recommended by PT    Recommendations for Other Services       Precautions / Restrictions Precautions Precautions: Fall Precaution Comments: assumed no early ROM L knee d/t wound deshiscence and closure/VAC in place Required Braces or Orthoses: Knee Immobilizer - Left Knee Immobilizer - Left: On at all times Restrictions Weight Bearing Restrictions: No LLE Weight Bearing: Weight bearing as tolerated     Mobility  Bed Mobility Overal bed mobility: Needs Assistance Bed Mobility: Supine to Sit     Supine to sit: Min  assist, Min guard     General bed mobility comments: assist with LLE, incr time    Transfers Overall transfer level: Needs assistance Equipment used: Rolling walker (2 wheels) Transfers: Sit to/from Stand Sit to Stand: Min guard           General transfer comment: cues for hand placement, increased time and effort to rise    Ambulation/Gait Ambulation/Gait assistance: Min guard Gait Distance (Feet): 140 Feet Assistive device: Rolling walker (2 wheels) Gait Pattern/deviations: Step-to pattern Gait velocity: decreased     General Gait Details: good stability with RW support, no overt LOB observed   Stairs Stairs:  (pt reports only having a threshold and declined needing to practice a step)           Wheelchair Mobility    Modified Rankin (Stroke Patients Only)       Balance                                            Cognition Arousal/Alertness: Awake/alert Behavior During Therapy: WFL for tasks assessed/performed Overall Cognitive Status: Within Functional Limits for tasks assessed                                          Exercises      General Comments        Pertinent Vitals/Pain Pain Assessment Pain Assessment: No/denies pain    Home Living  Prior Function            PT Goals (current goals can now be found in the care plan section) Progress towards PT goals: Progressing toward goals    Frequency    Min 6X/week      PT Plan Current plan remains appropriate    Co-evaluation              AM-PAC PT "6 Clicks" Mobility   Outcome Measure  Help needed turning from your back to your side while in a flat bed without using bedrails?: A Little Help needed moving from lying on your back to sitting on the side of a flat bed without using bedrails?: A Little Help needed moving to and from a bed to a chair (including a wheelchair)?: A Little Help needed standing  up from a chair using your arms (e.g., wheelchair or bedside chair)?: A Little Help needed to walk in hospital room?: A Little Help needed climbing 3-5 steps with a railing? : A Little 6 Click Score: 18    End of Session Equipment Utilized During Treatment: Gait belt Activity Tolerance: Patient tolerated treatment well Patient left: in chair;with call bell/phone within reach;with chair alarm set;with family/visitor present   PT Visit Diagnosis: Other abnormalities of gait and mobility (R26.89);Difficulty in walking, not elsewhere classified (R26.2)     Time: 1005-1020 PT Time Calculation (min) (ACUTE ONLY): 15 min  Charges:  $Gait Training: 8-22 mins                     {Kati PT, DPT Physical Therapist Acute Rehabilitation Services Preferred contact method: Secure Chat Weekend Pager Only: [phone] Office: [place] 06/06/2022, 12:02 PM

## 2022-06-09 LAB — AEROBIC/ANAEROBIC CULTURE W GRAM STAIN (SURGICAL/DEEP WOUND)
Culture: NO GROWTH
Gram Stain: NONE SEEN

## 2022-06-16 NOTE — Discharge Summary (Signed)
Physician Discharge Summary  Patient ID: Heather Ellis MRN: [id number] DOB/AGE: 1945-10-17 76 y.o.  Admit date: 06/04/2022 Discharge date: 06/06/2022  Admission Diagnoses:  Surgical wound dehiscence  Discharge Diagnoses:  Principal Problem:   Surgical wound dehiscence   Past Medical History:  Diagnosis Date   Acid reflux    Anemia    Asthma    COPD (chronic obstructive pulmonary disease) (HCC)    Dyspnea    WITH EXERTION   H/O polymyalgia rheumatica    History of COVID-19 2020   Hypertension    Non-small cell lung cancer ([place])    Right   OA (osteoarthritis)    Obesity    Osteoporosis    Pre-diabetes    Syncope    Syncope and collapse 08/2018   Vitamin D deficiency     Surgeries: Procedure(s): INCISION AND DRAINAGE on 06/04/2022   Consultants (if any):   Discharged Condition: Improved  Hospital Course: RASHEKA DENARD is an 76 y.o. female who was admitted 06/04/2022 with a diagnosis of Surgical wound dehiscence and went to the operating room on 06/04/2022 and underwent the above named procedures.    She was given perioperative antibiotics:  Anti-infectives (From admission, onward)    Start     Dose/Rate Route Frequency Ordered Stop   06/05/22 1000  vancomycin (VANCOCIN) IVPB 1000 mg/200 mL premix  Status:  Discontinued        1,000 mg 200 mL/hr over 60 Minutes Intravenous Every 24 hours 06/04/22 2013 06/06/22 1844   06/05/22 0000  doxycycline (VIBRAMYCIN) 100 MG capsule        100 mg Oral 2 times daily 06/05/22 0832 06/19/22 2359   06/04/22 1315  ceFAZolin (ANCEF) IVPB 2g/100 mL premix        2 g 200 mL/hr over 30 Minutes Intravenous On call to O.R. 06/04/22 1311 06/04/22 1702   06/04/22 1315  vancomycin (VANCOCIN) IVPB 1000 mg/200 mL premix        1,000 mg 200 mL/hr over 60 Minutes Intravenous On call to O.R. 06/04/22 1311 06/04/22 1500       She was given sequential compression devices, early ambulation, and SCDs for DVT  prophylaxis.   Postoperatively she had a prevena negative pressure dressing and knee immobilizer at all times. She was given IV vancomycin during hospital stay. Continued to hold immunosuppressive medications. POD#1 She ambulated 160 feet with PT.  POD#2 She was discharged on PO doxycycline for 14 days. At the time of discharge, the house [facility] unit was switched to the portable Prevena suction unit.    She benefited maximally from the hospital stay and there were no complications.    Recent vital signs:  Vitals:   06/06/22 0835 06/06/22 1002  BP:  (!) 149/77  Pulse:  85  Resp:  15  Temp:  98.3 F (36.8 C)  SpO2: 97% 94%    Recent laboratory studies:  Lab Results  Component Value Date   HGB 9.0 (L) 06/06/2022   HGB 10.0 (L) 06/05/2022   HGB 10.1 (L) 06/04/2022   Lab Results  Component Value Date   WBC 4.8 06/06/2022   PLT 174 06/06/2022   Lab Results  Component Value Date   INR 1.1 (H) 04/04/2021   Lab Results  Component Value Date   NA 142 06/05/2022   K 4.2 06/05/2022   CL 103 06/05/2022   CO2 30 06/05/2022   BUN 17 06/05/2022   CREATININE 0.86 06/05/2022   GLUCOSE 170 (H) 06/05/2022  Results for orders placed or performed during the hospital encounter of 06/04/22  Aerobic/Anaerobic Culture w Gram Stain (surgical/deep wound)     Status: None   Collection Time: 06/04/22  5:41 PM   Specimen: PATH Other; Body Fluid  Result Value Ref Range Status   Specimen Description   Final    SYNOVIAL LEFT KNEE SUPERFICIAL Performed at [facility] [address] [place], [place] [address]    Special Requests   Final    NONE Performed at [facility], [place] [address], [place], [place] [address]    Gram Stain NO WBC SEEN NO ORGANISMS SEEN   Final   Culture   Final    NO GROWTH AEROBICALLY RARE FINEGOLDIA MAGNA Standardized susceptibility testing for this organism is not available. Performed at [facility], [place] [address] [place], [place] [address]    Report Status 06/09/2022 FINAL  Final  MRSA Next Gen by PCR, Nasal     Status: Abnormal   Collection Time: 06/04/22 11:09 PM   Specimen: Nasal Mucosa; Nasal Swab  Result Value Ref Range Status   MRSA by PCR Next Gen DETECTED (A) NOT DETECTED Final    Comment: CRITICAL RESULT CALLED TO, READ BACK BY AND VERIFIED WITH: ESTEBAN,V RN @ [id number] ON [id number] BY MAHMOUD,S (NOTE) The GeneXpert MRSA Assay (FDA approved for NASAL specimens only), is one component of a comprehensive MRSA colonization surveillance program. It is not intended to diagnose MRSA infection nor to guide or monitor treatment for MRSA infections. Test performance is not FDA approved in patients less than 1 years old. Performed at [facility], [place] [address] [place], [place] [address]      Allergies as of 06/06/2022       Reactions   Fosamax [alendronate Sodium] Nausea And Vomiting        Medication List     STOP taking these medications    apixaban 2.5 MG Tabs tablet Commonly known as: Eliquis       TAKE these medications    albuterol 108 (90 Base) MCG/ACT inhaler Commonly known as: VENTOLIN HFA Inhale 2 puffs into the lungs every 6 (six) hours as needed for wheezing or shortness of breath.   Anoro Ellipta 62.5-25 MCG/ACT Aepb Generic drug: umeclidinium-vilanterol Inhale 1 puff into the lungs daily.   BIOFREEZE EX Apply 1 application topically daily as needed (soreness).   bisoprolol-hydrochlorothiazide 5-6.25 MG tablet Commonly known as: ZIAC Take 2 tablets by mouth in the morning.   cholecalciferol 1000 units tablet Commonly known as: VITAMIN D Take 1,000 Units by mouth daily in the afternoon.   doxycycline 100 MG capsule Commonly known as: VIBRAMYCIN Take 1 capsule (100 mg total) by mouth 2 (two) times daily for 14 days.   folic acid 1 MG tablet Commonly known as: FOLVITE Take 1 mg by mouth daily.   lisinopril 40 MG tablet Commonly  known as: ZESTRIL Take 40 mg by mouth daily.   meloxicam 15 MG tablet Commonly known as: MOBIC Take 1 tablet (15 mg total) by mouth daily.   omeprazole 20 MG capsule Commonly known as: PRILOSEC Take 20 mg by mouth daily as needed (acid reflux/heartburn).   ondansetron 4 MG tablet Commonly known as: Zofran Take 1 tablet (4 mg total) by mouth every 8 (eight) hours as needed for nausea or vomiting.       ASK your doctor about these medications    oxyCODONE 5 MG immediate release tablet Commonly  known as: Roxicodone Take 1 tablet (5 mg total) by mouth every 4 (four) hours as needed for up to 7 days for severe pain. Ask about: Should I take this medication?          WEIGHT BEARING   Weight bearing as tolerated with assist device (walker, cane, etc) as directed, use it as long as suggested by your surgeon or therapist, typically at least 4-6 weeks. Knee immobilizer at all times.    EXERCISES  Results after joint replacement surgery are often greatly improved when you follow the exercise, range of motion and muscle strengthening exercises prescribed by your doctor. Safety measures are also important to protect the joint from further injury. Any time any of these exercises cause you to have increased pain or swelling, decrease what you are doing until you are comfortable again and then slowly increase them. If you have problems or questions, call your caregiver or physical therapist for advice.   Rehabilitation is important following a joint replacement. After just a few days of immobilization, the muscles of the leg can become weakened and shrink (atrophy).  These exercises are designed to build up the tone and strength of the thigh and leg muscles and to improve motion. Often times heat used for twenty to thirty minutes before working out will loosen up your tissues and help with improving the range of motion but do not use heat for the first two weeks following surgery (sometimes  heat can increase post-operative swelling).   These exercises can be done on a training (exercise) mat, on the floor, on a table or on a bed. Use whatever works the best and is most comfortable for you.    Use music or television while you are exercising so that the exercises are a pleasant break in your day. This will make your life better with the exercises acting as a break in your routine that you can look forward to.   Perform all exercises about fifteen times, three times per day or as directed.  You should exercise both the operative leg and the other leg as well.  Exercises include:   Quad Sets - Tighten up the muscle on the front of the thigh (Quad) and hold for 5-10 seconds.   Straight Leg Raises - With your knee straight (if you were given a brace, keep it on), lift the leg to 60 degrees, hold for 3 seconds, and slowly lower the leg.  Perform this exercise against resistance later as your leg gets stronger.  Leg Slides: Lying on your back, slowly slide your foot toward your buttocks, bending your knee up off the floor (only go as far as is comfortable). Then slowly slide your foot back down until your leg is flat on the floor again.  Angel Wings: Lying on your back spread your legs to the side as far apart as you can without causing discomfort.  Hamstring Strength:  Lying on your back, push your heel against the floor with your leg straight by tightening up the muscles of your buttocks.  Repeat, but this time bend your knee to a comfortable angle, and push your heel against the floor.  You may put a pillow under the heel to make it more comfortable if necessary.   A rehabilitation program following joint replacement surgery can speed recovery and prevent re-injury in the future due to weakened muscles. Contact your doctor or a physical therapist for more information on knee rehabilitation.    CONSTIPATION  Constipation  is defined medically as fewer than three stools per week and severe  constipation as less than one stool per week.  Even if you have a regular bowel pattern at home, your normal regimen is likely to be disrupted due to multiple reasons following surgery.  Combination of anesthesia, postoperative narcotics, change in appetite and fluid intake all can affect your bowels.   YOU MUST use at least one of the following options; they are listed in order of increasing strength to get the job done.  They are all available over the counter, and you may need to use some, POSSIBLY even all of these options:    Drink plenty of fluids (prune juice may be helpful) and high fiber foods Colace 100 mg by mouth twice a day  Senokot for constipation as directed and as needed Dulcolax (bisacodyl), take with full glass of water  Miralax (polyethylene glycol) once or twice a day as needed.  If you have tried all these things and are unable to have a bowel movement in the first 3-4 days after surgery call either your surgeon or your primary doctor.    If you experience loose stools or diarrhea, hold the medications until you stool forms back up.  If your symptoms do not get better within 1 week or if they get worse, check with your doctor.  If you experience "the worst abdominal pain ever" or develop nausea or vomiting, please contact the office immediately for further recommendations for treatment.   ITCHING:  If you experience itching with your medications, try taking only a single pain pill, or even half a pain pill at a time.  You can also use Benadryl over the counter for itching or also to help with sleep.   TED HOSE STOCKINGS:  Use stockings on both legs until for at least 2 weeks or as directed by physician office. They may be removed at night for sleeping.  MEDICATIONS:  See your medication summary on the "After Visit Summary" that nursing will review with you.  You may have some home medications which will be placed on hold until you complete the course of blood thinner  medication.  It is important for you to complete the blood thinner medication as prescribed.  PRECAUTIONS:  If you experience chest pain or shortness of breath - call 911 immediately for transfer to the hospital emergency department.   If you develop a fever greater that 101 F, purulent drainage from wound, increased redness or drainage from wound, foul odor from the wound/dressing, or calf pain - CONTACT YOUR SURGEON.                                                   FOLLOW-UP APPOINTMENTS:  If you do not already have a post-op appointment, please call the office for an appointment to be seen by your surgeon.  Guidelines for how soon to be seen are listed in your "After Visit Summary", but are typically between 1-4 weeks after surgery.  OTHER INSTRUCTIONS:   Knee Replacement:  Do not place pillow under knee, focus on keeping the knee straight while resting. CPM instructions: 0-90 degrees, 2 hours in the morning, 2 hours in the afternoon, and 2 hours in the evening. Place foam block, curve side up under heel at all times except when in CPM or when walking.  DO NOT modify, tear, cut, or change the foam block in any way.   MAKE SURE YOU:  Understand these instructions.  Get help right away if you are not doing well or get worse.    Thank you for letting us be a part of your medical care team.  It is a privilege we respect greatly.  We hope these instructions will help you stay on track for a fast and full recovery!   Diagnostic Studies: No results found.  Disposition: Discharge disposition: 01-Home or Self Care       Discharge Instructions     Call MD / Call 911   Complete by: As directed    If you experience chest pain or shortness of breath, CALL 911 and be transported to the hospital emergency room.  If you develope a fever above 101 F, pus (white drainage) or increased drainage or redness at the wound, or calf pain, call your surgeon's office.   Care order/instruction   Complete  by: As directed    Patient needs to be switched to home wound vac (Prevena) prior to discharge   Constipation Prevention   Complete by: As directed    Drink plenty of fluids.  Prune juice may be helpful.  You may use a stool softener, such as Colace (over the counter) 100 mg twice a day.  Use MiraLax (over the counter) for constipation as needed.   Diet - low sodium heart healthy   Complete by: As directed    Discharge instructions   Complete by: As directed    Remain in knee immobilizer at all times Okay for WBAT Keep VAC unit attached   Increase activity slowly as tolerated   Complete by: As directed    Post-operative opioid taper instructions:   Complete by: As directed    POST-OPERATIVE OPIOID TAPER INSTRUCTIONS: It is important to wean off of your opioid medication as soon as possible. If you do not need pain medication after your surgery it is ok to stop day one. Opioids include: Codeine, Hydrocodone(Norco, Vicodin), Oxycodone(Percocet, oxycontin) and hydromorphone amongst others.  Long term and even short term use of opiods can cause: Increased pain response Dependence Constipation Depression Respiratory depression And more.  Withdrawal symptoms can include Flu like symptoms Nausea, vomiting And more Techniques to manage these symptoms Hydrate well Eat regular healthy meals Stay active Use relaxation techniques(deep breathing, meditating, yoga) Do Not substitute Alcohol to help with tapering If you have been on opioids for less than two weeks and do not have pain than it is ok to stop all together.  Plan to wean off of opioids This plan should start within one week post op of your joint replacement. Maintain the same interval or time between taking each dose and first decrease the dose.  Cut the total daily intake of opioids by one tablet each day Next start to increase the time between doses. The last dose that should be eliminated is the evening dose.            Follow-up Information     Rod Can, MD Follow up on 06/09/2022.   Specialty: Orthopedic Surgery Why: Follow up on Tuesday 06/09/22 with Larene Pickett PA-C for removal of Prevena negative pressure dressing., For wound re-check Contact information: [address] [place] [address] [phone]                  Signed: Charlott Rakes, PA-C 06/16/2022, 2:01 PM

## 2022-06-30 DIAGNOSIS — M25511 Pain in right shoulder: Secondary | ICD-10-CM | POA: Diagnosis not present

## 2022-06-30 DIAGNOSIS — M19011 Primary osteoarthritis, right shoulder: Secondary | ICD-10-CM | POA: Diagnosis not present

## 2022-06-30 DIAGNOSIS — Z96612 Presence of left artificial shoulder joint: Secondary | ICD-10-CM | POA: Diagnosis not present

## 2022-07-07 ENCOUNTER — Other Ambulatory Visit: Payer: Self-pay | Admitting: Orthopaedic Surgery

## 2022-07-09 DIAGNOSIS — M25662 Stiffness of left knee, not elsewhere classified: Secondary | ICD-10-CM | POA: Diagnosis not present

## 2022-07-09 DIAGNOSIS — M25562 Pain in left knee: Secondary | ICD-10-CM | POA: Diagnosis not present

## 2022-07-14 DIAGNOSIS — M25662 Stiffness of left knee, not elsewhere classified: Secondary | ICD-10-CM | POA: Diagnosis not present

## 2022-07-14 DIAGNOSIS — M25562 Pain in left knee: Secondary | ICD-10-CM | POA: Diagnosis not present

## 2022-07-16 DIAGNOSIS — M25662 Stiffness of left knee, not elsewhere classified: Secondary | ICD-10-CM | POA: Diagnosis not present

## 2022-07-16 DIAGNOSIS — M25562 Pain in left knee: Secondary | ICD-10-CM | POA: Diagnosis not present

## 2022-07-30 DIAGNOSIS — M25662 Stiffness of left knee, not elsewhere classified: Secondary | ICD-10-CM | POA: Diagnosis not present

## 2022-07-30 DIAGNOSIS — M25562 Pain in left knee: Secondary | ICD-10-CM | POA: Diagnosis not present

## 2022-07-30 DIAGNOSIS — Z96612 Presence of left artificial shoulder joint: Secondary | ICD-10-CM | POA: Diagnosis not present

## 2022-07-30 DIAGNOSIS — M19011 Primary osteoarthritis, right shoulder: Secondary | ICD-10-CM | POA: Diagnosis not present

## 2022-08-03 DIAGNOSIS — M25662 Stiffness of left knee, not elsewhere classified: Secondary | ICD-10-CM | POA: Diagnosis not present

## 2022-08-03 DIAGNOSIS — M25562 Pain in left knee: Secondary | ICD-10-CM | POA: Diagnosis not present

## 2022-08-06 DIAGNOSIS — M25562 Pain in left knee: Secondary | ICD-10-CM | POA: Diagnosis not present

## 2022-08-06 DIAGNOSIS — M25662 Stiffness of left knee, not elsewhere classified: Secondary | ICD-10-CM | POA: Diagnosis not present

## 2022-08-10 DIAGNOSIS — M25552 Pain in left hip: Secondary | ICD-10-CM | POA: Diagnosis not present

## 2022-08-11 DIAGNOSIS — M25562 Pain in left knee: Secondary | ICD-10-CM | POA: Diagnosis not present

## 2022-08-11 DIAGNOSIS — M25662 Stiffness of left knee, not elsewhere classified: Secondary | ICD-10-CM | POA: Diagnosis not present

## 2022-08-13 DIAGNOSIS — M25512 Pain in left shoulder: Secondary | ICD-10-CM | POA: Diagnosis not present

## 2022-08-17 ENCOUNTER — Ambulatory Visit (INDEPENDENT_AMBULATORY_CARE_PROVIDER_SITE_OTHER): Payer: No Typology Code available for payment source | Admitting: Primary Care

## 2022-08-17 ENCOUNTER — Encounter: Payer: Self-pay | Admitting: Primary Care

## 2022-08-17 VITALS — BP 140/90 | HR 94 | Temp 98.5°F | Ht 63.0 in | Wt 147.8 lb

## 2022-08-17 DIAGNOSIS — C3491 Malignant neoplasm of unspecified part of right bronchus or lung: Secondary | ICD-10-CM | POA: Diagnosis not present

## 2022-08-17 DIAGNOSIS — M069 Rheumatoid arthritis, unspecified: Secondary | ICD-10-CM | POA: Diagnosis not present

## 2022-08-17 DIAGNOSIS — J449 Chronic obstructive pulmonary disease, unspecified: Secondary | ICD-10-CM | POA: Diagnosis not present

## 2022-08-17 NOTE — Progress Notes (Signed)
@Patient  ID: Heather Ellis, female    DOB: Jan 01, 1946, 77 y.o.   MRN: [id number]  Chief Complaint  Patient presents with   Follow-up    Pain in right chest area x 2 months.    Referring provider: [name], MD  HPI: 76 year old female, former smoked. PMH significant for asthma with COPD and adenocarcinoma right lung. Patient of Dr. Lamonte Sakai.   Previous [facility] pulmonary encounter: 09/18/2021 Patient presents today for surgical clearance. Planning for left rotator cuff surgery with Dr. Stann Mainland on February 24th. She has hx asthma/COPD and adenocarcinoma right lung. She has some mild exertional dyspnea and a dry cough at baseline. No mucus production. She is not regularly using her Stiolto inhaler. PFTs in August 2022 showed very severe airflow obstruction/ FEV1 36%. She finished radiation in December 2022. Following with Dr. Julien Nordmann with oncology.   03/16/2022 Patient presents today for surgical risk assessment. Medical history significant for lung cancer, asthma with COPD and RA on methotrexate (no longer on prednisone). Hx end-stage osteoarthritis left knee. Planning for left knee replacement with Dr. Rod Can with [facility]. Plan overnight stay at [facility] long. Post-op apixcabn for DVT prophylaxis.  She had left rotator cuff shoulder surgery in February 2023 and did well with this. She is still doing therapy at home.  Overall she is doing well without acute respiratory complaints. She has chronic exertional dyspnea which is worse in heat/humidity. She has a rare dry cough. Uses Stiolto only as needed. She has some confusion over her inhalers. We reviewed proper use today. PFTs in August 2022 showed FEV 36%.   08/17/2022- Interim hx  Patient presents today for 3-4 month follow-up.   Patient's breathing has been pretty good as of late.  Whether/cold air causes wheezing symptoms.  Other than that she has been doing well.  She is using Anoro 1 puff every other day with reported  improvement.  She rarely uses albuterol rescue inhaler.  She does not have a consistent cough.  She does have some right-sided pleuritic pain over the last several months which is not constant.  Only occurs when laying down.  This is likely due to radiation fibrosis to right upper lobe of her lung after SBRT.  Follows with Dr. Earlie Server for lung cancer diagnosis.  No evidence of disease recurrence on most recent CT imaging.  She has a follow-up with him scheduled in February.  Plan is to continue CT chest every 6 months for surveillance.  She is no longer on methotrexate, following with Dr. Kathlene November and last seen in November. She has had left shoulder surgery and left total knee replacement this past year.    Allergies  Allergen Reactions   Fosamax [Alendronate Sodium] Nausea And Vomiting    Immunization History  Administered Date(s) Administered   Influenza, High Dose Seasonal PF 08/06/2016, 05/22/2017, 05/18/2018   Influenza, Quadrivalent, Recombinant, Inj, Pf 06/18/2021   Moderna Sars-Covid-2 Vaccination 09/13/2019, 10/11/2019, 12/04/2020   Pneumococcal Conjugate-13 08/16/2019   Tdap 12/19/2010    Past Medical History:  Diagnosis Date   Acid reflux    Anemia    Asthma    COPD (chronic obstructive pulmonary disease) (HCC)    Dyspnea    WITH EXERTION   H/O polymyalgia rheumatica    History of COVID-19 2020   Hypertension    Non-small cell lung cancer (HCC)    Right   OA (osteoarthritis)    Obesity    Osteoporosis    Pre-diabetes  Syncope    Syncope and collapse 08/2018   Vitamin D deficiency     Tobacco History: Social History   Tobacco Use  Smoking Status Former   Types: Cigarettes   Quit date: 10/19/2014   Years since quitting: 7.8  Smokeless Tobacco Never   Counseling given: Not Answered   Outpatient Medications Prior to Visit  Medication Sig Dispense Refill   albuterol (VENTOLIN HFA) 108 (90 Base) MCG/ACT inhaler Inhale 2 puffs into the lungs every 6 (six)  hours as needed for wheezing or shortness of breath.     bisoprolol-hydrochlorothiazide (ZIAC) 5-6.25 MG tablet Take 2 tablets by mouth in the morning.     cholecalciferol (VITAMIN D) 1000 UNITS tablet Take 1,000 Units by mouth daily in the afternoon.     folic acid (FOLVITE) 1 MG tablet Take 1 mg by mouth daily.     lisinopril (ZESTRIL) 40 MG tablet Take 40 mg by mouth daily.     meloxicam (MOBIC) 15 MG tablet Take 1 tablet (15 mg total) by mouth daily. 30 tablet 2   Menthol, Topical Analgesic, (BIOFREEZE EX) Apply 1 application topically daily as needed (soreness).     omeprazole (PRILOSEC) 20 MG capsule Take 20 mg by mouth daily as needed (acid reflux/heartburn).     ondansetron (ZOFRAN) 4 MG tablet Take 1 tablet (4 mg total) by mouth every 8 (eight) hours as needed for nausea or vomiting. 30 tablet 0   umeclidinium-vilanterol (ANORO ELLIPTA) 62.5-25 MCG/ACT AEPB Inhale 1 puff into the lungs daily. 60 each 3   No facility-administered medications prior to visit.    Review of Systems  Review of Systems  Constitutional: Negative.   HENT: Negative.    Respiratory:  Negative for cough, chest tightness, shortness of breath and wheezing.   Cardiovascular: Negative.     Physical Exam  BP (!) 140/90 (BP Location: Right Arm, Patient Position: Sitting, Cuff Size: Normal)   Pulse 94   Temp 98.5 F (36.9 C) (Oral)   Ht 5\' 3"  (1.6 m)   Wt 147 lb 12.8 oz (67 kg)   SpO2 97%   BMI 26.18 kg/m  Physical Exam Constitutional:      Appearance: Normal appearance.  HENT:     Head: Normocephalic and atraumatic.     Mouth/Throat:     Mouth: Mucous membranes are moist.     Pharynx: Oropharynx is clear.  Cardiovascular:     Rate and Rhythm: Normal rate and regular rhythm.  Pulmonary:     Effort: Pulmonary effort is normal.     Breath sounds: Normal breath sounds.  Skin:    General: Skin is warm and dry.  Neurological:     General: No focal deficit present.     Mental Status: She is alert  and oriented to person, place, and time. Mental status is at baseline.  Psychiatric:        Mood and Affect: Mood normal.        Behavior: Behavior normal.        Thought Content: Thought content normal.        Judgment: Judgment normal.      Lab Results:  CBC    Component Value Date/Time   WBC 4.8 06/06/2022 0921   RBC 3.12 (L) 06/06/2022 0921   HGB 9.0 (L) 06/06/2022 0921   HGB 10.7 (L) 03/26/2022 1127   HCT 29.8 (L) 06/06/2022 0921   PLT 174 06/06/2022 0921   PLT 197 03/26/2022 1127   MCV 95.5 06/06/2022  0921   MCH 28.8 06/06/2022 0921   MCHC 30.2 06/06/2022 0921   RDW 17.0 (H) 06/06/2022 0921   LYMPHSABS 1.1 03/26/2022 1127   MONOABS 0.4 03/26/2022 1127   EOSABS 0.1 03/26/2022 1127   BASOSABS 0.1 03/26/2022 1127    BMET    Component Value Date/Time   NA 142 06/05/2022 0331   NA 146 (H) 10/26/2018 0816   K 4.2 06/05/2022 0331   CL 103 06/05/2022 0331   CO2 30 06/05/2022 0331   GLUCOSE 170 (H) 06/05/2022 0331   BUN 17 06/05/2022 0331   BUN 17 10/26/2018 0816   CREATININE 0.86 06/05/2022 0331   CREATININE 0.65 03/26/2022 1127   CALCIUM 9.7 06/05/2022 0331   GFRNONAA >60 06/05/2022 0331   GFRNONAA >60 03/26/2022 1127   GFRAA >60 01/05/2020 1259    BNP No results found for: "BNP"  ProBNP No results found for: "PROBNP"  Imaging: No results found.   Assessment & Plan:   COPD, very severe ([place]) - Symptoms are currently well controlled on Anoro Ellipta. No recent exacerbations or hospitalizations. Rare SABA use. Weather exacerbates her wheezing symptoms. No persistent cough.   Adenocarcinoma of right lung, stage 1 (HCC) - S/p SBRT - Currently under surveillance with routine CT chest q 6 months - No evidence of disease recurrence   - Follow scheduled for February with Dr. Julien Nordmann   Rheumatoid arthritis [facility]) - No longer on methotrexate - Following with Dr. Kathlene November, seen in November- note is not available for download in care everywhere     Martyn Ehrich, NP 08/17/2022

## 2022-08-17 NOTE — Assessment & Plan Note (Signed)
-   Symptoms are currently well controlled on Anoro Ellipta. No recent exacerbations or hospitalizations. Rare SABA use. Weather exacerbates her wheezing symptoms. No persistent cough.

## 2022-08-17 NOTE — Assessment & Plan Note (Signed)
-   No longer on methotrexate - Following with Dr. Kathlene November, seen in November- note is not available for download in care everywhere

## 2022-08-17 NOTE — Patient Instructions (Addendum)
COPD/asthma - Continue Anoro Ellipta- take 1 puff daily in the morning - Use Albuterol rescue inhaler every 4-6 hours as needed only for breakthrough symptoms of shortness of breath/wheezing   Lung cancer: - No evidence of disease reoccurrence - CT chest every 6 months for surveillance  - Follow-up with Dr. Earlie Server in February 5th   Follow-up: - 6 months with Dr. Lamonte Sakai or sooner if needed

## 2022-08-17 NOTE — Assessment & Plan Note (Signed)
-   S/p SBRT - Currently under surveillance with routine CT chest q 6 months - No evidence of disease recurrence   - Follow scheduled for February with Dr. Julien Nordmann

## 2022-08-27 DIAGNOSIS — M19011 Primary osteoarthritis, right shoulder: Secondary | ICD-10-CM | POA: Diagnosis not present

## 2022-08-27 DIAGNOSIS — Z96612 Presence of left artificial shoulder joint: Secondary | ICD-10-CM | POA: Diagnosis not present

## 2022-08-27 DIAGNOSIS — M25512 Pain in left shoulder: Secondary | ICD-10-CM | POA: Diagnosis not present

## 2022-09-16 DIAGNOSIS — M810 Age-related osteoporosis without current pathological fracture: Secondary | ICD-10-CM | POA: Diagnosis not present

## 2022-10-01 ENCOUNTER — Other Ambulatory Visit: Payer: Self-pay | Admitting: Registered Nurse

## 2022-10-01 ENCOUNTER — Telehealth: Payer: Self-pay | Admitting: Internal Medicine

## 2022-10-01 DIAGNOSIS — Z1231 Encounter for screening mammogram for malignant neoplasm of breast: Secondary | ICD-10-CM

## 2022-10-01 NOTE — Telephone Encounter (Signed)
Called patient regarding February appointments, patient is notified.

## 2022-10-02 ENCOUNTER — Ambulatory Visit (HOSPITAL_COMMUNITY)
Admission: RE | Admit: 2022-10-02 | Discharge: 2022-10-02 | Disposition: A | Discharge status: Home / Self Care | Payer: No Typology Code available for payment source | Source: Ambulatory Visit | Attending: Internal Medicine | Admitting: Internal Medicine

## 2022-10-02 ENCOUNTER — Inpatient Hospital Stay: Payer: No Typology Code available for payment source | Attending: Internal Medicine

## 2022-10-02 ENCOUNTER — Other Ambulatory Visit: Payer: Self-pay

## 2022-10-02 DIAGNOSIS — Z923 Personal history of irradiation: Secondary | ICD-10-CM | POA: Insufficient documentation

## 2022-10-02 DIAGNOSIS — C3490 Malignant neoplasm of unspecified part of unspecified bronchus or lung: Secondary | ICD-10-CM

## 2022-10-02 DIAGNOSIS — Z79899 Other long term (current) drug therapy: Secondary | ICD-10-CM | POA: Diagnosis not present

## 2022-10-02 DIAGNOSIS — R0602 Shortness of breath: Secondary | ICD-10-CM | POA: Diagnosis not present

## 2022-10-02 DIAGNOSIS — C3411 Malignant neoplasm of upper lobe, right bronchus or lung: Secondary | ICD-10-CM | POA: Insufficient documentation

## 2022-10-02 DIAGNOSIS — I10 Essential (primary) hypertension: Secondary | ICD-10-CM | POA: Diagnosis not present

## 2022-10-02 LAB — CBC WITH DIFFERENTIAL (CANCER CENTER ONLY)
Abs Immature Granulocytes: 0.01 10*3/uL (ref 0.00–0.07)
Basophils Absolute: 0 10*3/uL (ref 0.0–0.1)
Basophils Relative: 1 %
Eosinophils Absolute: 0.1 10*3/uL (ref 0.0–0.5)
Eosinophils Relative: 1 %
HCT: 31.9 % — ABNORMAL LOW (ref 36.0–46.0)
Hemoglobin: 10.3 g/dL — ABNORMAL LOW (ref 12.0–15.0)
Immature Granulocytes: 0 %
Lymphocytes Relative: 44 %
Lymphs Abs: 1.7 10*3/uL (ref 0.7–4.0)
MCH: 28.8 pg (ref 26.0–34.0)
MCHC: 32.3 g/dL (ref 30.0–36.0)
MCV: 89.1 fL (ref 80.0–100.0)
Monocytes Absolute: 0.3 10*3/uL (ref 0.1–1.0)
Monocytes Relative: 8 %
Neutro Abs: 1.9 10*3/uL (ref 1.7–7.7)
Neutrophils Relative %: 46 %
Platelet Count: 213 10*3/uL (ref 150–400)
RBC: 3.58 MIL/uL — ABNORMAL LOW (ref 3.87–5.11)
RDW: 15.6 % — ABNORMAL HIGH (ref 11.5–15.5)
WBC Count: 4 10*3/uL (ref 4.0–10.5)
nRBC: 0 % (ref 0.0–0.2)

## 2022-10-02 LAB — CMP (CANCER CENTER ONLY)
ALT: 7 U/L (ref 0–44)
AST: 14 U/L — ABNORMAL LOW (ref 15–41)
Albumin: 3.7 g/dL (ref 3.5–5.0)
Alkaline Phosphatase: 94 U/L (ref 38–126)
Anion gap: 5 (ref 5–15)
BUN: 10 mg/dL (ref 8–23)
CO2: 32 mmol/L (ref 22–32)
Calcium: 9.3 mg/dL (ref 8.9–10.3)
Chloride: 105 mmol/L (ref 98–111)
Creatinine: 0.74 mg/dL (ref 0.44–1.00)
GFR, Estimated: >60 mL/min (ref >60)
Glucose, Bld: 89 mg/dL (ref 70–99)
Potassium: 3.7 mmol/L (ref 3.5–5.1)
Sodium: 142 mmol/L (ref 135–145)
Total Bilirubin: 0.5 mg/dL (ref 0.3–1.2)
Total Protein: 6.7 g/dL (ref 6.5–8.1)

## 2022-10-02 MED ORDER — IOHEXOL 300 MG/ML  SOLN
75.0000 mL | Freq: Once | INTRAMUSCULAR | Status: AC | PRN
  Administered 2022-10-02: 75 mL via INTRAVENOUS

## 2022-10-05 ENCOUNTER — Inpatient Hospital Stay (HOSPITAL_BASED_OUTPATIENT_CLINIC_OR_DEPARTMENT_OTHER): Payer: No Typology Code available for payment source | Admitting: Internal Medicine

## 2022-10-05 ENCOUNTER — Other Ambulatory Visit: Payer: Self-pay

## 2022-10-05 VITALS — BP 184/108 | HR 94 | Temp 98.1°F | Resp 20 | Wt 152.8 lb

## 2022-10-05 DIAGNOSIS — C3490 Malignant neoplasm of unspecified part of unspecified bronchus or lung: Secondary | ICD-10-CM | POA: Diagnosis not present

## 2022-10-05 DIAGNOSIS — C3411 Malignant neoplasm of upper lobe, right bronchus or lung: Secondary | ICD-10-CM | POA: Diagnosis not present

## 2022-10-05 NOTE — Progress Notes (Signed)
[place] [phone]   [phone]  OFFICE PROGRESS NOTE  Pcp, No No address on file  DIAGNOSIS: Stage IA (T1b, N0, M0) non-small cell lung cancer, adenocarcinoma presented with right upper lobe lung nodule diagnosed in September 2022.  PRIOR THERAPY: Status post SBRT to the right upper lobe lung nodule under the care of Dr. Lisbeth Renshaw completed July 09, 2021.  CURRENT THERAPY: Observation.  INTERVAL HISTORY: Heather Ellis 77 y.o. female returns to the clinic today for 6 months follow-up visit.  The patient is feeling fine today with no concerning complaints except for the baseline shortness of breath increased with exertion.  Her blood pressure was high today but she was not taking her medication as prescribed.  The patient denied having any current chest pain, cough or hemoptysis.  She has no nausea, vomiting, diarrhea or constipation.  She has no headache or visual changes.  She denied having any recent weight loss or night sweats.  She is here today for evaluation with repeat CT scan of the chest for restaging of her disease.  MEDICAL HISTORY: Past Medical History:  Diagnosis Date   Acid reflux    Anemia    Asthma    COPD (chronic obstructive pulmonary disease) (HCC)    Dyspnea    WITH EXERTION   H/O polymyalgia rheumatica    History of COVID-19 2020   Hypertension    Non-small cell lung cancer ([place])    Right   OA (osteoarthritis)    Obesity    Osteoporosis    Pre-diabetes    Syncope    Syncope and collapse 08/2018   Vitamin D deficiency     ALLERGIES:  is allergic to fosamax [alendronate sodium].  MEDICATIONS:  Current Outpatient Medications  Medication Sig Dispense Refill   albuterol (VENTOLIN HFA) 108 (90 Base) MCG/ACT inhaler Inhale 2 puffs into the lungs every 6 (six) hours as needed for wheezing or shortness of breath.     bisoprolol-hydrochlorothiazide (ZIAC) 5-6.25 MG tablet Take 2 tablets by mouth in the morning.      cholecalciferol (VITAMIN D) 1000 UNITS tablet Take 1,000 Units by mouth daily in the afternoon.     folic acid (FOLVITE) 1 MG tablet Take 1 mg by mouth daily.     lisinopril (ZESTRIL) 40 MG tablet Take 40 mg by mouth daily.     meloxicam (MOBIC) 15 MG tablet Take 1 tablet (15 mg total) by mouth daily. 30 tablet 2   Menthol, Topical Analgesic, (BIOFREEZE EX) Apply 1 application topically daily as needed (soreness).     omeprazole (PRILOSEC) 20 MG capsule Take 20 mg by mouth daily as needed (acid reflux/heartburn).     ondansetron (ZOFRAN) 4 MG tablet Take 1 tablet (4 mg total) by mouth every 8 (eight) hours as needed for nausea or vomiting. 30 tablet 0   umeclidinium-vilanterol (ANORO ELLIPTA) 62.5-25 MCG/ACT AEPB Inhale 1 puff into the lungs daily. 60 each 3   No current facility-administered medications for this visit.    SURGICAL HISTORY:  Past Surgical History:  Procedure Laterality Date   ABDOMINAL HYSTERECTOMY     TOTAL ABDOMINAL HYSTERECTOMY    BRONCHIAL BIOPSY  05/12/2021   Procedure: BRONCHIAL BIOPSIES;  Surgeon: Collene Gobble, MD;  Location: [facility] ENDOSCOPY;  Service: Pulmonary;;   BRONCHIAL BRUSHINGS  05/12/2021   Procedure: BRONCHIAL BRUSHINGS;  Surgeon: Collene Gobble, MD;  Location[facility] ENDOSCOPY;  Service: Pulmonary;;   BRONCHIAL NEEDLE ASPIRATION BIOPSY  05/12/2021  Procedure: BRONCHIAL NEEDLE ASPIRATION BIOPSIES;  Surgeon: Collene Gobble, MD;  Location: [facility] ENDOSCOPY;  Service: Pulmonary;;   COLONOSCOPY W/ BIOPSIES  2015   ESOPHAGOGASTRODUODENOSCOPY     FIDUCIAL MARKER PLACEMENT  05/12/2021   Procedure: FIDUCIAL MARKER PLACEMENT;  Surgeon: Collene Gobble, MD;  Location: [facility] ENDOSCOPY;  Service: Pulmonary;;   INCISION AND DRAINAGE Left 06/04/2022   Procedure: INCISION AND DRAINAGE;  Surgeon: Rod Can, MD;  Location: [facility] ORS;  Service: Orthopedics;  Laterality: Left;   KNEE ARTHROPLASTY Left 04/22/2022   Procedure: COMPUTER ASSISTED TOTAL KNEE ARTHROPLASTY;  Surgeon:  Rod Can, MD;  Location: [facility];  Service: Orthopedics;  Laterality: Left;   OOPHORECTOMY     BSO   REVERSE SHOULDER ARTHROPLASTY Left 10/24/2021   Procedure: REVERSE SHOULDER ARTHROPLASTY;  Surgeon: Nicholes Stairs, MD;  Location: [facility];  Service: Orthopedics;  Laterality: Left;   TOTAL HIP ARTHROPLASTY Left 01/16/2020   Procedure: TOTAL HIP ARTHROPLASTY ANTERIOR APPROACH;  Surgeon: Renette Butters, MD;  Location: [facility];  Service: Orthopedics;  Laterality: Left;   VIDEO BRONCHOSCOPY WITH ENDOBRONCHIAL NAVIGATION Right 05/12/2021   Procedure: ROBOTIC ASSISTED BRONCHOSCOPY WITH ENDOBRONCHIAL NAVIGATION;  Surgeon: Collene Gobble, MD;  Location: [facility] ENDOSCOPY;  Service: Pulmonary;  Laterality: Right;   VIDEO BRONCHOSCOPY WITH RADIAL ENDOBRONCHIAL ULTRASOUND  05/12/2021   Procedure: VIDEO BRONCHOSCOPY WITH RADIAL ENDOBRONCHIAL ULTRASOUND;  Surgeon: Collene Gobble, MD;  Location: [facility] ENDOSCOPY;  Service: Pulmonary;;    REVIEW OF SYSTEMS:  A comprehensive review of systems was negative except for: Constitutional: positive for fatigue Respiratory: positive for dyspnea on exertion   PHYSICAL EXAMINATION: General appearance: alert, cooperative, and no distress Head: Normocephalic, without obvious abnormality, atraumatic Neck: no adenopathy, no JVD, supple, symmetrical, trachea midline, and thyroid not enlarged, symmetric, no tenderness/mass/nodules Lymph nodes: Cervical, supraclavicular, and axillary nodes normal. Resp: clear to auscultation bilaterally Back: symmetric, no curvature. ROM normal. No CVA tenderness. Cardio: regular rate and rhythm, S1, S2 normal, no murmur, click, rub or gallop GI: soft, non-tender; bowel sounds normal; no masses,  no organomegaly Extremities: extremities normal, atraumatic, no cyanosis or edema  ECOG PERFORMANCE STATUS: 1 - Symptomatic but completely ambulatory  Blood pressure (!) 184/108, pulse 94, temperature 98.1 F (36.7 C), temperature source  Oral, resp. rate 20, weight 152 lb 12.8 oz (69.3 kg), SpO2 98 %.  LABORATORY DATA: Lab Results  Component Value Date   WBC 4.0 10/02/2022   HGB 10.3 (L) 10/02/2022   HCT 31.9 (L) 10/02/2022   MCV 89.1 10/02/2022   PLT 213 10/02/2022      Chemistry      Component Value Date/Time   NA 142 10/02/2022 0750   NA 146 (H) 10/26/2018 0816   K 3.7 10/02/2022 0750   CL 105 10/02/2022 0750   CO2 32 10/02/2022 0750   BUN 10 10/02/2022 0750   BUN 17 10/26/2018 0816   CREATININE 0.74 10/02/2022 0750      Component Value Date/Time   CALCIUM 9.3 10/02/2022 0750   ALKPHOS 94 10/02/2022 0750   AST 14 (L) 10/02/2022 0750   ALT 7 10/02/2022 0750   BILITOT 0.5 10/02/2022 0750       RADIOGRAPHIC STUDIES: CT Chest W Contrast  Result Date: 10/02/2022 CLINICAL DATA:  Non-small cell lung cancer, staging. * Tracking Code: BO * EXAM: CT CHEST WITH CONTRAST TECHNIQUE: Multidetector CT imaging of the chest was performed during intravenous contrast administration. RADIATION DOSE REDUCTION: This exam was performed according to the departmental dose-optimization program which  includes automated exposure control, adjustment of the mA and/or kV according to patient size and/or use of iterative reconstruction technique. CONTRAST:  21mL OMNIPAQUE IOHEXOL 300 MG/ML  SOLN COMPARISON:  Chest CT 03/26/2022 and 09/22/2021.  PET-CT 04/17/2021. FINDINGS: Cardiovascular: No acute vascular findings. Mild aortic and coronary artery atherosclerosis. Stable mild dilatation of the ascending aorta which measures less than 4 cm in maximal diameter when measured perpendicular to its long axis. The heart size is normal. There is no pericardial effusion. Mediastinum/Nodes: There are no enlarged mediastinal, hilar or axillary lymph nodes.Stable 9 mm left thyroid nodule on image 10/2; no follow-up imaging recommended. Stable small hiatal hernia with mild distal esophageal wall thickening. Lungs/Pleura: No pleural effusion or  pneumothorax. Stable treatment changes in the right suprahilar region surrounding the fiducial marker with bandlike fibrosis tracking along both fissures, stability best addressed on the sagittal images. No evidence of local recurrence. Scattered small solid and ground-glass pulmonary nodules bilaterally are unchanged, measuring up to 6 mm in the right lower lobe on image 72/7. No new or enlarging nodules. Upper abdomen: The visualized upper abdomen appears stable without suspicious findings. Small low-density splenic lesions are unchanged. Musculoskeletal/Chest wall: No chest wall mass or osseous metastases are identified. There are new fractures of the right 5th and 6th ribs laterally which are likely insufficiency fractures related to prior radiation therapy. Old fractures of the right 7th and 8th ribs are unchanged. Previous left shoulder reverse arthroplasty. IMPRESSION: 1. Stable treatment changes in the right suprahilar region without evidence of local recurrence or metastatic disease. 2. Stable small solid and ground-glass pulmonary nodules bilaterally. Attention on continued surveillance recommended. 3. New fractures of the right 5th and 6th ribs laterally, likely insufficiency fractures related to prior radiation therapy. 4. Stable small hiatal hernia and mild distal esophageal wall thickening. 5.  Aortic Atherosclerosis (ICD10-I70.0). Electronically Signed   By: Richardean Sale M.D.   On: 10/02/2022 14:28    ASSESSMENT AND PLAN: This is a very pleasant 77 years old African-American female diagnosed with a stage IA (T1b, N0, M0) non-small cell lung cancer, adenocarcinoma in September 2022 The patient is status post SBRT to the right upper lobe lung nodule completed in November 2022. Patient is currently on observation and she is feeling fine with no concerning complaints except for the baseline fatigue and shortness of breath increased with exertion. She had repeat CT scan of the chest performed  recently.  I personally and independently reviewed the scan and discussed the results with the patient today. Her scan showed no concerning findings for disease recurrence or metastasis. I recommended for her to continue on observation with repeat CT scan of the chest in 6 months. The patient was advised to take her blood pressure medication as prescribed and to monitor it closely at home. She was also advised to call immediately if she has any other concerning symptoms in the interval.  The patient voices understanding of current disease status and treatment options and is in agreement with the current care plan.  All questions were answered. The patient knows to call the clinic with any problems, questions or concerns. We can certainly see the patient much sooner if necessary.  The total time spent in the appointment was 20 minutes.  Disclaimer: This note was dictated with voice recognition software. Similar sounding words can inadvertently be transcribed and may not be corrected upon review.

## 2022-10-29 DIAGNOSIS — M1990 Unspecified osteoarthritis, unspecified site: Secondary | ICD-10-CM | POA: Diagnosis not present

## 2022-10-29 DIAGNOSIS — J449 Chronic obstructive pulmonary disease, unspecified: Secondary | ICD-10-CM | POA: Diagnosis not present

## 2022-10-29 DIAGNOSIS — I10 Essential (primary) hypertension: Secondary | ICD-10-CM | POA: Diagnosis not present

## 2022-11-19 ENCOUNTER — Ambulatory Visit: Payer: No Typology Code available for payment source

## 2022-11-20 ENCOUNTER — Ambulatory Visit
Admission: RE | Admit: 2022-11-20 | Discharge: 2022-11-20 | Disposition: A | Discharge status: Home / Self Care | Payer: Medicare HMO | Source: Ambulatory Visit | Attending: Registered Nurse | Admitting: Registered Nurse

## 2022-11-20 DIAGNOSIS — Z1231 Encounter for screening mammogram for malignant neoplasm of breast: Secondary | ICD-10-CM

## 2022-11-24 DIAGNOSIS — C3490 Malignant neoplasm of unspecified part of unspecified bronchus or lung: Secondary | ICD-10-CM | POA: Diagnosis not present

## 2022-11-24 DIAGNOSIS — R7303 Prediabetes: Secondary | ICD-10-CM | POA: Diagnosis not present

## 2022-11-24 DIAGNOSIS — K219 Gastro-esophageal reflux disease without esophagitis: Secondary | ICD-10-CM | POA: Diagnosis not present

## 2022-11-24 DIAGNOSIS — I700 Atherosclerosis of aorta: Secondary | ICD-10-CM | POA: Diagnosis not present

## 2022-11-24 DIAGNOSIS — E559 Vitamin D deficiency, unspecified: Secondary | ICD-10-CM | POA: Diagnosis not present

## 2022-11-24 DIAGNOSIS — Z86010 Personal history of colonic polyps: Secondary | ICD-10-CM | POA: Diagnosis not present

## 2022-11-24 DIAGNOSIS — I10 Essential (primary) hypertension: Secondary | ICD-10-CM | POA: Diagnosis not present

## 2022-11-24 DIAGNOSIS — Z0000 Encounter for general adult medical examination without abnormal findings: Secondary | ICD-10-CM | POA: Diagnosis not present

## 2022-11-24 DIAGNOSIS — J449 Chronic obstructive pulmonary disease, unspecified: Secondary | ICD-10-CM | POA: Diagnosis not present

## 2022-11-29 DIAGNOSIS — I10 Essential (primary) hypertension: Secondary | ICD-10-CM | POA: Diagnosis not present

## 2022-11-29 DIAGNOSIS — M1990 Unspecified osteoarthritis, unspecified site: Secondary | ICD-10-CM | POA: Diagnosis not present

## 2022-11-29 DIAGNOSIS — J449 Chronic obstructive pulmonary disease, unspecified: Secondary | ICD-10-CM | POA: Diagnosis not present

## 2023-01-27 DIAGNOSIS — H04123 Dry eye syndrome of bilateral lacrimal glands: Secondary | ICD-10-CM | POA: Diagnosis not present

## 2023-01-27 DIAGNOSIS — H11043 Peripheral pterygium, stationary, bilateral: Secondary | ICD-10-CM | POA: Diagnosis not present

## 2023-01-27 DIAGNOSIS — Z961 Presence of intraocular lens: Secondary | ICD-10-CM | POA: Diagnosis not present

## 2023-01-27 DIAGNOSIS — H1045 Other chronic allergic conjunctivitis: Secondary | ICD-10-CM | POA: Diagnosis not present

## 2023-01-27 DIAGNOSIS — H43812 Vitreous degeneration, left eye: Secondary | ICD-10-CM | POA: Diagnosis not present

## 2023-01-29 DIAGNOSIS — I10 Essential (primary) hypertension: Secondary | ICD-10-CM | POA: Diagnosis not present

## 2023-01-29 DIAGNOSIS — M810 Age-related osteoporosis without current pathological fracture: Secondary | ICD-10-CM | POA: Diagnosis not present

## 2023-04-01 ENCOUNTER — Inpatient Hospital Stay: Payer: Medicare HMO | Attending: Internal Medicine

## 2023-04-01 ENCOUNTER — Other Ambulatory Visit: Payer: Self-pay

## 2023-04-01 DIAGNOSIS — Z79899 Other long term (current) drug therapy: Secondary | ICD-10-CM | POA: Insufficient documentation

## 2023-04-01 DIAGNOSIS — I10 Essential (primary) hypertension: Secondary | ICD-10-CM | POA: Insufficient documentation

## 2023-04-01 DIAGNOSIS — Z85118 Personal history of other malignant neoplasm of bronchus and lung: Secondary | ICD-10-CM | POA: Insufficient documentation

## 2023-04-01 DIAGNOSIS — Z923 Personal history of irradiation: Secondary | ICD-10-CM | POA: Diagnosis not present

## 2023-04-01 DIAGNOSIS — C3490 Malignant neoplasm of unspecified part of unspecified bronchus or lung: Secondary | ICD-10-CM

## 2023-04-01 LAB — CMP (CANCER CENTER ONLY)
ALT: 10 U/L (ref 0–44)
AST: 16 U/L (ref 15–41)
Albumin: 4 g/dL (ref 3.5–5.0)
Alkaline Phosphatase: 86 U/L (ref 38–126)
Anion gap: 6 (ref 5–15)
BUN: 7 mg/dL — ABNORMAL LOW (ref 8–23)
CO2: 34 mmol/L — ABNORMAL HIGH (ref 22–32)
Calcium: 9.6 mg/dL (ref 8.9–10.3)
Chloride: 102 mmol/L (ref 98–111)
Creatinine: 0.66 mg/dL (ref 0.44–1.00)
GFR, Estimated: >60 mL/min (ref >60)
Glucose, Bld: 100 mg/dL — ABNORMAL HIGH (ref 70–99)
Potassium: 4.1 mmol/L (ref 3.5–5.1)
Sodium: 142 mmol/L (ref 135–145)
Total Bilirubin: 0.6 mg/dL (ref 0.3–1.2)
Total Protein: 7.2 g/dL (ref 6.5–8.1)

## 2023-04-01 LAB — CBC WITH DIFFERENTIAL (CANCER CENTER ONLY)
Abs Immature Granulocytes: 0.01 10*3/uL (ref 0.00–0.07)
Basophils Absolute: 0 10*3/uL (ref 0.0–0.1)
Basophils Relative: 1 %
Eosinophils Absolute: 0.1 10*3/uL (ref 0.0–0.5)
Eosinophils Relative: 2 %
HCT: 35.2 % — ABNORMAL LOW (ref 36.0–46.0)
Hemoglobin: 11.5 g/dL — ABNORMAL LOW (ref 12.0–15.0)
Immature Granulocytes: 0 %
Lymphocytes Relative: 37 %
Lymphs Abs: 1.5 10*3/uL (ref 0.7–4.0)
MCH: 29.6 pg (ref 26.0–34.0)
MCHC: 32.7 g/dL (ref 30.0–36.0)
MCV: 90.5 fL (ref 80.0–100.0)
Monocytes Absolute: 0.4 10*3/uL (ref 0.1–1.0)
Monocytes Relative: 9 %
Neutro Abs: 2.1 10*3/uL (ref 1.7–7.7)
Neutrophils Relative %: 51 %
Platelet Count: 199 10*3/uL (ref 150–400)
RBC: 3.89 MIL/uL (ref 3.87–5.11)
RDW: 15.8 % — ABNORMAL HIGH (ref 11.5–15.5)
WBC Count: 4.1 10*3/uL (ref 4.0–10.5)
nRBC: 0 % (ref 0.0–0.2)

## 2023-04-02 ENCOUNTER — Ambulatory Visit (HOSPITAL_COMMUNITY)
Admission: RE | Admit: 2023-04-02 | Discharge: 2023-04-02 | Disposition: A | Discharge status: Home / Self Care | Payer: Medicare HMO | Source: Ambulatory Visit | Attending: Internal Medicine | Admitting: Internal Medicine

## 2023-04-02 DIAGNOSIS — C3490 Malignant neoplasm of unspecified part of unspecified bronchus or lung: Secondary | ICD-10-CM | POA: Insufficient documentation

## 2023-04-02 DIAGNOSIS — I700 Atherosclerosis of aorta: Secondary | ICD-10-CM | POA: Diagnosis not present

## 2023-04-02 MED ORDER — SODIUM CHLORIDE (PF) 0.9 % IJ SOLN
INTRAMUSCULAR | Status: AC
  Filled 2023-04-02: qty 50

## 2023-04-02 MED ORDER — IOHEXOL 300 MG/ML  SOLN
75.0000 mL | Freq: Once | INTRAMUSCULAR | Status: AC | PRN
  Administered 2023-04-02: 75 mL via INTRAVENOUS

## 2023-04-05 ENCOUNTER — Other Ambulatory Visit: Payer: Self-pay

## 2023-04-05 ENCOUNTER — Inpatient Hospital Stay (HOSPITAL_BASED_OUTPATIENT_CLINIC_OR_DEPARTMENT_OTHER): Payer: Medicare HMO | Admitting: Internal Medicine

## 2023-04-05 VITALS — BP 191/115 | HR 92 | Temp 98.3°F | Resp 18 | Ht 63.0 in | Wt 159.3 lb

## 2023-04-05 DIAGNOSIS — Z85118 Personal history of other malignant neoplasm of bronchus and lung: Secondary | ICD-10-CM | POA: Diagnosis not present

## 2023-04-05 DIAGNOSIS — I10 Essential (primary) hypertension: Secondary | ICD-10-CM | POA: Diagnosis not present

## 2023-04-05 DIAGNOSIS — C3490 Malignant neoplasm of unspecified part of unspecified bronchus or lung: Secondary | ICD-10-CM

## 2023-04-05 DIAGNOSIS — Z79899 Other long term (current) drug therapy: Secondary | ICD-10-CM | POA: Diagnosis not present

## 2023-04-05 DIAGNOSIS — Z923 Personal history of irradiation: Secondary | ICD-10-CM | POA: Diagnosis not present

## 2023-04-05 NOTE — Progress Notes (Signed)
[facility] Telephone:(336) [phone]   Fax:(336) [phone]  OFFICE PROGRESS NOTE  Pcp, No No address on file  DIAGNOSIS: Stage IA (T1b, N0, M0) non-small cell lung cancer, adenocarcinoma presented with right upper lobe lung nodule diagnosed in September 2022.  PRIOR THERAPY: Status post SBRT to the right upper lobe lung nodule under the care of Dr. Mitzi Hansen completed July 09, 2021.  CURRENT THERAPY: Observation.  INTERVAL HISTORY: Heather Ellis 77 y.o. female returns to the clinic today for 6 months follow-up visit.  The patient is feeling fine today with no concerning complaints but she is currently under a lot of stress taking care of her son who is not compliant with his diabetic instruction.  Her blood pressure is so high today.  She denied having any current chest pain, shortness of breath, cough or hemoptysis.  She has no nausea, vomiting, diarrhea or constipation.  She has no headache or visual changes.  She has no recent weight loss or night sweats.  She is here today for evaluation with repeat CT scan of the chest for restaging of her disease.  MEDICAL HISTORY: Past Medical History:  Diagnosis Date   Acid reflux    Anemia    Asthma    COPD (chronic obstructive pulmonary disease) (HCC)    Dyspnea    WITH EXERTION   H/O polymyalgia rheumatica    History of COVID-19 2020   Hypertension    Non-small cell lung cancer (HCC)    Right   OA (osteoarthritis)    Obesity    Osteoporosis    Pre-diabetes    Syncope    Syncope and collapse 08/2018   Vitamin D deficiency     ALLERGIES:  is allergic to fosamax [alendronate sodium].  MEDICATIONS:  Current Outpatient Medications  Medication Sig Dispense Refill   albuterol (VENTOLIN HFA) 108 (90 Base) MCG/ACT inhaler Inhale 2 puffs into the lungs every 6 (six) hours as needed for wheezing or shortness of breath.     bisoprolol-hydrochlorothiazide (ZIAC) 5-6.25 MG tablet Take 2 tablets by mouth in the  morning.     cholecalciferol (VITAMIN D) 1000 UNITS tablet Take 1,000 Units by mouth daily in the afternoon.     folic acid (FOLVITE) 1 MG tablet Take 1 mg by mouth daily.     lisinopril (ZESTRIL) 40 MG tablet Take 40 mg by mouth daily.     meloxicam (MOBIC) 15 MG tablet Take 1 tablet (15 mg total) by mouth daily. 30 tablet 2   Menthol, Topical Analgesic, (BIOFREEZE EX) Apply 1 application topically daily as needed (soreness).     omeprazole (PRILOSEC) 20 MG capsule Take 20 mg by mouth daily as needed (acid reflux/heartburn).     ondansetron (ZOFRAN) 4 MG tablet Take 1 tablet (4 mg total) by mouth every 8 (eight) hours as needed for nausea or vomiting. 30 tablet 0   umeclidinium-vilanterol (ANORO ELLIPTA) 62.5-25 MCG/ACT AEPB Inhale 1 puff into the lungs daily. 60 each 3   No current facility-administered medications for this visit.    SURGICAL HISTORY:  Past Surgical History:  Procedure Laterality Date   ABDOMINAL HYSTERECTOMY     TOTAL ABDOMINAL HYSTERECTOMY    BRONCHIAL BIOPSY  05/12/2021   Procedure: BRONCHIAL BIOPSIES;  Surgeon: Leslye Peer, MD;  Location: [facility] ENDOSCOPY;  Service: Pulmonary;;   BRONCHIAL BRUSHINGS  05/12/2021   Procedure: BRONCHIAL BRUSHINGS;  Surgeon: Leslye Peer, MD;  Location: [facility] ENDOSCOPY;  Service: Pulmonary;;   BRONCHIAL  NEEDLE ASPIRATION BIOPSY  05/12/2021   Procedure: BRONCHIAL NEEDLE ASPIRATION BIOPSIES;  Surgeon: Leslye Peer, MD;  Location: [facility] ENDOSCOPY;  Service: Pulmonary;;   COLONOSCOPY W/ BIOPSIES  2015   ESOPHAGOGASTRODUODENOSCOPY     FIDUCIAL MARKER PLACEMENT  05/12/2021   Procedure: FIDUCIAL MARKER PLACEMENT;  Surgeon: Leslye Peer, MD;  Location: [facility] ENDOSCOPY;  Service: Pulmonary;;   INCISION AND DRAINAGE Left 06/04/2022   Procedure: INCISION AND DRAINAGE;  Surgeon: Samson Frederic, MD;  Location: [facility];  Service: Orthopedics;  Laterality: Left;   KNEE ARTHROPLASTY Left 04/22/2022   Procedure: COMPUTER ASSISTED TOTAL KNEE ARTHROPLASTY;   Surgeon: Samson Frederic, MD;  Location: [facility];  Service: Orthopedics;  Laterality: Left;   OOPHORECTOMY     BSO   REVERSE SHOULDER ARTHROPLASTY Left 10/24/2021   Procedure: REVERSE SHOULDER ARTHROPLASTY;  Surgeon: Yolonda Kida, MD;  Location: [facility];  Service: Orthopedics;  Laterality: Left;   TOTAL HIP ARTHROPLASTY Left 01/16/2020   Procedure: TOTAL HIP ARTHROPLASTY ANTERIOR APPROACH;  Surgeon: Sheral Apley, MD;  Location: [facility];  Service: Orthopedics;  Laterality: Left;   VIDEO BRONCHOSCOPY WITH ENDOBRONCHIAL NAVIGATION Right 05/12/2021   Procedure: ROBOTIC ASSISTED BRONCHOSCOPY WITH ENDOBRONCHIAL NAVIGATION;  Surgeon: Leslye Peer, MD;  Location: [facility] ENDOSCOPY;  Service: Pulmonary;  Laterality: Right;   VIDEO BRONCHOSCOPY WITH RADIAL ENDOBRONCHIAL ULTRASOUND  05/12/2021   Procedure: VIDEO BRONCHOSCOPY WITH RADIAL ENDOBRONCHIAL ULTRASOUND;  Surgeon: Leslye Peer, MD;  Location: [facility] ENDOSCOPY;  Service: Pulmonary;;    REVIEW OF SYSTEMS:  A comprehensive review of systems was negative except for: Constitutional: positive for fatigue Respiratory: positive for dyspnea on exertion   PHYSICAL EXAMINATION: General appearance: alert, cooperative, and no distress Head: Normocephalic, without obvious abnormality, atraumatic Neck: no adenopathy, no JVD, supple, symmetrical, trachea midline, and thyroid not enlarged, symmetric, no tenderness/mass/nodules Lymph nodes: Cervical, supraclavicular, and axillary nodes normal. Resp: clear to auscultation bilaterally Back: symmetric, no curvature. ROM normal. No CVA tenderness. Cardio: regular rate and rhythm, S1, S2 normal, no murmur, click, rub or gallop GI: soft, non-tender; bowel sounds normal; no masses,  no organomegaly Extremities: extremities normal, atraumatic, no cyanosis or edema  ECOG PERFORMANCE STATUS: 1 - Symptomatic but completely ambulatory  Blood pressure (!) 191/115, pulse 92, temperature 98.3 F (36.8 C), temperature  source Tympanic, resp. rate 18, height 5\' 3"  (1.6 m), weight 159 lb 4.8 oz (72.3 kg), SpO2 95%.  LABORATORY DATA: Lab Results  Component Value Date   WBC 4.1 04/01/2023   HGB 11.5 (L) 04/01/2023   HCT 35.2 (L) 04/01/2023   MCV 90.5 04/01/2023   PLT 199 04/01/2023      Chemistry      Component Value Date/Time   NA 142 04/01/2023 1015   NA 146 (H) 10/26/2018 0816   K 4.1 04/01/2023 1015   CL 102 04/01/2023 1015   CO2 34 (H) 04/01/2023 1015   BUN 7 (L) 04/01/2023 1015   BUN 17 10/26/2018 0816   CREATININE 0.66 04/01/2023 1015      Component Value Date/Time   CALCIUM 9.6 04/01/2023 1015   ALKPHOS 86 04/01/2023 1015   AST 16 04/01/2023 1015   ALT 10 04/01/2023 1015   BILITOT 0.6 04/01/2023 1015       RADIOGRAPHIC STUDIES: CT Chest W Contrast  Result Date: 04/05/2023 CLINICAL DATA:  Non-small-cell lung cancer. Restaging. * Tracking Code: BO * EXAM: CT CHEST WITH CONTRAST TECHNIQUE: Multidetector CT imaging of the chest was performed during intravenous contrast administration. RADIATION DOSE REDUCTION:  This exam was performed according to the departmental dose-optimization program which includes automated exposure control, adjustment of the mA and/or kV according to patient size and/or use of iterative reconstruction technique. CONTRAST:  75mL OMNIPAQUE IOHEXOL 300 MG/ML  SOLN COMPARISON:  10/02/2022 FINDINGS: Cardiovascular: The heart size is normal. No substantial pericardial effusion. Stable prominence of the ascending thoracic aorta at 3.8 cm diameter. Mild atherosclerotic calcification is noted in the wall of the thoracic aorta. Mediastinum/Nodes: No mediastinal lymphadenopathy. There is no hilar lymphadenopathy. The esophagus has normal imaging features. There is no axillary lymphadenopathy. Distal esophageal wall thickening seen previously is not evident today and no appreciable hiatal hernia on the current study. Lungs/Pleura: Right parahilar post treatment scarring incorporates  a localization marker, stable. 7 mm right lower lobe nodule on 81/6 was 6 mm previously. 4 mm left lower lobe nodule on 98/6 is stable. Subtle ground-glass opacity peripheral left upper lobe on 30/6 is unchanged. No new suspicious pulmonary nodule or mass. No focal airspace consolidation. No pleural effusion. Upper Abdomen: Visualized portion of the upper abdomen is unremarkable. Musculoskeletal: No worrisome lytic or sclerotic osseous abnormality. Nonacute fractures lateral right fifth and sixth ribs are similar to prior, presumably treatment related. No suspicious lytic or sclerotic osseous abnormality. IMPRESSION: 1. Stable exam. No new or progressive findings to suggest recurrent or metastatic disease. 2. Stable right parahilar post treatment scarring. 3. Stable bilateral small solid and ground-glass nodules. Likely benign, continued attention on follow-up warranted. 4.  Aortic Atherosclerosis (ICD10-I70.0). Electronically Signed   By: Kennith Center M.D.   On: 04/05/2023 10:42    ASSESSMENT AND PLAN: This is a very pleasant 77 years old African-American female diagnosed with a stage IA (T1b, N0, M0) non-small cell lung cancer, adenocarcinoma in September 2022 The patient is status post SBRT to the right upper lobe lung nodule completed in November 2022. She has been on observation since that time and the patient is feeling fine with no concerning complaints except for the mild fatigue. She had repeat CT scan of the chest performed recently.  I personally and independently reviewed the scan and discussed the result with the patient today. Her scan showed no concerning findings for disease recurrence or metastasis. I recommended for her to continue on observation with repeat CT scan of the chest in 1 year. For the hypertension she was strongly advised to take her blood pressure medications and to monitor it closely at home.  She will reach out to her primary care provider for adjustment of her medication  if needed. The patient voices understanding of current disease status and treatment options and is in agreement with the current care plan.  All questions were answered. The patient knows to call the clinic with any problems, questions or concerns. We can certainly see the patient much sooner if necessary.  The total time spent in the appointment was 20 minutes.  Disclaimer: This note was dictated with voice recognition software. Similar sounding words can inadvertently be transcribed and may not be corrected upon review.

## 2023-05-11 ENCOUNTER — Ambulatory Visit (INDEPENDENT_AMBULATORY_CARE_PROVIDER_SITE_OTHER): Payer: Medicare HMO | Admitting: Emergency Medicine

## 2023-05-11 ENCOUNTER — Encounter: Payer: Self-pay | Admitting: Emergency Medicine

## 2023-05-11 VITALS — BP 170/120 | HR 88 | Ht 63.0 in | Wt 162.4 lb

## 2023-05-11 DIAGNOSIS — C3491 Malignant neoplasm of unspecified part of right bronchus or lung: Secondary | ICD-10-CM

## 2023-05-11 DIAGNOSIS — J449 Chronic obstructive pulmonary disease, unspecified: Secondary | ICD-10-CM

## 2023-05-11 NOTE — Assessment & Plan Note (Signed)
Doing well post SBRT.  Reassuring CT chest 04/02/2023.  She is following serial scans with Dr. Arbutus Ped.

## 2023-05-11 NOTE — Progress Notes (Signed)
Subjective:    Patient ID: Heather Ellis, female    DOB: 12/27/1945, 77 y.o.   MRN: [id number]  HPI  ROV 04/23/21 --follow-up visit 77 year old woman carries a history of COPD/asthma.  Past medical history also significant for polymyalgia rheumatica (prednisone 10 mg), hypertension, GERD, obesity. I saw her earlier this month for an abnormal CT scan of the chest that showed a 1.8 cm right upper lobe pulmonary nodule 5 mm right lower lobe nodule, 9 mm groundglass left upper lobe nodule.  Based on this we performed a PET scan as below.  She also had pulmonary function testing. She has SABA, but rarely needs it. She believes that her breathing is pretty good unless she is significantly exerting - stairs, etc.   PFT today reviewed by me show evidence for severe obstruction without a bronchodilator response, coexisting restriction based on lung volumes, decreased diffusion capacity that corrects to the normal range when adjusted for alveolar volume.  Her flow-volume loop is consistent with obstruction.  PET scan performed 04/17/2021 reviewed by me, shows significant hypermetabolism in the right upper lobe pulmonary nodule without any evidence of mediastinal or hilar lymphadenopathy.  Some asymmetric uptake in the posterior right nasopharynx and the tonsillar region, likely physiologic  ROV 06/10/21 --this follow-up visit for patient with a history of COPD/asthma, polymyalgia rheumatica (prednisone 10 mg), hypertension, GERD.  Her pulmonary function testing shows severe obstruction without a bronchodilator response.  She underwent navigational bronchoscopy 05/12/2021 for a right upper lobe pulmonary nodule.  Pathology showed non-small cell lung cancer.  She has seen oncology and is being considered for primary resection -will see Dr. Orson Aloe tomorrow. She denies any significant dyspnea at rest but does have SOB with rapid walking or stairs. Has been active. Rarely needs to use albuterol. Has not been on  a scheduled BD, wants to hold off on this.   Her pulmonary function testing shows an FEV1 of 0.69 L (43% predicted)  ROV 05/11/2023 --Heather Ellis is a 70 with a history of asthmatic COPD, PMR (prednisone 10 mg once daily), hypertension, GERD, right upper lobe non-small cell lung cancer (05/12/2021) for which she underwent SBRT by Dr. Mitzi Hansen.  CT chest done 04/02/2023 as below Currently managed on Anoro, has albuterol which she uses approximately Today she reports that she gets exertional SOB after walking about 1/2 a block. She can shop, uses a cane. Sometimes has to stop to rest. Rare cough, much less than in the past. No flares or ED visits.  She uses albuterol about 2x a week. She wants the flu shot, will get from her PCP next week.   CT scan of the chest 04/02/2023 reviewed by me, shows posttreatment changes in the right perihilar region, stable 7 mm right lower lobe nodule, stable 4 mm left lower lobe nodule, subtle left upper lobe groundglass unchanged.  No new findings or any evidence of recurrence.   Review of Systems As per HPI     Objective:   Physical Exam Vitals:   05/11/23 1512  BP: (!) 170/120  Pulse: 88  SpO2: 96%  Weight: 162 lb 6.4 oz (73.7 kg)  Height: 5\' 3"  (1.6 m)     Gen: Pleasant elderly woman, overweight,  in no distress,  normal affect  ENT: No lesions,  mouth clear,  oropharynx clear, no postnasal drip  Neck: No JVD, no stridor  Lungs: No use of accessory muscles, no crackles or wheezing on normal respiration, no wheeze on forced expiration  Cardiovascular: RRR, heart  sounds normal, no murmur or gallops, no peripheral edema  Musculoskeletal: No deformities, no cyanosis or clubbing  Neuro: alert, awake, non focal  Skin: Warm, no lesions or rash      Assessment & Plan:  Adenocarcinoma of right lung, stage 1 (HCC) Doing well post SBRT.  Reassuring CT chest 04/02/2023.  She is following serial scans with Dr. Arbutus Ped.  COPD, very severe (HCC) Overall doing quite  well especially given her degree of obstructive lung disease.  Plan to continue Anoro, albuterol as needed   Continue your Anoro once daily. Keep your albuterol available to use 2 puffs when needed for shortness of breath, chest tightness, wheezing. Get your flu shot next week as planned Consider getting the COVID-19 vaccine this fall Follow with Dr. Delton Coombes in 12 months or sooner if you have any problems.     Levy Pupa, MD, PhD 05/11/2023, 3:27 PM [facility] [phone] or if no answer before 7:00PM call [phone] For any issues after 7:00PM please call eLink [phone]

## 2023-05-11 NOTE — Assessment & Plan Note (Signed)
Overall doing quite well especially given her degree of obstructive lung disease.  Plan to continue Anoro, albuterol as needed   Continue your Anoro once daily. Keep your albuterol available to use 2 puffs when needed for shortness of breath, chest tightness, wheezing. Get your flu shot next week as planned Consider getting the COVID-19 vaccine this fall Follow with Dr. Delton Coombes in 12 months or sooner if you have any problems.

## 2023-05-11 NOTE — Patient Instructions (Signed)
We reviewed your CT scan of the chest from 04/02/2023 today.  Good news. Get your repeat CT chest next year as arranged by Dr. Arbutus Ped and follow with him as planned. Continue your Anoro once daily. Keep your albuterol available to use 2 puffs when needed for shortness of breath, chest tightness, wheezing. Get your flu shot next week as planned Consider getting the COVID-19 vaccine this fall Follow with Dr. Delton Coombes in 12 months or sooner if you have any problems.

## 2023-05-20 DIAGNOSIS — R7303 Prediabetes: Secondary | ICD-10-CM | POA: Diagnosis not present

## 2023-05-20 DIAGNOSIS — E559 Vitamin D deficiency, unspecified: Secondary | ICD-10-CM | POA: Diagnosis not present

## 2023-05-26 DIAGNOSIS — R079 Chest pain, unspecified: Secondary | ICD-10-CM | POA: Diagnosis not present

## 2023-05-26 DIAGNOSIS — I469 Cardiac arrest, cause unspecified: Secondary | ICD-10-CM | POA: Diagnosis not present

## 2023-05-26 DIAGNOSIS — R404 Transient alteration of awareness: Secondary | ICD-10-CM | POA: Diagnosis not present

## 2023-05-26 DIAGNOSIS — I499 Cardiac arrhythmia, unspecified: Secondary | ICD-10-CM | POA: Diagnosis not present

## 2023-05-26 DIAGNOSIS — Z743 Need for continuous supervision: Secondary | ICD-10-CM | POA: Diagnosis not present

## 2023-06-01 DEATH — deceased

## 2023-08-16 IMAGING — MR MR HEAD WO/W CM
13 series · 48 of 48 positions shown · IV contrast (gadavist)
Comparison: Brain MRI 11/22/2018

CLINICAL DATA: Non-small-cell lung cancer, staging

EXAM:
MRI HEAD WITHOUT AND WITH CONTRAST
TECHNIQUE: Multiplanar, multiecho pulse sequences of the brain and surrounding
structures were obtained without and with intravenous contrast.
CONTRAST:  7mL GADAVIST GADOBUTROL 1 MMOL/ML IV SOLN

[Series 5: DWI · axial · 3.0mm · 1.36mm/px · z∈[-11,+129]mm · 6 of 96 slices shown (1 of 2)]
[im 1/96]
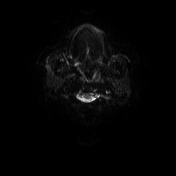
[im 20/96]
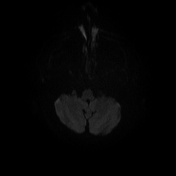
[im 39/96]
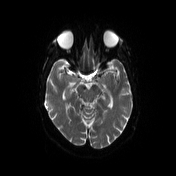
[im 58/96]
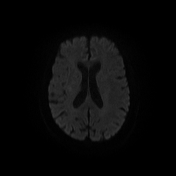
[im 77/96]
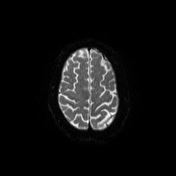
[im 96/96]
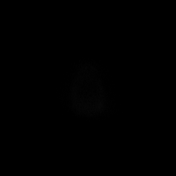

[Series 6: DWI · axial · 3.0mm · 1.36mm/px · z∈[-11,+129]mm · 3 of 48 slices shown (2 of 2)]
[im 1/48]
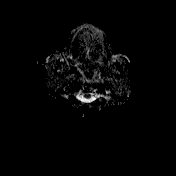
[im 24/48]
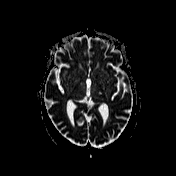
[im 48/48]
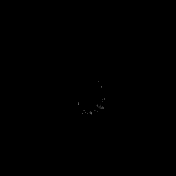

[Series 7: T1 · sagittal · 5.0mm · 0.75mm/px · 2 of 24 slices shown (1 of 2)]
[im 1/24]
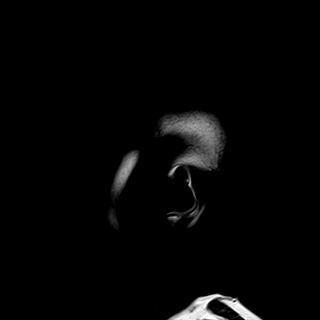
[im 24/24]
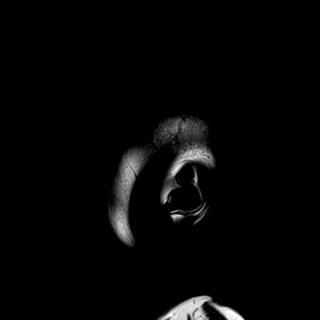

[Series 8: T2 · axial · 5.0mm · 0.62mm/px · z∈[-12,+131]mm · 2 of 23 slices shown]
[im 1/23]
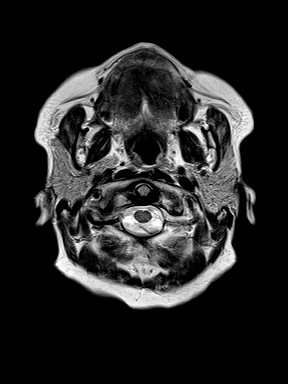
[im 23/23]
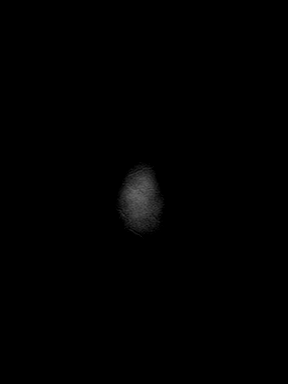

[Series 9: swi_images · axial · 3.0mm · 0.75mm/px · z∈[-11,+129]mm · 3 of 48 slices shown]
[im 1/48]
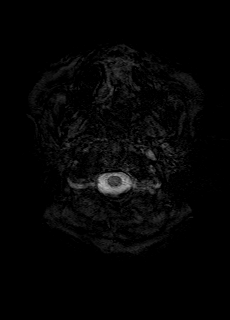
[im 24/48]
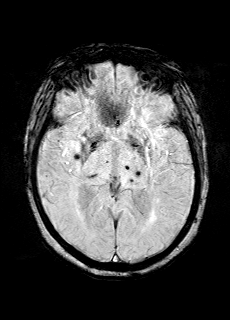
[im 48/48]
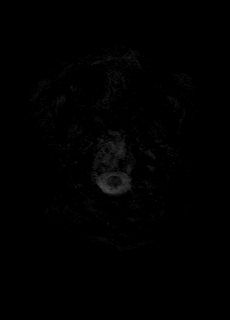

[Series 11: FLAIR · axial · 3.0mm · 0.75mm/px · z∈[-14,+133]mm · 3 of 50 slices shown]
[im 1/50]
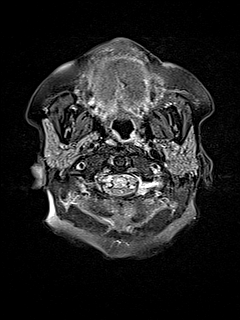
[im 25/50]
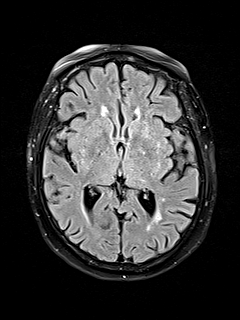
[im 50/50]
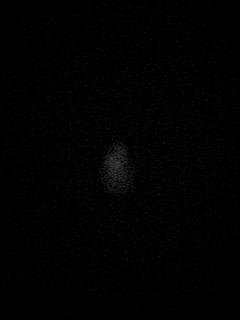

[Series 12: T1 · axial · 1.0mm · 0.94mm/px · z∈[-14,+128]mm · 9 of 144 slices shown (2 of 2)]
[im 1/144]
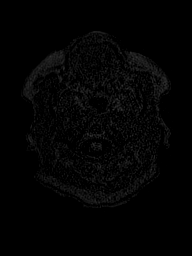
[im 18/144]
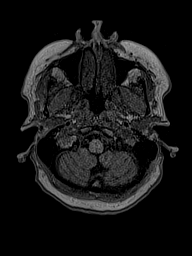
[im 36/144]
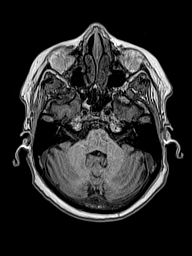
[im 54/144]
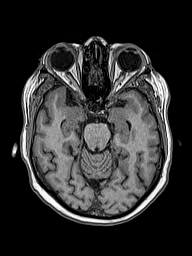
[im 72/144]
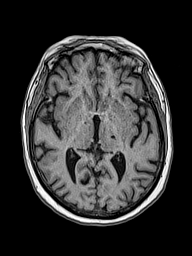
[im 90/144]
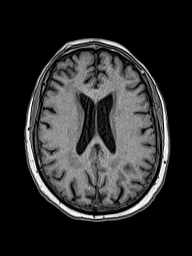
[im 108/144]
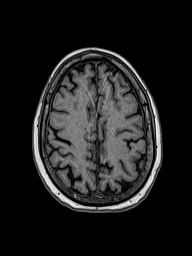
[im 126/144]
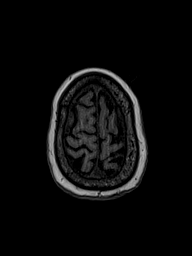
[im 144/144]
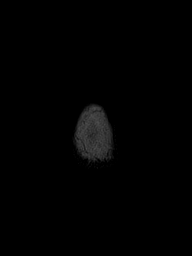

[Series 13: cor dwi_tracew · coronal · 5.0mm · 1.53mm/px · 3 of 52 slices shown]
[im 1/52]
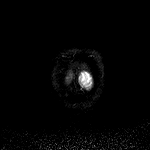
[im 26/52]
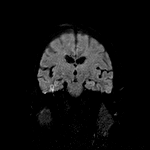
[im 52/52]
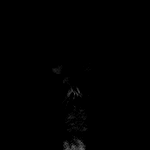

[Series 14: cor dwi_adc · coronal · 5.0mm · 1.53mm/px · 2 of 26 slices shown]
[im 1/26]
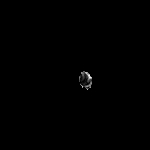
[im 26/26]
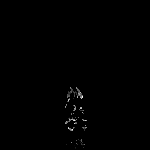

[Series 15: T2 post-contrast · coronal · 5.0mm · 0.57mm/px · 2 of 26 slices shown]
[im 1/26]
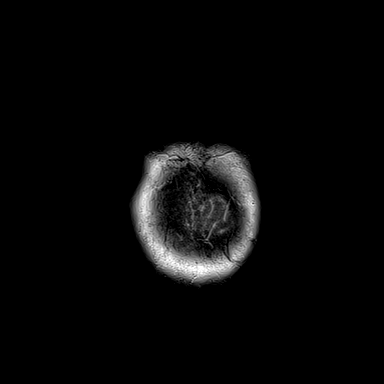
[im 26/26]
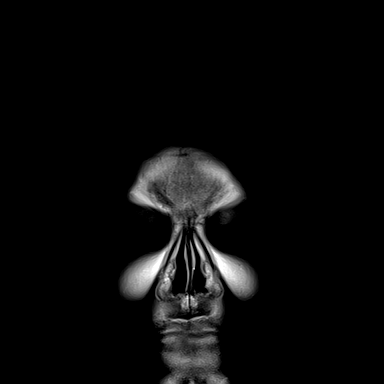

[Series 16: T1 post-contrast · axial · 1.0mm · 0.94mm/px · z∈[-14,+128]mm · 9 of 144 slices shown (1 of 3)]
[im 1/144]
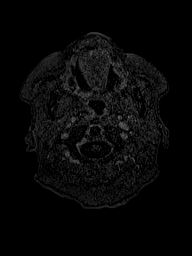
[im 18/144]
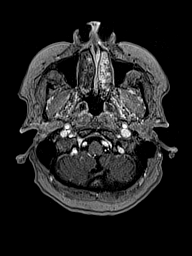
[im 36/144]
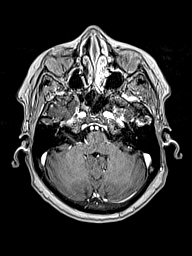
[im 54/144]
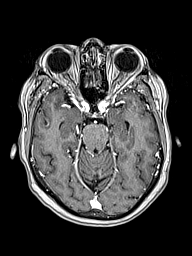
[im 72/144]
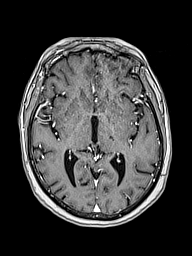
[im 90/144]
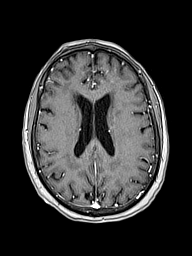
[im 108/144]
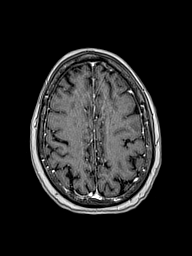
[im 126/144]
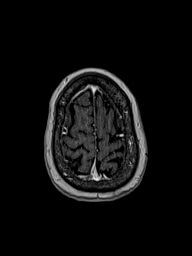
[im 144/144]
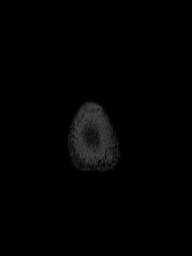

[Series 17: T1 post-contrast · coronal · 5.0mm · 0.43mm/px · 2 of 26 slices shown (2 of 3)]
[im 1/26]
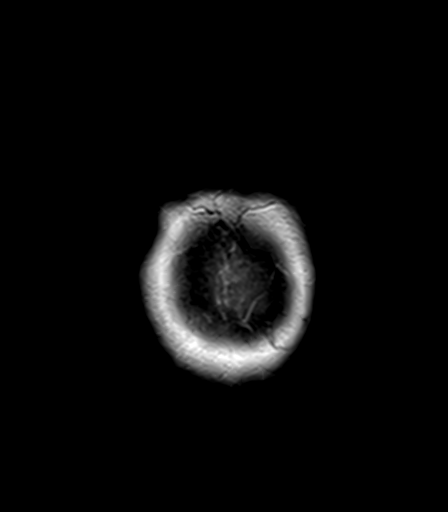
[im 26/26]
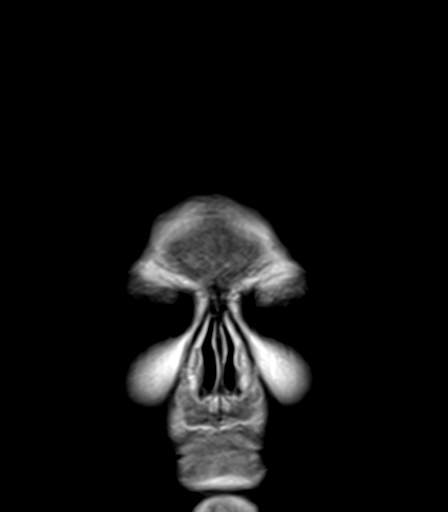

[Series 18: T1 post-contrast · sagittal · 5.0mm · 0.75mm/px · 2 of 24 slices shown (3 of 3)]
[im 1/24]
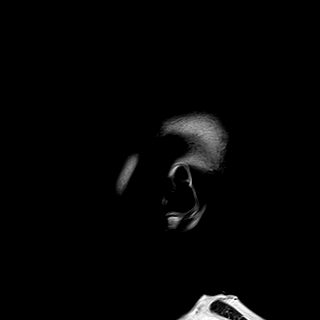
[im 24/24]
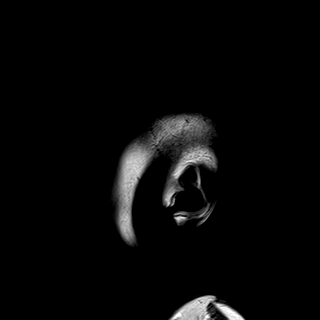

[48 of 48 positions shown; findings below may reference images not displayed]

FINDINGS: Brain: There is no evidence of acute intracranial hemorrhage,
extra-axial fluid collection, or acute infarct.

There is mild parenchymal volume loss with commensurate enlargement
of the ventricular system, unchanged. Confluent areas of FLAIR
signal abnormality in the subcortical and periventricular white
matter are nonspecific but likely reflect sequela of chronic white
matter microangiopathy, unchanged. There is a remote lacunar infarct
in the right thalamus, unchanged. Multiple punctate chronic
microhemorrhages are seen in the brainstem and basal ganglia in a
distribution suggesting hypertensive etiology, unchanged.

There is no mass lesion. There is no abnormal enhancement. There is
no midline shift.

Vascular: Normal flow voids.

Skull and upper cervical spine: Normal marrow signal.

Sinuses/Orbits: The paranasal sinuses are clear. Bilateral lens
implants are in place. The globes and orbits are otherwise
unremarkable.

Other: None.
IMPRESSION: 1. No evidence of intracranial metastatic disease.
2. Stable parenchymal volume loss, remote right thalamic lacunar
infarct, and chronic white matter microangiopathy.
3. Unchanged foci of chronic microhemorrhage in a distribution
suggesting hypertensive etiology.

## 2024-03-28 ENCOUNTER — Inpatient Hospital Stay: Payer: Medicare HMO | Attending: Internal Medicine

## 2024-04-04 ENCOUNTER — Inpatient Hospital Stay: Payer: Medicare HMO | Attending: Internal Medicine | Admitting: Internal Medicine
